# Patient Record
Sex: Male | Born: 1935 | Race: White | Hispanic: No | Marital: Married | Smoking: Never smoker
Health system: Southern US, Community
[De-identification: ages and names within clinical notes are randomized; demographics above are authoritative.]

## PROBLEM LIST (undated history)

## (undated) DIAGNOSIS — E119 Type 2 diabetes mellitus without complications: Secondary | ICD-10-CM

## (undated) DIAGNOSIS — I10 Essential (primary) hypertension: Secondary | ICD-10-CM

## (undated) DIAGNOSIS — F0390 Unspecified dementia without behavioral disturbance: Secondary | ICD-10-CM

## (undated) DIAGNOSIS — I639 Cerebral infarction, unspecified: Secondary | ICD-10-CM

## (undated) HISTORY — PX: OTHER SURGICAL HISTORY: SHX169

---

## 2018-01-14 ENCOUNTER — Encounter: Payer: Self-pay | Admitting: Podiatry

## 2018-01-14 ENCOUNTER — Ambulatory Visit (INDEPENDENT_AMBULATORY_CARE_PROVIDER_SITE_OTHER): Payer: Medicare Other | Admitting: Podiatry

## 2018-01-14 VITALS — BP 155/78 | HR 81

## 2018-01-14 DIAGNOSIS — M79675 Pain in left toe(s): Secondary | ICD-10-CM

## 2018-01-14 DIAGNOSIS — E119 Type 2 diabetes mellitus without complications: Secondary | ICD-10-CM | POA: Diagnosis not present

## 2018-01-14 DIAGNOSIS — M79674 Pain in right toe(s): Secondary | ICD-10-CM | POA: Diagnosis not present

## 2018-01-14 DIAGNOSIS — B351 Tinea unguium: Secondary | ICD-10-CM | POA: Diagnosis not present

## 2018-01-14 NOTE — Progress Notes (Signed)
This patient presents to the office with chief complaint of long thick nails and diabetic feet.  This patient  says there  is  no pain and discomfort in their feet.  This patient says there are long thick painful nails.  These nails are painful walking and wearing shoes.  Patient has no history of infection or drainage from both feet.  Patient is unable to  self treat his own nails . This patient presents  to the office today for treatment of the  long nails and a foot evaluation due to history of  diabetes.  General Appearance  Alert, conversant and in no acute stress.  Vascular  Dorsalis pedis and posterior tibial  pulses are palpable  bilaterally.  Capillary return is within normal limits  bilaterally. Temperature is within normal limits  bilaterally.  Neurologic  Senn-Weinstein monofilament wire test within normal limits  bilaterally. Muscle power within normal limits bilaterally.  Nails Thick disfigured discolored nails with subungual debris  from hallux to fifth toes bilaterally. No evidence of bacterial infection or drainage bilaterally.  Orthopedic  No limitations of motion of motion feet .  No crepitus or effusions noted.  No bony pathology or digital deformities noted.  Skin  normotropic skin with no porokeratosis noted bilaterally.  No signs of infections or ulcers noted.     Onychomycosis  Diabetes with no foot complications  IE  Debride nails x 10.  A diabetic foot exam was performed and there is no evidence of any vascular or neurologic pathology.   RTC 10 weeks   Gardiner Barefoot DPM

## 2018-03-25 ENCOUNTER — Ambulatory Visit: Payer: 59 | Admitting: Podiatry

## 2018-04-10 ENCOUNTER — Observation Stay
Admission: EM | Admit: 2018-04-10 | Discharge: 2018-04-11 | Disposition: A | Discharge status: Home / Self Care | Payer: Medicare Other | Attending: Internal Medicine | Admitting: Internal Medicine

## 2018-04-10 ENCOUNTER — Emergency Department: Payer: Medicare Other

## 2018-04-10 ENCOUNTER — Other Ambulatory Visit: Payer: Self-pay

## 2018-04-10 ENCOUNTER — Observation Stay: Payer: Medicare Other

## 2018-04-10 DIAGNOSIS — E785 Hyperlipidemia, unspecified: Secondary | ICD-10-CM | POA: Insufficient documentation

## 2018-04-10 DIAGNOSIS — G459 Transient cerebral ischemic attack, unspecified: Secondary | ICD-10-CM | POA: Diagnosis not present

## 2018-04-10 DIAGNOSIS — I10 Essential (primary) hypertension: Secondary | ICD-10-CM | POA: Diagnosis not present

## 2018-04-10 DIAGNOSIS — G319 Degenerative disease of nervous system, unspecified: Secondary | ICD-10-CM | POA: Insufficient documentation

## 2018-04-10 DIAGNOSIS — Z66 Do not resuscitate: Secondary | ICD-10-CM | POA: Diagnosis not present

## 2018-04-10 DIAGNOSIS — Z79899 Other long term (current) drug therapy: Secondary | ICD-10-CM | POA: Diagnosis not present

## 2018-04-10 DIAGNOSIS — E119 Type 2 diabetes mellitus without complications: Secondary | ICD-10-CM | POA: Insufficient documentation

## 2018-04-10 DIAGNOSIS — F0390 Unspecified dementia without behavioral disturbance: Secondary | ICD-10-CM | POA: Diagnosis not present

## 2018-04-10 DIAGNOSIS — Z7982 Long term (current) use of aspirin: Secondary | ICD-10-CM | POA: Diagnosis not present

## 2018-04-10 DIAGNOSIS — R262 Difficulty in walking, not elsewhere classified: Secondary | ICD-10-CM | POA: Diagnosis not present

## 2018-04-10 DIAGNOSIS — I350 Nonrheumatic aortic (valve) stenosis: Secondary | ICD-10-CM | POA: Diagnosis not present

## 2018-04-10 DIAGNOSIS — Z794 Long term (current) use of insulin: Secondary | ICD-10-CM | POA: Diagnosis not present

## 2018-04-10 HISTORY — DX: Essential (primary) hypertension: I10

## 2018-04-10 HISTORY — DX: Type 2 diabetes mellitus without complications: E11.9

## 2018-04-10 HISTORY — DX: Unspecified dementia, unspecified severity, without behavioral disturbance, psychotic disturbance, mood disturbance, and anxiety: F03.90

## 2018-04-10 LAB — GLUCOSE, CAPILLARY
GLUCOSE-CAPILLARY: 103 mg/dL — AB (ref 70–99)
GLUCOSE-CAPILLARY: 116 mg/dL — AB (ref 70–99)
GLUCOSE-CAPILLARY: 132 mg/dL — AB (ref 70–99)
Glucose-Capillary: 171 mg/dL — ABNORMAL HIGH (ref 70–99)

## 2018-04-10 LAB — COMPREHENSIVE METABOLIC PANEL
ALT: 15 U/L (ref 0–44)
ANION GAP: 12 (ref 5–15)
AST: 27 U/L (ref 15–41)
Albumin: 4.1 g/dL (ref 3.5–5.0)
Alkaline Phosphatase: 77 U/L (ref 38–126)
BUN: 24 mg/dL — AB (ref 8–23)
CHLORIDE: 101 mmol/L (ref 98–111)
CO2: 26 mmol/L (ref 22–32)
Calcium: 9.1 mg/dL (ref 8.9–10.3)
Creatinine, Ser: 1.72 mg/dL — ABNORMAL HIGH (ref 0.61–1.24)
GFR calc Af Amer: 41 mL/min — ABNORMAL LOW (ref >60)
GFR, EST NON AFRICAN AMERICAN: 35 mL/min — AB (ref >60)
Glucose, Bld: 117 mg/dL — ABNORMAL HIGH (ref 70–99)
POTASSIUM: 4.2 mmol/L (ref 3.5–5.1)
Sodium: 139 mmol/L (ref 135–145)
Total Bilirubin: 0.5 mg/dL (ref 0.3–1.2)
Total Protein: 7.1 g/dL (ref 6.5–8.1)

## 2018-04-10 LAB — PROTIME-INR
INR: 0.91
Prothrombin Time: 12.2 seconds (ref 11.4–15.2)

## 2018-04-10 LAB — DIFFERENTIAL
BASOS ABS: 0.1 10*3/uL (ref 0–0.1)
BASOS PCT: 1 %
EOS ABS: 0.4 10*3/uL (ref 0–0.7)
EOS PCT: 4 %
Lymphocytes Relative: 24 %
Lymphs Abs: 2.2 10*3/uL (ref 1.0–3.6)
MONOS PCT: 8 %
Monocytes Absolute: 0.8 10*3/uL (ref 0.2–1.0)
Neutro Abs: 5.9 10*3/uL (ref 1.4–6.5)
Neutrophils Relative %: 63 %

## 2018-04-10 LAB — CBC
HEMATOCRIT: 44.5 % (ref 40.0–52.0)
Hemoglobin: 15.3 g/dL (ref 13.0–18.0)
MCH: 30.3 pg (ref 26.0–34.0)
MCHC: 34.4 g/dL (ref 32.0–36.0)
MCV: 88.1 fL (ref 80.0–100.0)
Platelets: 278 10*3/uL (ref 150–440)
RBC: 5.05 MIL/uL (ref 4.40–5.90)
RDW: 14.5 % (ref 11.5–14.5)
WBC: 9.3 10*3/uL (ref 3.8–10.6)

## 2018-04-10 LAB — APTT: APTT: 32 s (ref 24–36)

## 2018-04-10 LAB — TROPONIN I

## 2018-04-10 MED ORDER — ASPIRIN 300 MG RE SUPP: 300.0000 mg | Freq: Every day | RECTAL | Status: DC

## 2018-04-10 MED ORDER — ACETAMINOPHEN 160 MG/5ML PO SOLN
650.0000 mg | ORAL | Status: DC | PRN
  Filled 2018-04-10: qty 20.3

## 2018-04-10 MED ORDER — DONEPEZIL HCL 5 MG PO TABS
10.0000 mg | ORAL_TABLET | Freq: Every day | ORAL | Status: DC
  Administered 2018-04-10: 10 mg via ORAL
  Filled 2018-04-10 (×2): qty 2

## 2018-04-10 MED ORDER — INSULIN ASPART 100 UNIT/ML ~~LOC~~ SOLN
0.0000 [IU] | Freq: Three times a day (TID) | SUBCUTANEOUS | Status: DC
  Administered 2018-04-10 – 2018-04-11 (×2): 2 [IU] via SUBCUTANEOUS
  Filled 2018-04-10 (×2): qty 1

## 2018-04-10 MED ORDER — ASPIRIN 325 MG PO TABS
325.0000 mg | ORAL_TABLET | Freq: Every day | ORAL | Status: DC
  Administered 2018-04-11: 08:00:00 325 mg via ORAL
  Filled 2018-04-10: qty 1

## 2018-04-10 MED ORDER — ACETAMINOPHEN 325 MG PO TABS: 650.0000 mg | ORAL_TABLET | ORAL | Status: DC | PRN

## 2018-04-10 MED ORDER — ACETAMINOPHEN 650 MG RE SUPP: 650.0000 mg | RECTAL | Status: DC | PRN

## 2018-04-10 MED ORDER — ASPIRIN 81 MG PO CHEW
324.0000 mg | CHEWABLE_TABLET | Freq: Once | ORAL | Status: AC
  Administered 2018-04-10: 324 mg via ORAL
  Filled 2018-04-10: qty 4

## 2018-04-10 MED ORDER — STROKE: EARLY STAGES OF RECOVERY BOOK
Freq: Once | Status: AC
  Administered 2018-04-10: 19:00:00

## 2018-04-10 MED ORDER — ENOXAPARIN SODIUM 40 MG/0.4ML ~~LOC~~ SOLN
40.0000 mg | SUBCUTANEOUS | Status: DC
  Administered 2018-04-10: 20:00:00 40 mg via SUBCUTANEOUS
  Filled 2018-04-10: qty 0.4

## 2018-04-10 MED ORDER — ESCITALOPRAM OXALATE 10 MG PO TABS
5.0000 mg | ORAL_TABLET | Freq: Every day | ORAL | Status: DC
  Administered 2018-04-10 – 2018-04-11 (×2): 5 mg via ORAL
  Filled 2018-04-10 (×3): qty 0.5

## 2018-04-10 MED ORDER — SIMVASTATIN 10 MG PO TABS
20.0000 mg | ORAL_TABLET | Freq: Every day | ORAL | Status: DC
  Administered 2018-04-10 – 2018-04-11 (×2): 20 mg via ORAL
  Filled 2018-04-10 (×2): qty 2

## 2018-04-10 NOTE — ED Triage Notes (Addendum)
Family member states that when he out of car R sided facial droop,slurred speech, R leg dragging, and unsteady on feet. States called EMS but brought in by family. States symptoms lasted about 8 minutes. No symptoms noted at this time. Speech clear. Pt is HOH. Sometimes takes a few seconds to respond to questions. bilat grip strength equal. No facial droop noted. No arm or leg drift noted.

## 2018-04-10 NOTE — ED Notes (Signed)
Code stroke called at this time by this RN.

## 2018-04-10 NOTE — Consult Note (Signed)
   [facility] Tele[facility]urology Consult Services    Date of Service:   04/10/2018 15:34:34  Impression:      .  RO Acute Ischemic Stroke     .  Clinically TIA but need to r/o stroke  Comments: Etiology of TIA is unclear at this point. Could be either Small vessel or cardioembolic.  Mechanism of Stroke: Possible Thromboembolic Possible Cardioembolic Not Clear  Metrics: Last Known Well: 04/10/2018 13:30:00 [facility] Notification Time: 04/10/2018 15:34:34 Arrival Time: 04/10/2018 15:19:00 Stamp Time: 04/10/2018 15:34:34 Time First Login Attempt: 04/10/2018 15:42:00 Video Start Time: 04/10/2018 15:42:00  Symptoms: right sided weakness NIHSS Start Assessment Time: 04/10/2018 15:42:00 Patient is not a candidate for tPA. Patient was not deemed candidate for tPA thrombolytics because of nihss 0, rapidly improving, resolving. . Video End Time: 04/10/2018 16:00:00  CT head showed no acute hemorrhage or acute core infarct. CT head was reviewed.  Advanced imaging was reviewed.  ER physician notified of the decision on thrombolytics management.  Our recommendations are outlined below.  Recommendations:     .  Antiplatelet Therapy Recommended  Routine Consultation with [facility] for Follow up Care  Sign Out:     .  Discussed with Emergency Department Provider    ------------------------------------------------------------------------------  History of Present Illness:  Patient is a 84 years old Male.  Patient was brought by EMS for symptoms of right sided weakness  84 yo M with h/o HTN HLD DM Dementia p/w transient right sided weakness. Patient was with family when they noted at 1330 symptoms of right facial droop, right arm weakness and dragging right leg. EMS called and symptoms resolved priro to their arrival. Family decided to bring to tED for evaluation. He has some mild slurred speech but son at bedside said this is normal and that he is currently  behaving like normal.   CT head showed no acute hemorrhage or acute core infarct. CT head was reviewed.    Examination:  1A: Level of Consciousness - Alert; keenly responsive + 0 1B: Ask Month and Age - Both Questions Right + 0 1C: Blink Eyes & Squeeze Hands - Performs Both Tasks + 0 2: Test Horizontal Extraocular Movements - Normal + 0 3: Test Visual Fields - No Visual Loss + 0 4: Test Facial Palsy (Use Grimace if Obtunded) - Normal symmetry + 0 5A: Test Left Arm Motor Drift - No Drift for 10 Seconds + 0 5B: Test Right Arm Motor Drift - No Drift for 10 Seconds + 0 6A: Test Left Leg Motor Drift - No Drift for 5 Seconds + 0 6B: Test Right Leg Motor Drift - No Drift for 5 Seconds + 0 7: Test Limb Ataxia (FNF/Heel-Shin) - No Ataxia + 0 8: Test Sensation - Normal; No sensory loss + 0 9: Test Language/Aphasia - Normal; No aphasia + 0 10: Test Dysarthria - Normal + 0 11: Test Extinction/Inattention - No abnormality + 0  NIHSS Score: 0  Patient was informed the Neurology Consult would happen via TeleHealth consult by way of interactive audio and video telecommunications and consented to receiving care in this manner.  Due to the immediate potential for life-threatening deterioration due to underlying acute neurologic illness, I spent 35 minutes providing critical care. This time includes time for face to face visit via telemedicine, review of medical records, imaging studies and discussion of findings with providers, the patient and/or family.   Dr Yetta Barre   [facility] [phone]

## 2018-04-10 NOTE — H&P (Addendum)
[facility] [employer] NAME: Ian Jimenez    MR#:  [id number]  DATE OF BIRTH:  11/22/1933  DATE OF ADMISSION:  04/10/2018  PRIMARY CARE PHYSICIAN: Ian Billet, MD   REQUESTING/REFERRING PHYSICIAN:   CHIEF COMPLAINT:  Slurred speech  HISTORY OF PRESENT ILLNESS: Ian Jimenez  is a 84 y.o. male with a known history of diabetes mellitus type 2, hypertension, dementia presented to the emergency room with numbness in the right side of the face and slurred speech.  Patient went to a restaurant and had cheeseburger for lunch.  After lunch the patient noticed some numbness in the right upper and lower extremity right side of the face and slurred speech.  EMS was called patient was brought to the emergency room.  By the time patient came to the emergency room his symptoms resolved.  Patient was worked up with CT head which showed chronic microvascular ischemia.  No acute abnormality noted on CT head.  Tele neurology consultation was done who recommended stroke work-up and start patient on aspirin.  PAST MEDICAL HISTORY:   Past Medical History:  Diagnosis Date  . Dementia   . Diabetes mellitus without complication ([place])   . Hypertension     PAST SURGICAL HISTORY:  Past Surgical History:  Procedure Laterality Date  . none      SOCIAL HISTORY:  Social History   Tobacco Use  . Smoking status: Never Smoker  . Smokeless tobacco: Never Used  Substance Use Topics  . Alcohol use: Never    Frequency: Never    FAMILY HISTORY:  Family History  Problem Relation Age of Onset  . Heart disease Father     DRUG ALLERGIES: No Known Allergies  REVIEW OF SYSTEMS:   CONSTITUTIONAL: No fever, fatigue or weakness.  EYES: No blurred or double vision.  EARS, NOSE, AND THROAT: No tinnitus or ear pain.  RESPIRATORY: No cough, shortness of breath, wheezing or hemoptysis.  CARDIOVASCULAR: No chest pain, orthopnea, edema.  GASTROINTESTINAL: No nausea,  vomiting, diarrhea or abdominal pain.  GENITOURINARY: No dysuria, hematuria.  ENDOCRINE: No polyuria, nocturia,  HEMATOLOGY: No anemia, easy bruising or bleeding SKIN: No rash or lesion. MUSCULOSKELETAL: No joint pain or arthritis.   NEUROLOGIC: No tingling,has numbness, no weakness.  Slurred speech PSYCHIATRY: No anxiety or depression.   MEDICATIONS AT HOME:  Prior to Admission medications   Medication Sig Start Date End Date Taking? Authorizing Provider  amLODipine (NORVASC) 5 MG tablet Take 5 mg by mouth daily.   Yes [provider]  aspirin EC 81 MG tablet Take 81 mg by mouth daily.   Yes [provider]  donepezil (ARICEPT) 10 MG tablet Take 10 mg by mouth at bedtime.   Yes [provider]  escitalopram (LEXAPRO) 5 MG tablet Take 5 mg by mouth daily. 03/12/18  Yes [provider]  glipiZIDE (GLUCOTROL) 10 MG tablet Take 10 mg by mouth 2 (two) times daily before a meal.   Yes [provider]  insulin glargine (LANTUS) 100 UNIT/ML injection Inject 35 Units into the skin at bedtime.   Yes [provider]  lisinopril (PRINIVIL,ZESTRIL) 20 MG tablet Take 20 mg by mouth daily. 03/30/18  Yes [provider]  metFORMIN (GLUCOPHAGE) 1000 MG tablet Take 1,000 mg by mouth 2 (two) times daily with a meal.   Yes [provider]  simvastatin (ZOCOR) 20 MG tablet Take 20 mg by mouth daily.   Yes [provider]  PHYSICAL EXAMINATION:   VITAL SIGNS: Blood pressure (!) 143/69, pulse 79, temperature 98.8 F (37.1 C), temperature source Oral, resp. rate 16, height 5\' 8"  (1.727 m), weight 79.4 kg, SpO2 94 %.  GENERAL:  84 y.o.-year-old patient lying in the bed with no acute distress.  EYES: Pupils equal, round, reactive to light and accommodation. No scleral icterus. Extraocular muscles intact.  HEENT: Head atraumatic, normocephalic. Oropharynx and nasopharynx clear.  NECK:  Supple, no jugular venous distention. No  thyroid enlargement, no tenderness.  LUNGS: Normal breath sounds bilaterally, no wheezing, rales,rhonchi or crepitation. No use of accessory muscles of respiration.  CARDIOVASCULAR: S1, S2 normal. No murmurs, rubs, or gallops.  ABDOMEN: Soft, nontender, nondistended. Bowel sounds present. No organomegaly or mass.  EXTREMITIES: No pedal edema, cyanosis, or clubbing.  NEUROLOGIC: Cranial nerves II through XII are intact. Muscle strength 5/5 in all extremities. Sensation intact. Gait not checked.  PSYCHIATRIC: The patient is alert and oriented x 3.  SKIN: No obvious rash, lesion, or ulcer.   LABORATORY PANEL:   CBC Recent Labs  Lab 04/10/18 1537  WBC 9.3  HGB 15.3  HCT 44.5  PLT 278  MCV 88.1  MCH 30.3  MCHC 34.4  RDW 14.5  LYMPHSABS 2.2  MONOABS 0.8  EOSABS 0.4  BASOSABS 0.1   ------------------------------------------------------------------------------------------------------------------  Chemistries  Recent Labs  Lab 04/10/18 1537  NA 139  K 4.2  CL 101  CO2 26  GLUCOSE 117*  BUN 24*  CREATININE 1.72*  CALCIUM 9.1  AST 27  ALT 15  ALKPHOS 77  BILITOT 0.5   ------------------------------------------------------------------------------------------------------------------ estimated creatinine clearance is 32 mL/min (A) (by C-G formula based on SCr of 1.72 mg/dL (H)). ------------------------------------------------------------------------------------------------------------------ No results for input(s): TSH, T4TOTAL, T3FREE, THYROIDAB in the last 72 hours.  Invalid input(s): FREET3   Coagulation profile Recent Labs  Lab 04/10/18 1537  INR 0.91   ------------------------------------------------------------------------------------------------------------------- No results for input(s): DDIMER in the last 72 hours. -------------------------------------------------------------------------------------------------------------------  Cardiac Enzymes Recent  Labs  Lab 04/10/18 1537  TROPONINI <0.03   ------------------------------------------------------------------------------------------------------------------ Invalid input(s): POCBNP  ---------------------------------------------------------------------------------------------------------------  Urinalysis No results found for: COLORURINE, APPEARANCEUR, LABSPEC, PHURINE, GLUCOSEU, HGBUR, BILIRUBINUR, KETONESUR, PROTEINUR, UROBILINOGEN, NITRITE, LEUKOCYTESUR   RADIOLOGY: Ct Head Code Stroke Wo Contrast  Result Date: 04/10/2018 CLINICAL DATA:  Code stroke. Acute onset of right-sided facial droop and slurred speech. Right lower extremity weakness. Episode lasted 8 minutes. Patient has returned to normal. EXAM: CT HEAD WITHOUT CONTRAST TECHNIQUE: Contiguous axial images were obtained from the base of the skull through the vertex without intravenous contrast. COMPARISON:  None. FINDINGS: Brain: Atrophy and moderate diffuse white matter hypoattenuation is present bilaterally. Basal ganglia are intact. Insular ribbon is normal. No acute or focal cortical abnormalities are present. There is no significant extra-axial fluid collection. The brainstem and cerebellum are normal. Vascular: Atherosclerotic calcifications present within the cavernous internal carotid arteries bilaterally. There is no hyperdense vessel. Skull: The calvarium is intact. No focal lytic or blastic lesions are present. Sinuses/Orbits: Scattered ethmoid air cells are opacified. The paranasal sinuses and mastoid air cells are otherwise clear. The globes and orbits are within normal limits. ASPECTS [facility] Stroke Program Early CT Score) - Ganglionic level infarction (caudate, lentiform nuclei, internal capsule, insula, M1-M3 cortex): 7/7 - Supraganglionic infarction (M4-M6 cortex): 3/3 Total score (0-10 with 10 being normal): 10/10 IMPRESSION: 1. No acute intracranial abnormality. No acute or focal cortical infarcts. 2. Atrophy with  moderate diffuse white matter disease. This likely reflects the sequela of chronic microvascular ischemia. 3.  ASPECTS is 10/10 These results were called by telephone at the time of interpretation on 04/10/2018 at 3:40 pm to Dr. Lenise Arena , who verbally acknowledged these results. Electronically Signed   By: San Morelle M.D.   On: 04/10/2018 15:42    EKG: Orders placed or performed during the hospital encounter of 04/10/18  . ED EKG  . ED EKG  . EKG 12-Lead  . EKG 12-Lead  . EKG 12-Lead  . EKG 12-Lead  . EKG 12-Lead  . EKG 12-Lead  . EKG 12-Lead  . EKG 12-Lead  . EKG 12-Lead  . EKG 12-Lead    IMPRESSION AND PLAN:  84 year old male patient with history of hypertension, dementia, type 2 diabetes mellitus presented to the emergency room with numbness, slurred speech  -Transient ischemic attack Admit patient to stroke unit observation bed Aspirin 325 mg daily Check MRI and MRA brain Carotid ultrasound and echocardiogram Neurology consultation Speech therapy and Physical therapy consultation  -Dementia Supportive care  -Type 2 diabetes mellitus Sliding scale coverage with insulin once patient passes swallow study Diabetic diet once patient passes swallow study  -DVT prophylaxis subcu Lovenox daily  All the records are reviewed and case discussed with ED provider. Management plans discussed with the patient, family and they are in agreement.  CODE STATUS:DNR    Code Status Orders  (From admission, onward)         Start     Ordered   04/10/18 1621  Full code  Continuous     04/10/18 1620        Code Status History    This patient has a current code status but no historical code status.       TOTAL TIME TAKING CARE OF THIS PATIENT: 53 minutes.    Saundra Shelling M.D on 04/10/2018 at 4:33 PM  Between 7am to 6pm - Pager - [phone]  After 6pm go to [email or web address] - password [email or web address] [facility]  [facility]  Office   [phone]  CC: Primary care physician; Ian Billet, MD

## 2018-04-10 NOTE — Progress Notes (Signed)
CODE STROKE- PHARMACY COMMUNICATION   Time CODE STROKE called/page received: 1529  Time response to CODE STROKE was made (in person or via phone): 1547 on phone; med rec already complete  Time Stroke Kit retrieved from [place] (only if needed):n/a  Name of Provider/Nurse contacted: Belenda Cruise [id number]  Past Medical History:  Diagnosis Date  . Dementia   . Diabetes mellitus without complication ([place])   . Hypertension    Prior to Admission medications   Medication Sig Start Date End Date Taking? Authorizing Provider  amLODipine (NORVASC) 5 MG tablet Take 5 mg by mouth daily.   Yes [provider]  aspirin EC 81 MG tablet Take 81 mg by mouth daily.   Yes [provider]  donepezil (ARICEPT) 10 MG tablet Take 10 mg by mouth at bedtime.   Yes [provider]  escitalopram (LEXAPRO) 5 MG tablet Take 5 mg by mouth daily. 03/12/18  Yes [provider]  glipiZIDE (GLUCOTROL) 10 MG tablet Take 10 mg by mouth 2 (two) times daily before a meal.   Yes [provider]  insulin glargine (LANTUS) 100 UNIT/ML injection Inject 35 Units into the skin at bedtime.   Yes [provider]  lisinopril (PRINIVIL,ZESTRIL) 20 MG tablet Take 20 mg by mouth daily. 03/30/18  Yes [provider]  metFORMIN (GLUCOPHAGE) 1000 MG tablet Take 1,000 mg by mouth 2 (two) times daily with a meal.   Yes [provider]  simvastatin (ZOCOR) 20 MG tablet Take 20 mg by mouth daily.   Yes [provider]  lisinopril-hydrochlorothiazide (PRINZIDE,ZESTORETIC) 20-12.5 MG tablet Take 1 tablet by mouth daily.  04/10/18  [provider]    Rocky Morel ,PharmD Clinical Pharmacist  04/10/2018  3:47 PM

## 2018-04-10 NOTE — Progress Notes (Signed)
Advanced care plan.  Purpose of the Encounter: CODE STATUS  Parties in Attendance: Patient and family  Patient's Decision Capacity: Good  Subjective/Patient's story: Presented to the emergency room for slurred speech and numbness in the face and in the extremities   Objective/Medical story Patient has transient ischemic attack Needs stroke work-up   Goals of care determination:  Advance care directives goals of care discussed with the patient and family Patient and family do not want any cardiac resuscitation, intubation and ventilator if the need arises   CODE STATUS: DNR   Time spent discussing advanced care planning: 16 minutes

## 2018-04-10 NOTE — ED Notes (Signed)
Pt taken to CT at this time.

## 2018-04-10 NOTE — ED Provider Notes (Signed)
[facility] Emergency Department Provider Note   ____________________________________________    I have reviewed the triage vital signs and the nursing notes.   HISTORY  Chief Complaint Weakness    HPI Ian Jimenez is a 84 y.o. male with a history of dementia, diabetes and hypertension who presents today after an episode of weakness.  Apparently the patient got out of the car with family today while running errands and had right-sided facial droop slurred speech with the right leg dragging and quite unsteady on the feet.  Brought in by family, upon arrival to the emergency department symptoms had resolved.  Patient reports he feels well, denies headache.  No trauma or falls.  No history of stroke.  Past Medical History:  Diagnosis Date  . Dementia   . Diabetes mellitus without complication ([place])   . Hypertension     Patient Active Problem List   Diagnosis Date Noted  . TIA (transient ischemic attack) 04/10/2018    No past surgical history on file.  Prior to Admission medications   Medication Sig Start Date End Date Taking? Authorizing Provider  amLODipine (NORVASC) 5 MG tablet Take 5 mg by mouth daily.   Yes [provider]  aspirin EC 81 MG tablet Take 81 mg by mouth daily.   Yes [provider]  donepezil (ARICEPT) 10 MG tablet Take 10 mg by mouth at bedtime.   Yes [provider]  escitalopram (LEXAPRO) 5 MG tablet Take 5 mg by mouth daily. 03/12/18  Yes [provider]  glipiZIDE (GLUCOTROL) 10 MG tablet Take 10 mg by mouth 2 (two) times daily before a meal.   Yes [provider]  insulin glargine (LANTUS) 100 UNIT/ML injection Inject 35 Units into the skin at bedtime.   Yes [provider]  lisinopril (PRINIVIL,ZESTRIL) 20 MG tablet Take 20 mg by mouth daily. 03/30/18  Yes [provider]  metFORMIN (GLUCOPHAGE) 1000 MG tablet Take 1,000 mg by mouth 2 (two) times daily with a meal.    Yes [provider]  simvastatin (ZOCOR) 20 MG tablet Take 20 mg by mouth daily.   Yes [provider]     Allergies Patient has no known allergies.  No family history on file.  Social History Social History   Tobacco Use  . Smoking status: Never Smoker  . Smokeless tobacco: Never Used  Substance Use Topics  . Alcohol use: Never    Frequency: Never  . Drug use: Never    Review of Systems  Constitutional: No dizziness Eyes: No visual changes.  ENT: No sore throat. Cardiovascular: Denies chest pain. Respiratory: Denies shortness of breath. Gastrointestinal: No abdominal pain.  No nausea, no vomiting.   Genitourinary: Negative for dysuria. Musculoskeletal: Negative for back pain. Skin: Negative for rash. Neurological: As above   ____________________________________________   PHYSICAL EXAM:  VITAL SIGNS: ED Triage Vitals  Enc Vitals Group     BP 04/10/18 1520 (!) 137/57     Pulse Rate 04/10/18 1520 95     Resp 04/10/18 1520 16     Temp 04/10/18 1520 98.8 F (37.1 C)     Temp Source 04/10/18 1520 Oral     SpO2 04/10/18 1520 95 %     Weight 04/10/18 1523 79.4 kg (175 lb)     Height 04/10/18 1523 1.727 m (5\' 8" )     Head Circumference --      Peak Flow --      Pain Score 04/10/18  1521 0     Pain Loc --      Pain Edu? --      Excl. in [place]? --     Constitutional: Alert and oriented.  Hard of hearing Eyes: Conjunctivae are normal.  PERRLA Head: Atraumatic.    Neck:  Painless ROM Cardiovascular: Normal rate, regular rhythm. Grossly normal heart sounds.  Good peripheral circulation. Respiratory: Normal respiratory effort.  No retractions.  Gastrointestinal: Soft and nontender. No distention.  Musculoskeletal: No lower extremity tenderness nor edema.  Warm and well perfused, strength appears normal in all extremities Neurologic:  Normal speech and language. No gross focal neurologic deficits are appreciated.  Cranial nerves II through XII are  normal Skin:  Skin is warm, dry and intact. No rash noted. Psychiatric: Mood and affect are normal. Speech and behavior are normal.  ____________________________________________   LABS (all labs ordered are listed, but only abnormal results are displayed)  Labs Reviewed  COMPREHENSIVE METABOLIC PANEL - Abnormal; Notable for the following components:      Result Value   Glucose, Bld 117 (*)    BUN 24 (*)    Creatinine, Ser 1.72 (*)    GFR calc non Af Amer 35 (*)    GFR calc Af Amer 41 (*)    All other components within normal limits  GLUCOSE, CAPILLARY - Abnormal; Notable for the following components:   Glucose-Capillary 103 (*)    All other components within normal limits  GLUCOSE, CAPILLARY - Abnormal; Notable for the following components:   Glucose-Capillary 116 (*)    All other components within normal limits  PROTIME-INR  APTT  CBC  DIFFERENTIAL  TROPONIN I  CBC  CREATININE, SERUM  CBG MONITORING, ED   ____________________________________________  EKG  ED ECG REPORT I, Lavonia Drafts, the attending physician, personally viewed and interpreted this ECG.  Date: 04/10/2018  Rhythm: normal sinus rhythm QRS Axis: normal Intervals: PVCs ST/T Wave abnormalities: None specific changes Narrative Interpretation: no evidence of acute ischemia  ____________________________________________  RADIOLOGY  Ct head, negative for acute abnormalities ____________________________________________   PROCEDURES  Procedure(s) performed: No  Procedures   Critical Care performed: No ____________________________________________   INITIAL IMPRESSION / ASSESSMENT AND PLAN / ED COURSE  Pertinent labs & imaging results that were available during my care of the patient were reviewed by me and considered in my medical decision making (see chart for details).  Patient presents with symptoms of as above, concerning for TIA given resolution of symptoms.  RN called code stroke  while I was in the next room.  Tele-neurologist recommends no TPA given resolution of symptoms, aspirin and admission  Reexamined by me, remains well-appearing with no focal neuro deficits.  Discussed with Dr. Estanislado Pandy of the hospitalist service    ____________________________________________   FINAL CLINICAL IMPRESSION(S) / ED DIAGNOSES  Final diagnoses:  Transient ischemic attack (TIA)        Note:  This document was prepared using Dragon voice recognition software and may include unintentional dictation errors.    Lavonia Drafts, MD 04/10/18 [id number]

## 2018-04-11 ENCOUNTER — Observation Stay (HOSPITAL_BASED_OUTPATIENT_CLINIC_OR_DEPARTMENT_OTHER)
Admit: 2018-04-11 | Discharge: 2018-04-11 | Disposition: A | Discharge status: Home / Self Care | Payer: Medicare Other | Attending: Internal Medicine | Admitting: Internal Medicine

## 2018-04-11 DIAGNOSIS — G459 Transient cerebral ischemic attack, unspecified: Secondary | ICD-10-CM | POA: Diagnosis not present

## 2018-04-11 DIAGNOSIS — I350 Nonrheumatic aortic (valve) stenosis: Secondary | ICD-10-CM | POA: Diagnosis not present

## 2018-04-11 LAB — LIPID PANEL
CHOLESTEROL: 189 mg/dL (ref 0–200)
HDL: 36 mg/dL — ABNORMAL LOW (ref >40)
LDL Cholesterol: 116 mg/dL — ABNORMAL HIGH (ref 0–99)
Total CHOL/HDL Ratio: 5.3 RATIO
Triglycerides: 186 mg/dL — ABNORMAL HIGH (ref <150)
VLDL: 37 mg/dL (ref 0–40)

## 2018-04-11 LAB — GLUCOSE, CAPILLARY: Glucose-Capillary: 130 mg/dL — ABNORMAL HIGH (ref 70–99)

## 2018-04-11 MED ORDER — GLIPIZIDE 5 MG PO TABS: 5.0000 mg | ORAL_TABLET | Freq: Two times a day (BID) | ORAL | 1 refills | Status: DC

## 2018-04-11 MED ORDER — CLOPIDOGREL BISULFATE 75 MG PO TABS: 75.0000 mg | ORAL_TABLET | Freq: Every day | ORAL | 3 refills | Status: AC

## 2018-04-11 NOTE — Discharge Summary (Signed)
[facility] NAME: Bryn Perkin    MR#:  [id number]  DATE OF BIRTH:  [birth year]  DATE OF ADMISSION:  04/10/2018   ADMITTING PHYSICIAN: Saundra Shelling, MD  DATE OF DISCHARGE: 04/11/2018 12:15 PM  PRIMARY CARE PHYSICIAN: Albina Billet, MD   ADMISSION DIAGNOSIS:   TIA (transient ischemic attack) [G45.9] Transient ischemic attack (TIA) [G45.9]  DISCHARGE DIAGNOSIS:   Active Problems:   TIA (transient ischemic attack)   SECONDARY DIAGNOSIS:   Past Medical History:  Diagnosis Date  . Dementia   . Diabetes mellitus without complication ([place])   . Hypertension     HOSPITAL COURSE:   84 year old male with past medical history significant for dementia, diabetes and hypertension presents to hospital secondary to right-sided tingling numbness and slurred speech.  1.  Transient ischemic attack-presented with right-sided paresthesias, resolved completely. -Was on aspirin at home.  Will be discharged on Plavix and statin. -MRI of the brain negative for acute infarcts, has moderate small vessel ischemic disease changes and cerebral atrophy.  Prior cerebellar infarcts.  Possible moderate to severe left M2 division origin stenosis. -Carotid Dopplers with no hemodynamically significant stenosis. -Echo with bubble study done and pending -Physical therapy recommended home health at this time.  2.  Hypertension-continue Norvasc and lisinopril  3.  Diabetes mellitus-on metformin.  Sugars have not been high, discontinue Lantus and decrease the dose of glipizide at home.  4.  Hyperlipidemia-continue statin  5.  Dementia-pleasantly confused.  Continue Aricept at home  Family updated.  DISCHARGE CONDITIONS:   Guarded  CONSULTS OBTAINED:   None  DRUG ALLERGIES:   No Known Allergies DISCHARGE MEDICATIONS:   Allergies as of 04/11/2018   No Known Allergies     Medication List    STOP taking these medications   aspirin EC 81 MG  tablet   LANTUS 100 UNIT/ML injection Generic drug:  insulin glargine     TAKE these medications   amLODipine 5 MG tablet Commonly known as:  NORVASC Take 5 mg by mouth daily.   clopidogrel 75 MG tablet Commonly known as:  PLAVIX Take 1 tablet (75 mg total) by mouth daily.   donepezil 10 MG tablet Commonly known as:  ARICEPT Take 10 mg by mouth at bedtime.   escitalopram 5 MG tablet Commonly known as:  LEXAPRO Take 5 mg by mouth daily.   glipiZIDE 5 MG tablet Commonly known as:  GLUCOTROL Take 1 tablet (5 mg total) by mouth 2 (two) times daily before a meal. What changed:    medication strength  how much to take   lisinopril 20 MG tablet Commonly known as:  PRINIVIL,ZESTRIL Take 20 mg by mouth daily.   metFORMIN 1000 MG tablet Commonly known as:  GLUCOPHAGE Take 1,000 mg by mouth 2 (two) times daily with a meal.   simvastatin 20 MG tablet Commonly known as:  ZOCOR Take 20 mg by mouth daily.        DISCHARGE INSTRUCTIONS:   1.  PCP follow-up in 1 to 2 weeks  DIET:   Cardiac diet  ACTIVITY:   Activity as tolerated  OXYGEN:   Home Oxygen: No.  Oxygen Delivery: room air  DISCHARGE LOCATION:   home -patient is from [facility] facility    If you experience worsening of your admission symptoms, develop shortness of breath, life threatening emergency, suicidal or homicidal thoughts you must seek medical attention immediately by calling 911 or calling your  MD immediately  if symptoms less severe.  You Must read complete instructions/literature along with all the possible adverse reactions/side effects for all the Medicines you take and that have been prescribed to you. Take any new Medicines after you have completely understood and accpet all the possible adverse reactions/side effects.   Please note  You were cared for by a hospitalist during your hospital stay. If you have any questions about your discharge medications  or the care you received while you were in the hospital after you are discharged, you can call the unit and asked to speak with the hospitalist on call if the hospitalist that took care of you is not available. Once you are discharged, your primary care physician will handle any further medical issues. Please note that NO REFILLS for any discharge medications will be authorized once you are discharged, as it is imperative that you return to your primary care physician (or establish a relationship with a primary care physician if you do not have one) for your aftercare needs so that they can reassess your need for medications and monitor your lab values.    On the day of Discharge:  VITAL SIGNS:   Blood pressure (!) 174/78, pulse 73, temperature 97.7 F (36.5 C), temperature source Oral, resp. rate 16, height 5\' 7"  (1.702 m), weight 71.4 kg, SpO2 99 %.  PHYSICAL EXAMINATION:    GENERAL:  84 y.o.-year-old patient lying in the bed with no acute distress.  EYES: Pupils equal, round, reactive to light and accommodation. No scleral icterus. Extraocular muscles intact.  HEENT: Head atraumatic, normocephalic. Oropharynx and nasopharynx clear.  NECK:  Supple, no jugular venous distention. No thyroid enlargement, no tenderness.  LUNGS: Normal breath sounds bilaterally, no wheezing, rales,rhonchi or crepitation. No use of accessory muscles of respiration.  Decreased bibasilar breath sounds CARDIOVASCULAR: S1, S2 normal. No rubs, or gallops.  2/6 systolic murmur is present ABDOMEN: Soft, non-tender, non-distended. Bowel sounds present. No organomegaly or mass.  EXTREMITIES: No pedal edema, cyanosis, or clubbing.  NEUROLOGIC: Cranial nerves II through XII are intact. Muscle strength 5/5 in all extremities. Sensation intact. Gait not checked.  PSYCHIATRIC: The patient is alert and oriented x 2.  SKIN: No obvious rash, lesion, or ulcer.   DATA REVIEW:   CBC Recent Labs  Lab 04/10/18 1537  WBC 9.3    HGB 15.3  HCT 44.5  PLT 278    Chemistries  Recent Labs  Lab 04/10/18 1537  NA 139  K 4.2  CL 101  CO2 26  GLUCOSE 117*  BUN 24*  CREATININE 1.72*  CALCIUM 9.1  AST 27  ALT 15  ALKPHOS 77  BILITOT 0.5     Microbiology Results  No results found for this or any previous visit.  RADIOLOGY:  Mr Brain Wo Contrast  Result Date: 04/10/2018 CLINICAL DATA:  Right upper and lower extremity and facial numbness. Slurred speech. Symptoms have resolved. EXAM: MRI HEAD WITHOUT CONTRAST MRA HEAD WITHOUT CONTRAST TECHNIQUE: Multiplanar, multiecho pulse sequences of the brain and surrounding structures were obtained without intravenous contrast. Angiographic images of the head were obtained using MRA technique without contrast. COMPARISON:  Head CT 04/10/2018 FINDINGS: MRI HEAD FINDINGS Brain: There is no evidence of acute infarct, mass, midline shift, or extra-axial fluid collection. A chronic microhemorrhage is noted in the left cerebellum. A punctate focus of susceptibility in the inferior right thalamus may reflect a chronic microhemorrhage or be vascular. Patchy T2 hyperintensities in the cerebral white matter and pons are  nonspecific but compatible with moderate chronic small vessel ischemic disease. There is mild-to-moderate cerebral atrophy. Small chronic infarcts are noted in the cerebellum. Vascular: Major intracranial vascular flow voids are preserved. Skull and upper cervical spine: No suspicious marrow lesion. Sinuses/Orbits: Unremarkable orbits. Scattered mild paranasal sinus mucosal thickening. Clear mastoid air cells. Other: None. MRA HEAD FINDINGS The visualized distal vertebral arteries are patent to the basilar with the right being dominant. The left vertebral artery is diminutive distal to the PICA origin. Patent SCA origins are present bilaterally. The basilar artery is widely patent. There are large posterior communicating arteries bilaterally with absence of the right P1 segment.  No significant PCA stenosis is evident. The internal carotid arteries are widely patent from skull base to carotid termini with slight ectasia of the left cavernous segment. ACAs and MCAs are patent without evidence of proximal branch occlusion or A1 or M1 stenosis. There is mild artifactual signal loss in the proximal M2 segments bilaterally with a possible moderate to severe stenosis of the left M2 inferior division origin versus artifact. No aneurysm is identified. IMPRESSION: 1. No acute intracranial abnormality. 2. Moderate chronic small vessel ischemic disease and cerebral atrophy. 3. Small chronic cerebellar infarcts. 4. Patent circle of Willis without major branch occlusion or significant large vessel stenosis. Possible moderate to severe left M2 inferior division origin stenosis. Electronically Signed   By: Logan Bores M.D.   On: 04/10/2018 18:39   US Carotid Bilateral (at [facility] Only)  Result Date: 04/10/2018 CLINICAL DATA:  Transient ischemic attack. EXAM: BILATERAL CAROTID DUPLEX ULTRASOUND TECHNIQUE: Pearline Cables scale imaging, color Doppler and duplex ultrasound were performed of bilateral carotid and vertebral arteries in the neck. COMPARISON:  Brain MRI and MRA obtained earlier today. FINDINGS: Criteria: Quantification of carotid stenosis is based on velocity parameters that correlate the residual internal carotid diameter with NASCET-based stenosis levels, using the diameter of the distal internal carotid lumen as the denominator for stenosis measurement. The following velocity measurements were obtained: RIGHT ICA: 72/12 cm/sec CCA: 14/4 cm/sec SYSTOLIC ICA/CCA RATIO:  0.9 ECA: 143 cm/sec LEFT ICA: 102/10 cm/sec CCA: 818/56 cm/sec SYSTOLIC ICA/CCA RATIO:  1.0 ECA: 129 cm/sec RIGHT CAROTID ARTERY: Mild intimal thickening and minimal soft plaque formation in the proximal internal carotid artery, bulb and distal common carotid artery. RIGHT VERTEBRAL ARTERY:  Patent with antegrade flow. LEFT CAROTID  ARTERY: Mild partially calcified plaque formation in the carotid bulb and proximal internal and external carotid arteries. LEFT VERTEBRAL ARTERY:  Patent with antegrade flow. IMPRESSION: Minimal bilateral carotid plaque formation without significant luminal narrowing or flow reduction. Electronically Signed   By: Claudie Revering M.D.   On: 04/10/2018 19:05   Mr Jodene Nam Head/brain DJ Cm  Result Date: 04/10/2018 CLINICAL DATA:  Right upper and lower extremity and facial numbness. Slurred speech. Symptoms have resolved. EXAM: MRI HEAD WITHOUT CONTRAST MRA HEAD WITHOUT CONTRAST TECHNIQUE: Multiplanar, multiecho pulse sequences of the brain and surrounding structures were obtained without intravenous contrast. Angiographic images of the head were obtained using MRA technique without contrast. COMPARISON:  Head CT 04/10/2018 FINDINGS: MRI HEAD FINDINGS Brain: There is no evidence of acute infarct, mass, midline shift, or extra-axial fluid collection. A chronic microhemorrhage is noted in the left cerebellum. A punctate focus of susceptibility in the inferior right thalamus may reflect a chronic microhemorrhage or be vascular. Patchy T2 hyperintensities in the cerebral white matter and pons are nonspecific but compatible with moderate chronic small vessel ischemic disease. There is mild-to-moderate cerebral atrophy. Small  chronic infarcts are noted in the cerebellum. Vascular: Major intracranial vascular flow voids are preserved. Skull and upper cervical spine: No suspicious marrow lesion. Sinuses/Orbits: Unremarkable orbits. Scattered mild paranasal sinus mucosal thickening. Clear mastoid air cells. Other: None. MRA HEAD FINDINGS The visualized distal vertebral arteries are patent to the basilar with the right being dominant. The left vertebral artery is diminutive distal to the PICA origin. Patent SCA origins are present bilaterally. The basilar artery is widely patent. There are large posterior communicating arteries  bilaterally with absence of the right P1 segment. No significant PCA stenosis is evident. The internal carotid arteries are widely patent from skull base to carotid termini with slight ectasia of the left cavernous segment. ACAs and MCAs are patent without evidence of proximal branch occlusion or A1 or M1 stenosis. There is mild artifactual signal loss in the proximal M2 segments bilaterally with a possible moderate to severe stenosis of the left M2 inferior division origin versus artifact. No aneurysm is identified. IMPRESSION: 1. No acute intracranial abnormality. 2. Moderate chronic small vessel ischemic disease and cerebral atrophy. 3. Small chronic cerebellar infarcts. 4. Patent circle of Willis without major branch occlusion or significant large vessel stenosis. Possible moderate to severe left M2 inferior division origin stenosis. Electronically Signed   By: Logan Bores M.D.   On: 04/10/2018 18:39   Ct Head Code Stroke Wo Contrast  Result Date: 04/10/2018 CLINICAL DATA:  Code stroke. Acute onset of right-sided facial droop and slurred speech. Right lower extremity weakness. Episode lasted 8 minutes. Patient has returned to normal. EXAM: CT HEAD WITHOUT CONTRAST TECHNIQUE: Contiguous axial images were obtained from the base of the skull through the vertex without intravenous contrast. COMPARISON:  None. FINDINGS: Brain: Atrophy and moderate diffuse white matter hypoattenuation is present bilaterally. Basal ganglia are intact. Insular ribbon is normal. No acute or focal cortical abnormalities are present. There is no significant extra-axial fluid collection. The brainstem and cerebellum are normal. Vascular: Atherosclerotic calcifications present within the cavernous internal carotid arteries bilaterally. There is no hyperdense vessel. Skull: The calvarium is intact. No focal lytic or blastic lesions are present. Sinuses/Orbits: Scattered ethmoid air cells are opacified. The paranasal sinuses and mastoid  air cells are otherwise clear. The globes and orbits are within normal limits. ASPECTS [facility] Stroke Program Early CT Score) - Ganglionic level infarction (caudate, lentiform nuclei, internal capsule, insula, M1-M3 cortex): 7/7 - Supraganglionic infarction (M4-M6 cortex): 3/3 Total score (0-10 with 10 being normal): 10/10 IMPRESSION: 1. No acute intracranial abnormality. No acute or focal cortical infarcts. 2. Atrophy with moderate diffuse white matter disease. This likely reflects the sequela of chronic microvascular ischemia. 3. ASPECTS is 10/10 These results were called by telephone at the time of interpretation on 04/10/2018 at 3:40 pm to Dr. Lenise Arena , who verbally acknowledged these results. Electronically Signed   By: San Morelle M.D.   On: 04/10/2018 15:42     Management plans discussed with the patient, family and they are in agreement.  CODE STATUS:     Code Status Orders  (From admission, onward)         Start     Ordered   04/10/18 1831  Do not attempt resuscitation (DNR)  Continuous    Question Answer Comment  In the event of cardiac or respiratory ARREST Do not call a "code blue"   In the event of cardiac or respiratory ARREST Do not perform Intubation, CPR, defibrillation or ACLS   In the event of cardiac or  respiratory ARREST Use medication by any route, position, wound care, and other measures to relive pain and suffering. May use oxygen, suction and manual treatment of airway obstruction as needed for comfort.      04/10/18 1830        Code Status History    Date Active Date Inactive Code Status Order ID Comments User Context   04/10/2018 1620 04/10/2018 1830 Full Code [id number]  Saundra Shelling, MD ED    Advance Directive Documentation     Most Recent Value  Type of Advance Directive  Healthcare Power of [facility], Living will  Pre-existing out of facility DNR order (yellow form or pink MOST form)  -  "MOST" Form in Place?  -      TOTAL TIME TAKING  CARE OF THIS PATIENT: 38 minutes.    Gladstone Lighter M.D on 04/11/2018 at 1:34 PM  Between 7am to 6pm - Pager - [phone]  After 6pm go to [email or web address] - Proofreader  [facility]  Office  [phone]  CC: Primary care physician; Albina Billet, MD   Note: This dictation was prepared with Dragon dictation along with smaller phrase technology. Any transcriptional errors that result from this process are unintentional.

## 2018-04-11 NOTE — Progress Notes (Signed)
Pt being discharged home with home health, discharge instructions reviewed with pt and family, states understanding, pt with no complaint at discharge

## 2018-04-11 NOTE — Care Management (Signed)
Patient has orders for discharge. RNCM attempted to see patient but he was confused. Once family arrived patient had already been discharged. RNCM called number that is in the chart for emergency contact and left voicemail to see if family was interested in home health. Per PT note patient is from [facility]. Will set up services if family chooses once they call back. Ines Bloomer RN BSN RNCM [phone]

## 2018-04-11 NOTE — Evaluation (Signed)
Physical Therapy Evaluation Patient Details Name: Ian Jimenez MRN: [id number] DOB: 1934-02-17 Today's Date: 04/11/2018   History of Present Illness  Ian Jimenez is an 84yo male who comes to [facility] on 9/7 after experiencing slurred speech, right facial numbness, RLE/RUE paresthesia. Pt lives at the [facility] [place], [place] facility without AD, and working with PT PTA d/t soem leg weakness. PMH: DM2, dementia, HTN.   Clinical Impression  Pt admitted with above diagnosis. Pt currently with functional limitations due to the deficits listed below (see "PT Problem List"). Upon entry, pt in bed, pt's wife and daughter present. The pt is awake and agreeable to participate. The pt is alert and oriented to self, pleasant, conversational, and following simple commands consistently: the pt is able to best interact when Pryor Curia speaks loudly. Functional mobility assessment demonstrates increased time requirements, low tolerance, and mild episodic gait instability, however these deficits are consistent with the patient's baseline level function. Empirically, the patient demonstrates increased risk of recurrent falls AEB gait speed <0.74ms, forward reach <5", and multiple LOB demonstrated throughout session. Patient is near baseline, all education completed with family regarding appropriateness of RW use at this time, and time is given to address all questions/concerns. No additional skilled PT services needed at this time, PT signing off. PT recommends daily ambulation ad lib or with nursing staff as needed to prevent deconditioning.      Follow Up Recommendations Home health PT;Supervision for mobility/OOB(resume HHPT services as PTA)    Equipment Recommendations  Rolling walker with 5" wheels(Hitherto the patient has AMB without assistive device. )    Recommendations for Other Services       Precautions / Restrictions Precautions Precautions: Fall Restrictions Weight Bearing Restrictions: No       Mobility  Bed Mobility Overal bed mobility: Independent                Transfers Overall transfer level: Modified independent               General transfer comment: mild use of hands on thighs to rise, no LOB at top, velocity is appropriate for age  Ambulation/Gait Ambulation/Gait assistance: Supervision Gait Distance (Feet): 200 Feet Assistive device: None Gait Pattern/deviations: Decreased weight shift to right;Decreased stance time - right Gait velocity: 0.585m  Gait velocity interpretation: <1.8 ft/sec, indicate of risk for recurrent falls General Gait Details: slow gait, suspect some decline in confidence; reports to be at baseline. no frank asymmetry.   Stairs            Wheelchair Mobility    Modified Rankin (Stroke Patients Only)       Balance Overall balance assessment: Modified Independent;Mild deficits observed, not formally tested(One early LOB, self corrected; when first out of bed, and turned into an object he did not see. )                                           Pertinent Vitals/Pain Pain Assessment: No/denies pain    Home Living Family/patient expects to be discharged to:: Assisted living Living Arrangements: Spouse/significant other             Home Equipment: Walker - 2 wheels      Prior Function           Comments: [facility]      Hand  Dominance        Extremity/Trunk Assessment   Upper Extremity Assessment Upper Extremity Assessment: Overall WFL for tasks assessed    Lower Extremity Assessment Lower Extremity Assessment: Overall WFL for tasks assessed    Cervical / Trunk Assessment Cervical / Trunk Assessment: Normal  Communication   Communication: HOH  Cognition Arousal/Alertness: Awake/alert Behavior During Therapy: WFL for tasks assessed/performed Overall Cognitive Status: Within Functional Limits for  tasks assessed(mild/early dementia at baseline)                                        General Comments      Exercises     Assessment/Plan    PT Assessment All further PT needs can be met in the next venue of care;Patent does not need any further PT services  PT Problem List Decreased strength;Decreased balance;Decreased mobility       PT Treatment Interventions      PT Goals (Current goals can be found in the Care Plan section)  Acute Rehab PT Goals PT Goal Formulation: All assessment and education complete, DC therapy    Frequency     Barriers to discharge        Co-evaluation               AM-PAC PT "6 Clicks" Daily Activity  Outcome Measure Difficulty turning over in bed (including adjusting bedclothes, sheets and blankets)?: None Difficulty moving from lying on back to sitting on the side of the bed? : None Difficulty sitting down on and standing up from a chair with arms (e.g., wheelchair, bedside commode, etc,.)?: None Help needed moving to and from a bed to chair (including a wheelchair)?: A Little Help needed walking in hospital room?: A Little Help needed climbing 3-5 steps with a railing? : A Little 6 Click Score: 21    End of Session   Activity Tolerance: Patient tolerated treatment well Patient left: with call bell/phone within reach;with family/visitor present Nurse Communication: Mobility status PT Visit Diagnosis: Difficulty in walking, not elsewhere classified (R26.2);Other symptoms and signs involving the nervous system (R29.898)    Time: 9574-7340 PT Time Calculation (min) (ACUTE ONLY): 14 min   Charges:   PT Evaluation $PT Eval Moderate Complexity: 1 Mod         12:28 PM, 04/11/18 Etta Grandchild, PT, DPT Physical Therapist - [facility]  [phone] ([place])     Buccola,Allan C 04/11/2018, 12:21 PM

## 2018-04-14 ENCOUNTER — Encounter: Payer: Self-pay | Admitting: Podiatry

## 2018-04-14 ENCOUNTER — Ambulatory Visit (INDEPENDENT_AMBULATORY_CARE_PROVIDER_SITE_OTHER): Payer: Medicare Other | Admitting: Podiatry

## 2018-04-14 DIAGNOSIS — E119 Type 2 diabetes mellitus without complications: Secondary | ICD-10-CM

## 2018-04-14 NOTE — Progress Notes (Signed)
Patient was not seen today. He has dementia and refused treatment. Patient's daughter was with him today and said she would reschedule his appt to another time.

## 2018-04-29 ENCOUNTER — Emergency Department: Payer: Medicare Other

## 2018-04-29 ENCOUNTER — Emergency Department
Admission: EM | Admit: 2018-04-29 | Discharge: 2018-04-30 | Disposition: A | Discharge status: Home / Self Care | Payer: Medicare Other | Source: Home / Self Care | Attending: Emergency Medicine | Admitting: Emergency Medicine

## 2018-04-29 ENCOUNTER — Encounter: Payer: Self-pay | Admitting: *Deleted

## 2018-04-29 DIAGNOSIS — R479 Unspecified speech disturbances: Secondary | ICD-10-CM | POA: Insufficient documentation

## 2018-04-29 DIAGNOSIS — E119 Type 2 diabetes mellitus without complications: Secondary | ICD-10-CM | POA: Insufficient documentation

## 2018-04-29 DIAGNOSIS — F0390 Unspecified dementia without behavioral disturbance: Secondary | ICD-10-CM

## 2018-04-29 DIAGNOSIS — Z7984 Long term (current) use of oral hypoglycemic drugs: Secondary | ICD-10-CM | POA: Insufficient documentation

## 2018-04-29 DIAGNOSIS — I10 Essential (primary) hypertension: Secondary | ICD-10-CM

## 2018-04-29 DIAGNOSIS — Z79899 Other long term (current) drug therapy: Secondary | ICD-10-CM

## 2018-04-29 DIAGNOSIS — I160 Hypertensive urgency: Secondary | ICD-10-CM | POA: Diagnosis not present

## 2018-04-29 DIAGNOSIS — R4182 Altered mental status, unspecified: Secondary | ICD-10-CM | POA: Diagnosis not present

## 2018-04-29 LAB — COMPREHENSIVE METABOLIC PANEL
ALT: 15 U/L (ref 0–44)
ANION GAP: 11 (ref 5–15)
AST: 28 U/L (ref 15–41)
Albumin: 3.9 g/dL (ref 3.5–5.0)
Alkaline Phosphatase: 68 U/L (ref 38–126)
BILIRUBIN TOTAL: 0.7 mg/dL (ref 0.3–1.2)
BUN: 17 mg/dL (ref 8–23)
CO2: 27 mmol/L (ref 22–32)
CREATININE: 1.4 mg/dL — AB (ref 0.61–1.24)
Calcium: 9.1 mg/dL (ref 8.9–10.3)
Chloride: 102 mmol/L (ref 98–111)
GFR calc non Af Amer: 45 mL/min — ABNORMAL LOW (ref >60)
GFR, EST AFRICAN AMERICAN: 52 mL/min — AB (ref >60)
Glucose, Bld: 148 mg/dL — ABNORMAL HIGH (ref 70–99)
Potassium: 4.2 mmol/L (ref 3.5–5.1)
Sodium: 140 mmol/L (ref 135–145)
TOTAL PROTEIN: 7 g/dL (ref 6.5–8.1)

## 2018-04-29 LAB — CBC
HCT: 44.6 % (ref 40.0–52.0)
HEMOGLOBIN: 15.6 g/dL (ref 13.0–18.0)
MCH: 31 pg (ref 26.0–34.0)
MCHC: 34.9 g/dL (ref 32.0–36.0)
MCV: 88.7 fL (ref 80.0–100.0)
PLATELETS: 303 10*3/uL (ref 150–440)
RBC: 5.02 MIL/uL (ref 4.40–5.90)
RDW: 14.3 % (ref 11.5–14.5)
WBC: 9 10*3/uL (ref 3.8–10.6)

## 2018-04-29 LAB — URINALYSIS, COMPLETE (UACMP) WITH MICROSCOPIC
BACTERIA UA: NONE SEEN
Bilirubin Urine: NEGATIVE
Glucose, UA: 50 mg/dL — AB
Hgb urine dipstick: NEGATIVE
Ketones, ur: NEGATIVE mg/dL
LEUKOCYTES UA: NEGATIVE
Nitrite: NEGATIVE
PROTEIN: 100 mg/dL — AB
SPECIFIC GRAVITY, URINE: 1.01 (ref 1.005–1.030)
pH: 7 (ref 5.0–8.0)

## 2018-04-29 LAB — TROPONIN I

## 2018-04-29 LAB — LIPASE, BLOOD: LIPASE: 69 U/L — AB (ref 11–51)

## 2018-04-29 MED ORDER — SODIUM CHLORIDE 0.9 % IV BOLUS
500.0000 mL | Freq: Once | INTRAVENOUS | Status: AC
  Administered 2018-04-29: 500 mL via INTRAVENOUS

## 2018-04-29 NOTE — ED Provider Notes (Signed)
[facility] Emergency Department Provider Note ____________________________________________   First MD Initiated Contact with Patient 04/29/18 2103     (approximate)  I have reviewed the triage vital signs and the nursing notes.   HISTORY  Chief Complaint Emesis  Level 5 caveat: History of present illness limited due to dementia  HPI Ian Jimenez is a 84 y.o. male with PMH as noted below who presents with apparent altered mental status and slurred speech or difficulty speaking around 4:45 PM today.  It has now somewhat resolved.  He then began having vomiting upon ED arrival.  Per the family, the patient has had some urinary frequency and malodorous urine.  He is otherwise been in his usual state of health.  The patient had a TIA earlier this month with right-sided facial droop and slurred speech.  The symptoms resolved and his work-up was negative.    Past Medical History:  Diagnosis Date  . Dementia   . Diabetes mellitus without complication ([place])   . Hypertension     Patient Active Problem List   Diagnosis Date Noted  . TIA (transient ischemic attack) 04/10/2018    Past Surgical History:  Procedure Laterality Date  . none      Prior to Admission medications   Medication Sig Start Date End Date Taking? Authorizing Provider  amLODipine (NORVASC) 5 MG tablet Take 5 mg by mouth daily.    [provider]  clopidogrel (PLAVIX) 75 MG tablet Take 1 tablet (75 mg total) by mouth daily. 04/11/18 08/09/18  Gladstone Lighter, MD  donepezil (ARICEPT) 10 MG tablet Take 10 mg by mouth at bedtime.    [provider]  escitalopram (LEXAPRO) 5 MG tablet Take 5 mg by mouth daily. 03/12/18   [provider]  glipiZIDE (GLUCOTROL) 5 MG tablet Take 1 tablet (5 mg total) by mouth 2 (two) times daily before a meal. 04/11/18   Gladstone Lighter, MD  lisinopril (PRINIVIL,ZESTRIL) 20 MG tablet Take 20 mg by mouth daily. 03/30/18   [provider]  metFORMIN (GLUCOPHAGE) 1000 MG tablet Take 1,000 mg by mouth 2 (two) times daily with a meal.    [provider]  simvastatin (ZOCOR) 20 MG tablet Take 20 mg by mouth daily.    [provider]    Allergies Patient has no known allergies.  Family History  Problem Relation Age of Onset  . Heart disease Father     Social History Social History   Tobacco Use  . Smoking status: Never Smoker  . Smokeless tobacco: Never Used  Substance Use Topics  . Alcohol use: Never    Frequency: Never  . Drug use: Never    Review of Systems Level 5 caveat: Unable to obtain review of systems due to dementia    ____________________________________________   PHYSICAL EXAM:  VITAL SIGNS: ED Triage Vitals  Enc Vitals Group     BP 04/29/18 2111 (!) 181/110     Pulse Rate 04/29/18 2111 90     Resp 04/29/18 2111 16     Temp 04/29/18 2111 97.6 F (36.4 C)     Temp Source 04/29/18 2111 Oral     SpO2 04/29/18 2111 94 %     Weight 04/29/18 2114 157 lb (71.2 kg)     Height 04/29/18 2114 5\' 7"  (1.702 m)     Head Circumference --      Peak Flow --      Pain Score 04/29/18 2114 0  Pain Loc --      Pain Edu? --      Excl. in [place]? --     Constitutional: Alert, mildly confused.  Following commands appropriately.  At baseline mental status per family.  Comfortable appearing. Eyes: Conjunctivae are normal.  EOMI.  PERRLA.  No neglect. Head: Atraumatic. Nose: No congestion/rhinnorhea. Mouth/Throat: Mucous membranes are slightly dry.   Neck: Normal range of motion.  Cardiovascular: Normal rate, regular rhythm. Grossly normal heart sounds.  Good peripheral circulation. Respiratory: Normal respiratory effort.  No retractions. Lungs CTAB. Gastrointestinal: Soft and nontender. No distention.  Genitourinary: No flank tenderness. Musculoskeletal: Extremities warm and well perfused.  Neurologic: Clear speech (although family states that is slightly dysarthric compared to  baseline).  Motor intact in all extremities.  No drift.  No ataxia.  No facial droop. Skin:  Skin is warm and dry. No rash noted. Psychiatric: Calm and cooperative.  ____________________________________________   LABS (all labs ordered are listed, but only abnormal results are displayed)  Labs Reviewed  COMPREHENSIVE METABOLIC PANEL - Abnormal; Notable for the following components:      Result Value   Glucose, Bld 148 (*)    Creatinine, Ser 1.40 (*)    GFR calc non Af Amer 45 (*)    GFR calc Af Amer 52 (*)    All other components within normal limits  LIPASE, BLOOD - Abnormal; Notable for the following components:   Lipase 69 (*)    All other components within normal limits  URINALYSIS, COMPLETE (UACMP) WITH MICROSCOPIC - Abnormal; Notable for the following components:   Color, Urine STRAW (*)    APPearance CLEAR (*)    Glucose, UA 50 (*)    Protein, ur 100 (*)    All other components within normal limits  CBC  TROPONIN I  CBG MONITORING, ED   ____________________________________________  EKG  ED ECG REPORT I, Arta Silence, the attending physician, personally viewed and interpreted this ECG.  Date: 04/29/2018 EKG Time: 2105 Rate: 100 Rhythm: normal sinus rhythm QRS Axis: normal Intervals: Prolonged QT ST/T Wave abnormalities: Nonspecific repolarization abnormality laterally Narrative Interpretation: no evidence of acute ischemia; no significant change when compared to EKG of 04/10/2018  ____________________________________________  RADIOLOGY  CT head: No ICH or acute stroke CXR: No focal infiltrate  ____________________________________________   PROCEDURES  Procedure(s) performed: No  Procedures  Critical Care performed: No ____________________________________________   INITIAL IMPRESSION / ASSESSMENT AND PLAN / ED COURSE  Pertinent labs & imaging results that were available during my care of the patient were reviewed by me and considered in my  medical decision making (see chart for details).  84 year old male with PMH as noted above presents with possible altered mental status and speech disturbance which appear to have mostly resolved.  The symptoms started several hours prior to his ED arrival and waxed and waned somewhat per family members.  He also had some vomiting on ED arrival which is resolved.  I reviewed the past medical records in Epic; the patient was most recently admitted earlier this month and discharged on 04/11/2018 after a TIA with negative imaging.  At that time he had unilateral weakness and facial droop.  On exam, the patient is alert and comfortable appearing.  He is at his baseline mental status per family.  His vital signs are normal except for hypertension.  He is following commands appropriately.  Neuro exam is nonfocal, although the family states his speech, which sounds clear to me, is still not quite  at his baseline.  Differential includes recurrent TIA versus possible global cause of weakness/AMS including dehydration, other metabolic etiology, or infection.  We will obtain lab work-up, CT head, chest x-ray and UA, and reassess.  ----------------------------------------- 11:58 PM on 04/29/2018 -----------------------------------------  The patient's lab work-up has been unremarkable.  CT head is negative.  Chest x-ray shows no focal infiltrate.  His speech continues to be clear, and his neuro exam otherwise continues to be normal.  He is able to walk with his baseline shuffling gait.  At this time I do not suspect TIA or other acute neurologic cause.  Presentation is more consistent with global cause such as possible near syncope.  I counseled the family members on the results of the work-up.  Given the lack of focal neurologic symptoms and the normal neuro exam in the ED, at this time, there is no specific indication for admission or further work-up or observation especially since the patient just had a TIA  work-up earlier this month.  He is stable for discharge at this time.  I discussed the options for disposition with the family, and they feel comfortable taking him home.  Return precautions given, and they expressed understanding. ____________________________________________   FINAL CLINICAL IMPRESSION(S) / ED DIAGNOSES  Final diagnoses:  Speech disturbance, unspecified type      NEW MEDICATIONS STARTED DURING THIS VISIT:  New Prescriptions   No medications on file     Note:  This document was prepared using Dragon voice recognition software and may include unintentional dictation errors.    Arta Silence, MD 04/30/18 0000

## 2018-04-29 NOTE — ED Notes (Signed)
Helped pt ambulate in the room after getting him dressed.  Family at the bedside states that his "shuffling gate" is his norm and he always walks that way

## 2018-04-29 NOTE — Discharge Instructions (Addendum)
Follow-up with the primary care doctor within the next 1 to 2 weeks.  Return to the ER for new, worsening, persistent speech change or inability to speak, weakness, facial droop, change in mental status, confusion, or any other new or worsening symptoms that concern you.

## 2018-04-29 NOTE — ED Triage Notes (Signed)
Pt lives at [place] with wife.  Wife called EMS because pt was "not acting right". On EMS arrival pt was alert and oriented and speech was WNL per ems.  On arrival to ED pt is coughing and has multiple episodes of vomiting (emesis appears like undigested food.  Pt has a depend on that is saturated with urine (very concentrated smell) and falling apart.  Pt cleaned up (some dried stool also cleaned up) and placed on condom cath to collect urine and placed pt in new depend.  Pt is able to answer when asked his name.  Pt is not able to give me the year. No facial droop or focal weakness noted

## 2018-04-30 ENCOUNTER — Inpatient Hospital Stay
Admission: EM | Admit: 2018-04-30 | Discharge: 2018-05-03 | DRG: 305 | Disposition: A | Discharge status: Skilled Nursing Facility | Payer: Medicare Other | Attending: Family Medicine | Admitting: Family Medicine

## 2018-04-30 ENCOUNTER — Other Ambulatory Visit: Payer: Self-pay

## 2018-04-30 ENCOUNTER — Emergency Department: Payer: Medicare Other

## 2018-04-30 DIAGNOSIS — E875 Hyperkalemia: Secondary | ICD-10-CM | POA: Diagnosis present

## 2018-04-30 DIAGNOSIS — W1830XA Fall on same level, unspecified, initial encounter: Secondary | ICD-10-CM | POA: Diagnosis present

## 2018-04-30 DIAGNOSIS — Y92009 Unspecified place in unspecified non-institutional (private) residence as the place of occurrence of the external cause: Secondary | ICD-10-CM | POA: Diagnosis not present

## 2018-04-30 DIAGNOSIS — Z8249 Family history of ischemic heart disease and other diseases of the circulatory system: Secondary | ICD-10-CM

## 2018-04-30 DIAGNOSIS — Z7902 Long term (current) use of antithrombotics/antiplatelets: Secondary | ICD-10-CM

## 2018-04-30 DIAGNOSIS — F0390 Unspecified dementia without behavioral disturbance: Secondary | ICD-10-CM | POA: Diagnosis present

## 2018-04-30 DIAGNOSIS — Z7984 Long term (current) use of oral hypoglycemic drugs: Secondary | ICD-10-CM | POA: Diagnosis not present

## 2018-04-30 DIAGNOSIS — N183 Chronic kidney disease, stage 3 (moderate): Secondary | ICD-10-CM | POA: Diagnosis present

## 2018-04-30 DIAGNOSIS — Z66 Do not resuscitate: Secondary | ICD-10-CM | POA: Diagnosis present

## 2018-04-30 DIAGNOSIS — I129 Hypertensive chronic kidney disease with stage 1 through stage 4 chronic kidney disease, or unspecified chronic kidney disease: Secondary | ICD-10-CM | POA: Diagnosis present

## 2018-04-30 DIAGNOSIS — Z79899 Other long term (current) drug therapy: Secondary | ICD-10-CM

## 2018-04-30 DIAGNOSIS — E1122 Type 2 diabetes mellitus with diabetic chronic kidney disease: Secondary | ICD-10-CM | POA: Diagnosis present

## 2018-04-30 DIAGNOSIS — I160 Hypertensive urgency: Secondary | ICD-10-CM | POA: Diagnosis present

## 2018-04-30 DIAGNOSIS — R4182 Altered mental status, unspecified: Secondary | ICD-10-CM | POA: Diagnosis present

## 2018-04-30 DIAGNOSIS — Z8673 Personal history of transient ischemic attack (TIA), and cerebral infarction without residual deficits: Secondary | ICD-10-CM

## 2018-04-30 LAB — CBC WITH DIFFERENTIAL/PLATELET
Basophils Absolute: 0.1 10*3/uL (ref 0–0.1)
Basophils Relative: 1 %
EOS ABS: 0.2 10*3/uL (ref 0–0.7)
EOS PCT: 3 %
HCT: 41.3 % (ref 40.0–52.0)
HEMOGLOBIN: 14.4 g/dL (ref 13.0–18.0)
LYMPHS ABS: 2.3 10*3/uL (ref 1.0–3.6)
Lymphocytes Relative: 33 %
MCH: 30.9 pg (ref 26.0–34.0)
MCHC: 34.7 g/dL (ref 32.0–36.0)
MCV: 88.8 fL (ref 80.0–100.0)
MONO ABS: 0.8 10*3/uL (ref 0.2–1.0)
MONOS PCT: 12 %
NEUTROS PCT: 51 %
Neutro Abs: 3.6 10*3/uL (ref 1.4–6.5)
Platelets: 285 10*3/uL (ref 150–440)
RBC: 4.65 MIL/uL (ref 4.40–5.90)
RDW: 14.3 % (ref 11.5–14.5)
WBC: 7.1 10*3/uL (ref 3.8–10.6)

## 2018-04-30 LAB — COMPREHENSIVE METABOLIC PANEL
ALK PHOS: 68 U/L (ref 38–126)
ALT: 12 U/L (ref 0–44)
ANION GAP: 9 (ref 5–15)
AST: 22 U/L (ref 15–41)
Albumin: 3.6 g/dL (ref 3.5–5.0)
BUN: 17 mg/dL (ref 8–23)
CALCIUM: 8.8 mg/dL — AB (ref 8.9–10.3)
CO2: 28 mmol/L (ref 22–32)
Chloride: 103 mmol/L (ref 98–111)
Creatinine, Ser: 1.63 mg/dL — ABNORMAL HIGH (ref 0.61–1.24)
GFR calc non Af Amer: 38 mL/min — ABNORMAL LOW (ref >60)
GFR, EST AFRICAN AMERICAN: 44 mL/min — AB (ref >60)
Glucose, Bld: 95 mg/dL (ref 70–99)
Potassium: 4.3 mmol/L (ref 3.5–5.1)
SODIUM: 140 mmol/L (ref 135–145)
TOTAL PROTEIN: 6.5 g/dL (ref 6.5–8.1)
Total Bilirubin: 0.4 mg/dL (ref 0.3–1.2)

## 2018-04-30 LAB — TROPONIN I: Troponin I: <0.03 ng/mL (ref <0.03)

## 2018-04-30 LAB — GLUCOSE, CAPILLARY: Glucose-Capillary: 152 mg/dL — ABNORMAL HIGH (ref 70–99)

## 2018-04-30 MED ORDER — ACETAMINOPHEN 325 MG PO TABS: 650.0000 mg | ORAL_TABLET | Freq: Four times a day (QID) | ORAL | Status: DC | PRN

## 2018-04-30 MED ORDER — BISACODYL 5 MG PO TBEC: 5.0000 mg | DELAYED_RELEASE_TABLET | Freq: Every day | ORAL | Status: DC | PRN

## 2018-04-30 MED ORDER — AMLODIPINE BESYLATE 5 MG PO TABS
10.0000 mg | ORAL_TABLET | Freq: Once | ORAL | Status: AC
  Administered 2018-04-30: 10 mg via ORAL
  Filled 2018-04-30: qty 2

## 2018-04-30 MED ORDER — SODIUM CHLORIDE 0.9 % IV SOLN
Freq: Once | INTRAVENOUS | Status: AC
  Administered 2018-05-01: 01:00:00 via INTRAVENOUS

## 2018-04-30 MED ORDER — HYDROCODONE-ACETAMINOPHEN 5-325 MG PO TABS
1.0000 | ORAL_TABLET | ORAL | Status: DC | PRN
  Administered 2018-05-02: 1 via ORAL
  Filled 2018-04-30: qty 1

## 2018-04-30 MED ORDER — DOCUSATE SODIUM 100 MG PO CAPS
100.0000 mg | ORAL_CAPSULE | Freq: Two times a day (BID) | ORAL | Status: DC
  Administered 2018-05-01 – 2018-05-03 (×6): 100 mg via ORAL
  Filled 2018-04-30 (×6): qty 1

## 2018-04-30 MED ORDER — INSULIN ASPART 100 UNIT/ML ~~LOC~~ SOLN: 0.0000 [IU] | Freq: Every day | SUBCUTANEOUS | Status: DC

## 2018-04-30 MED ORDER — AMLODIPINE BESYLATE 5 MG PO TABS
5.0000 mg | ORAL_TABLET | Freq: Every day | ORAL | Status: DC
  Administered 2018-05-01: 5 mg via ORAL
  Filled 2018-04-30: qty 1

## 2018-04-30 MED ORDER — ONDANSETRON HCL 4 MG PO TABS: 4.0000 mg | ORAL_TABLET | Freq: Four times a day (QID) | ORAL | Status: DC | PRN

## 2018-04-30 MED ORDER — ACETAMINOPHEN 650 MG RE SUPP: 650.0000 mg | Freq: Four times a day (QID) | RECTAL | Status: DC | PRN

## 2018-04-30 MED ORDER — INSULIN ASPART 100 UNIT/ML ~~LOC~~ SOLN
0.0000 [IU] | Freq: Three times a day (TID) | SUBCUTANEOUS | Status: DC
  Administered 2018-05-01 – 2018-05-02 (×3): 3 [IU] via SUBCUTANEOUS
  Administered 2018-05-02: 8 [IU] via SUBCUTANEOUS
  Administered 2018-05-02 – 2018-05-03 (×2): 3 [IU] via SUBCUTANEOUS
  Administered 2018-05-03: 5 [IU] via SUBCUTANEOUS
  Filled 2018-04-30 (×7): qty 1

## 2018-04-30 MED ORDER — LABETALOL HCL 5 MG/ML IV SOLN
5.0000 mg | Freq: Once | INTRAVENOUS | Status: AC
  Administered 2018-04-30: 5 mg via INTRAVENOUS
  Filled 2018-04-30: qty 4

## 2018-04-30 MED ORDER — ONDANSETRON HCL 4 MG/2ML IJ SOLN: 4.0000 mg | Freq: Four times a day (QID) | INTRAMUSCULAR | Status: DC | PRN

## 2018-04-30 MED ORDER — CLOPIDOGREL BISULFATE 75 MG PO TABS
75.0000 mg | ORAL_TABLET | Freq: Every day | ORAL | Status: DC
  Administered 2018-05-01 – 2018-05-03 (×3): 75 mg via ORAL
  Filled 2018-04-30 (×3): qty 1

## 2018-04-30 MED ORDER — HEPARIN SODIUM (PORCINE) 5000 UNIT/ML IJ SOLN
5000.0000 [IU] | Freq: Three times a day (TID) | INTRAMUSCULAR | Status: DC
  Administered 2018-05-01 – 2018-05-03 (×7): 5000 [IU] via SUBCUTANEOUS
  Filled 2018-04-30 (×8): qty 1

## 2018-04-30 MED ORDER — ACETAMINOPHEN 500 MG PO TABS
1000.0000 mg | ORAL_TABLET | Freq: Once | ORAL | Status: AC
  Administered 2018-04-30: 1000 mg via ORAL
  Filled 2018-04-30: qty 2

## 2018-04-30 MED ORDER — TRAZODONE HCL 50 MG PO TABS
25.0000 mg | ORAL_TABLET | Freq: Every evening | ORAL | Status: DC | PRN
  Administered 2018-05-02: 25 mg via ORAL
  Filled 2018-04-30: qty 1

## 2018-04-30 NOTE — ED Triage Notes (Addendum)
Pt comes via [facility] from [facility] facility with possible c/o of TIA per wife. EMS reports pt recently started Plavix and had fall this am with no LOC. Pt has hx of dementia. Pt is alert. EMS reports BP-179/91, BS-81.

## 2018-04-30 NOTE — ED Notes (Signed)
Family inquiring about bed assignment for patient. Family updated on still waiting for bed to be assigned.

## 2018-04-30 NOTE — ED Notes (Signed)
Pt's family informs me that pt is incontinent and they think he peed in his underpants. Pt checked and is wet. Pt cleaned up and dry and new sheets applied. Pt resting comfortably now

## 2018-04-30 NOTE — ED Notes (Signed)
Patient transported to CT 

## 2018-04-30 NOTE — ED Notes (Signed)
Attempted to call report, per RN Jonelle Sidle pt hasn't even been assigned to a nurse and the room number is going to change. RN April to call back and give report at a later time. Charge RN informed.

## 2018-04-30 NOTE — ED Notes (Signed)
Pt given meal tray and drink at this time

## 2018-04-30 NOTE — ED Notes (Signed)
Family asking when patient is going to get a bed assignment. Secretary Vaughan Basta informed family that patient has another patient ahead of them for bed assignment.

## 2018-04-30 NOTE — ED Provider Notes (Signed)
[facility] Emergency Department Provider Note    First MD Initiated Contact with Patient 04/30/18 1705     (approximate)  I have reviewed the triage vital signs and the nursing notes.   HISTORY  Chief Complaint Cerebrovascular Accident and Fall  Level V Caveat:  dementia  HPI Ian Jimenez is a 84 y.o. male the below listed past medical history seen last night for near syncopal episode presents to the ER after fall this morning.  Was unwitnessed.  Reportedly did hit his head and has been complaining of headache.  He is on Plavix.  There is initially no report of loss of consciousness with the patient does have dementia and is actually unable to provide any additional details to the event.  Denies any chest pain, nausea vomiting, abdominal pain, numbness or tingling, photophobia, fevers.   Wife states that the reason he fell was that he might of been feeling weak because he had not eaten anything because he got discharged back to home late last night.   Past Medical History:  Diagnosis Date  . Dementia   . Diabetes mellitus without complication ([place])   . Hypertension    Family History  Problem Relation Age of Onset  . Heart disease Father    Past Surgical History:  Procedure Laterality Date  . none     Patient Active Problem List   Diagnosis Date Noted  . TIA (transient ischemic attack) 04/10/2018      Prior to Admission medications   Medication Sig Start Date End Date Taking? Authorizing Provider  amLODipine (NORVASC) 5 MG tablet Take 5 mg by mouth daily.    [provider]  clopidogrel (PLAVIX) 75 MG tablet Take 1 tablet (75 mg total) by mouth daily. 04/11/18 08/09/18  Gladstone Lighter, MD  donepezil (ARICEPT) 10 MG tablet Take 10 mg by mouth at bedtime.    [provider]  escitalopram (LEXAPRO) 5 MG tablet Take 5 mg by mouth daily. 03/12/18   [provider]  glipiZIDE (GLUCOTROL) 5 MG tablet Take 1 tablet (5 mg total)  by mouth 2 (two) times daily before a meal. 04/11/18   Gladstone Lighter, MD  lisinopril (PRINIVIL,ZESTRIL) 20 MG tablet Take 20 mg by mouth daily. 03/30/18   [provider]  metFORMIN (GLUCOPHAGE) 1000 MG tablet Take 1,000 mg by mouth 2 (two) times daily with a meal.    [provider]  simvastatin (ZOCOR) 20 MG tablet Take 20 mg by mouth daily.    [provider]    Allergies Patient has no known allergies.    Social History Social History   Tobacco Use  . Smoking status: Never Smoker  . Smokeless tobacco: Never Used  Substance Use Topics  . Alcohol use: Never    Frequency: Never  . Drug use: Never    Review of Systems Patient denies headaches, rhinorrhea, blurry vision, numbness, shortness of breath, chest pain, edema, cough, abdominal pain, nausea, vomiting, diarrhea, dysuria, fevers, rashes or hallucinations unless otherwise stated above in HPI. ____________________________________________   PHYSICAL EXAM:  VITAL SIGNS: Vitals:   04/30/18 1930 04/30/18 2000  BP: (!) 201/88 (!) 162/93  Pulse: 84 77  Resp: 18 14  Temp:    SpO2: 97% 96%    Constitutional: Alert , resting comfortably in bed Eyes: Conjunctivae are normal.  Head: Atraumatic. Nose: No congestion/rhinnorhea. Mouth/Throat: Mucous membranes are moist.   Neck: No stridor. Painless ROM.  Cardiovascular: Normal rate, regular rhythm. Grossly normal heart sounds.  Good peripheral circulation. Respiratory: Normal respiratory effort.  No retractions. Lungs CTAB. Gastrointestinal: Soft and nontender. No distention. No abdominal bruits. No CVA tenderness. Genitourinary:  Musculoskeletal: No lower extremity tenderness nor edema.  No joint effusions. Neurologic:  Normal speech and language. No gross focal neurologic deficits are appreciated. No facial droop Skin:  Skin is warm, dry and intact. No rash noted. Psychiatric: Mood and affect are normal. Speech and behavior are  normal.  ____________________________________________   LABS (all labs ordered are listed, but only abnormal results are displayed)  Results for orders placed or performed during the hospital encounter of 04/30/18 (from the past 24 hour(s))  CBC with Differential/Platelet     Status: None   Collection Time: 04/30/18  5:30 PM  Result Value Ref Range   WBC 7.1 3.8 - 10.6 K/uL   RBC 4.65 4.40 - 5.90 MIL/uL   Hemoglobin 14.4 13.0 - 18.0 g/dL   HCT 41.3 40.0 - 52.0 %   MCV 88.8 80.0 - 100.0 fL   MCH 30.9 26.0 - 34.0 pg   MCHC 34.7 32.0 - 36.0 g/dL   RDW 14.3 11.5 - 14.5 %   Platelets 285 150 - 440 K/uL   Neutrophils Relative % 51 %   Neutro Abs 3.6 1.4 - 6.5 K/uL   Lymphocytes Relative 33 %   Lymphs Abs 2.3 1.0 - 3.6 K/uL   Monocytes Relative 12 %   Monocytes Absolute 0.8 0.2 - 1.0 K/uL   Eosinophils Relative 3 %   Eosinophils Absolute 0.2 0 - 0.7 K/uL   Basophils Relative 1 %   Basophils Absolute 0.1 0 - 0.1 K/uL  Comprehensive metabolic panel     Status: Abnormal   Collection Time: 04/30/18  5:30 PM  Result Value Ref Range   Sodium 140 135 - 145 mmol/L   Potassium 4.3 3.5 - 5.1 mmol/L   Chloride 103 98 - 111 mmol/L   CO2 28 22 - 32 mmol/L   Glucose, Bld 95 70 - 99 mg/dL   BUN 17 8 - 23 mg/dL   Creatinine, Ser 1.63 (H) 0.61 - 1.24 mg/dL   Calcium 8.8 (L) 8.9 - 10.3 mg/dL   Total Protein 6.5 6.5 - 8.1 g/dL   Albumin 3.6 3.5 - 5.0 g/dL   AST 22 15 - 41 U/L   ALT 12 0 - 44 U/L   Alkaline Phosphatase 68 38 - 126 U/L   Total Bilirubin 0.4 0.3 - 1.2 mg/dL   GFR calc non Af Amer 38 (L) >60 mL/min   GFR calc Af Amer 44 (L) >60 mL/min   Anion gap 9 5 - 15  Troponin I     Status: None   Collection Time: 04/30/18  5:30 PM  Result Value Ref Range   Troponin I <0.03 <0.03 ng/mL   ____________________________________________  EKG My review and personal interpretation at Time: 17:02   Indication: fall  Rate: 80  Rhythm: sinus Axis: normal Other: normal intervals, no stemi,  abnormal EKG ____________________________________________  RADIOLOGY  I personally reviewed all radiographic images ordered to evaluate for the above acute complaints and reviewed radiology reports and findings.  These findings were personally discussed with the patient.  Please see medical record for radiology report.  ____________________________________________   PROCEDURES  Procedure(s) performed:  Procedures    Critical Care performed: no ____________________________________________   INITIAL IMPRESSION / ASSESSMENT AND PLAN / ED COURSE  Pertinent labs & imaging results that were available during my care of the patient were  reviewed by me and considered in my medical decision making (see chart for details).   DDX:sdh, sah, iph, Dehydration, sepsis, pna, uti, hypoglycemia, cva, drug effect, withdrawal, encephalitis  Ian Jimenez is a 84 y.o. who presents to the ED with symptoms as described above.  Patient does have underlying dementia.  Does arrive afebrile but is significantly hypertensive.  Did have report of fall.  This does not seem clinically consistent with stroke.  CT imaging will be emergently obtained to evaluate for traumatic injury.  The patient will be placed on continuous pulse oximetry and telemetry for monitoring.  Laboratory evaluation will be sent to evaluate for the above complaints.     Clinical Course as of May 01 2027  Fri Apr 30, 2018  1955 Patient reassessed.  Still significantly hypertensive.  This is despite receiving his home antihypertensive medications.  Will re-dose with IV labetalol and reassess.  Patient may be having episodes of hypertensive urgency.   [PR]    Clinical Course User Index [PR] Merlyn Lot, MD     As part of my medical decision making, I reviewed the following data within the [place] notes reviewed and incorporated, Labs reviewed, notes from prior ED visits and [place] Controlled Substance  Database   ____________________________________________   FINAL CLINICAL IMPRESSION(S) / ED DIAGNOSES  Final diagnoses:  Hypertensive urgency      NEW MEDICATIONS STARTED DURING THIS VISIT:  New Prescriptions   No medications on file     Note:  This document was prepared using Dragon voice recognition software and may include unintentional dictation errors.    Merlyn Lot, MD 04/30/18 2028

## 2018-04-30 NOTE — H&P (Signed)
[facility] [place] NAME: Ian Jimenez    MR#:  [id number]  DATE OF BIRTH:  November 16, 1933  DATE OF ADMISSION:  04/30/2018  PRIMARY CARE PHYSICIAN: Albina Billet, MD   REQUESTING/REFERRING PHYSICIAN:   CHIEF COMPLAINT:   Chief Complaint  Patient presents with  . Cerebrovascular Accident  . Fall    HISTORY OF PRESENT ILLNESS: Ian Jimenez  is a 84 y.o. male with a known history of dementia, diabetes type 2 and hypertension. Patient was brought to emergency room for evaluation status post mechanical fall at home.  Per patient's wife, he did hit his head when falling.  No reports of loss of consciousness, no fever, no chills, no chest pain, no vomiting, no diarrhea, no bleeding. Patient is unable to provide history due to his dementia.  Most of the information was taken from reviewing the medical chart and from discussion with emergency room physician. Blood test done emergency room, including CBC and CMP are grossly unremarkable, except for creatinine level of 1.63, which seems to be the baseline for this patient. At the arrival to emergency room patient was noted with elevated blood pressure at 201/88. No acute intracranial pathology per brain CT. Chest x-rays negative for acute cardiopulmonary process. Patient is admitted for further evaluation and treatment.   PAST MEDICAL HISTORY:   Past Medical History:  Diagnosis Date  . Dementia   . Diabetes mellitus without complication ([place])   . Hypertension     PAST SURGICAL HISTORY:  Past Surgical History:  Procedure Laterality Date  . none      SOCIAL HISTORY:  Social History   Tobacco Use  . Smoking status: Never Smoker  . Smokeless tobacco: Never Used  Substance Use Topics  . Alcohol use: Never    Frequency: Never    FAMILY HISTORY:  Family History  Problem Relation Age of Onset  . Heart disease Father     DRUG ALLERGIES: No Known Allergies  REVIEW OF SYSTEMS:    Unable to obtain, due to patient's dementia.  MEDICATIONS AT HOME:  Prior to Admission medications   Medication Sig Start Date End Date Taking? Authorizing Provider  amLODipine (NORVASC) 5 MG tablet Take 5 mg by mouth daily.   Yes [provider]  clopidogrel (PLAVIX) 75 MG tablet Take 1 tablet (75 mg total) by mouth daily. 04/11/18 08/09/18 Yes Gladstone Lighter, MD  donepezil (ARICEPT) 10 MG tablet Take 10 mg by mouth at bedtime.   Yes [provider]  escitalopram (LEXAPRO) 5 MG tablet Take 5 mg by mouth daily. 03/12/18  Yes [provider]  glipiZIDE (GLUCOTROL) 5 MG tablet Take 1 tablet (5 mg total) by mouth 2 (two) times daily before a meal. 04/11/18  Yes Gladstone Lighter, MD  lisinopril-hydrochlorothiazide (PRINZIDE,ZESTORETIC) 20-12.5 MG tablet Take 1 tablet by mouth daily.   Yes [provider]  metFORMIN (GLUCOPHAGE) 1000 MG tablet Take 1,000 mg by mouth 2 (two) times daily with a meal.   Yes [provider]  simvastatin (ZOCOR) 20 MG tablet Take 20 mg by mouth daily.   Yes [provider]      PHYSICAL EXAMINATION:   VITAL SIGNS: Blood pressure 140/72, pulse 73, temperature 98.5 F (36.9 C), temperature source Oral, resp. rate 18, height 5\' 7"  (1.702 m), weight 71.2 kg, SpO2 95 %.  GENERAL:  84 y.o.-year-old patient lying in the bed with no acute distress.  EYES: Pupils equal, round, reactive to light and  accommodation. No scleral icterus. Extraocular muscles intact.  HEENT: Head atraumatic, normocephalic. Oropharynx and nasopharynx clear.  NECK:  Supple, no jugular venous distention. No thyroid enlargement, no tenderness.  LUNGS: Reduced breath sounds bilaterally, no wheezing, rales,rhonchi or crepitation. No use of accessory muscles of respiration.  CARDIOVASCULAR: S1, S2 normal. No S3/S4.  ABDOMEN: Soft, nontender, nondistended. Bowel sounds present. No organomegaly or mass.  EXTREMITIES: No pedal edema, cyanosis, or  clubbing.  NEUROLOGIC: No focal weakness. PSYCHIATRIC: The patient is alert and oriented x 3.  SKIN: No obvious rash, lesion, or ulcer.   LABORATORY PANEL:   CBC Recent Labs  Lab 04/29/18 2113 04/30/18 1730  WBC 9.0 7.1  HGB 15.6 14.4  HCT 44.6 41.3  PLT 303 285  MCV 88.7 88.8  MCH 31.0 30.9  MCHC 34.9 34.7  RDW 14.3 14.3  LYMPHSABS  --  2.3  MONOABS  --  0.8  EOSABS  --  0.2  BASOSABS  --  0.1   ------------------------------------------------------------------------------------------------------------------  Chemistries  Recent Labs  Lab 04/29/18 2113 04/30/18 1730  NA 140 140  K 4.2 4.3  CL 102 103  CO2 27 28  GLUCOSE 148* 95  BUN 17 17  CREATININE 1.40* 1.63*  CALCIUM 9.1 8.8*  AST 28 22  ALT 15 12  ALKPHOS 68 68  BILITOT 0.7 0.4   ------------------------------------------------------------------------------------------------------------------ estimated creatinine clearance is 32.7 mL/min (A) (by C-G formula based on SCr of 1.63 mg/dL (H)). ------------------------------------------------------------------------------------------------------------------ No results for input(s): TSH, T4TOTAL, T3FREE, THYROIDAB in the last 72 hours.  Invalid input(s): FREET3   Coagulation profile No results for input(s): INR, PROTIME in the last 168 hours. ------------------------------------------------------------------------------------------------------------------- No results for input(s): DDIMER in the last 72 hours. -------------------------------------------------------------------------------------------------------------------  Cardiac Enzymes Recent Labs  Lab 04/29/18 2113 04/30/18 1730  TROPONINI <0.03 <0.03   ------------------------------------------------------------------------------------------------------------------ Invalid input(s):  POCBNP  ---------------------------------------------------------------------------------------------------------------  Urinalysis    Component Value Date/Time   COLORURINE STRAW (A) 04/29/2018 2129   APPEARANCEUR CLEAR (A) 04/29/2018 2129   LABSPEC 1.010 04/29/2018 2129   PHURINE 7.0 04/29/2018 2129   GLUCOSEU 50 (A) 04/29/2018 2129   HGBUR NEGATIVE 04/29/2018 2129   [place] NEGATIVE 04/29/2018 2129   [place] NEGATIVE 04/29/2018 2129   PROTEINUR 100 (A) 04/29/2018 2129   NITRITE NEGATIVE 04/29/2018 2129   LEUKOCYTESUR NEGATIVE 04/29/2018 2129     RADIOLOGY: Ct Head Wo Contrast  Result Date: 04/30/2018 CLINICAL DATA:  Possible TIA. Recently started Plavix, fell earlier today. Dementia. EXAM: CT HEAD WITHOUT CONTRAST TECHNIQUE: Contiguous axial images were obtained from the base of the skull through the vertex without intravenous contrast. COMPARISON:  04/29/2018. FINDINGS: Brain: No evidence for acute infarction, hemorrhage, mass lesion, hydrocephalus, or extra-axial fluid. Generalized atrophy. Hypoattenuation of white matter, likely small vessel disease. Vascular: Calcification of the cavernous internal carotid arteries consistent with cerebrovascular atherosclerotic disease. No signs of intracranial large vessel occlusion. Skull: Calvarium intact. Sinuses/Orbits: Chronic sinus disease.  Negative orbits. Other: BILATERAL TMJ arthritis. No mastoid fluid. No significant scalp hematoma. IMPRESSION: Atrophy and small vessel disease similar to priors. No acute intracranial findings. No change from yesterday's exam. Calcific cerebrovascular atherosclerosis. Electronically Signed   By: Staci Righter M.D.   On: 04/30/2018 18:09   Ct Head Wo Contrast  Result Date: 04/29/2018 CLINICAL DATA:  84 year old male with dementia. EXAM: CT HEAD WITHOUT CONTRAST TECHNIQUE: Contiguous axial images were obtained from the base of the skull through the vertex without intravenous contrast. COMPARISON:   Brain MRI dated 04/10/2018 FINDINGS: Brain: Mild age-related atrophy  and moderate chronic microvascular ischemic changes. There is no acute intracranial hemorrhage. No mass effect or midline shift. No extra-axial fluid collection. Vascular: No hyperdense vessel or unexpected calcification. Skull: Normal. Negative for fracture or focal lesion. Sinuses/Orbits: Diffuse mucoperiosteal thickening of paranasal sinuses. The mastoid air cells are clear. Other: None IMPRESSION: 1. No acute intracranial pathology. 2. Age-related atrophy and chronic microvascular ischemic changes. Electronically Signed   By: Anner Crete M.D.   On: 04/29/2018 22:17   Dg Chest Portable 1 View  Result Date: 04/29/2018 CLINICAL DATA:  84 year old male with history of diabetes and hypertension presenting with cough and vomiting. EXAM: PORTABLE CHEST 1 VIEW COMPARISON:  None. FINDINGS: No focal consolidation, pleural effusion, or pneumothorax. Mild cardiomegaly. No acute osseous pathology. IMPRESSION: No acute cardiopulmonary process. Cardiomegaly. Electronically Signed   By: Anner Crete M.D.   On: 04/29/2018 22:11    EKG: Orders placed or performed during the hospital encounter of 04/30/18  . EKG 12-Lead  . EKG 12-Lead  . ED EKG  . ED EKG    IMPRESSION AND PLAN:   1.  Hypertensive urgency.  Initial blood pressure was 201/88, improved now with hydralazine IV.  Will restart home BP medications.  2.  CKD 3, stable creatinine is baseline 1.6.  We will continue to monitor kidney function closely and avoid nephrotoxic medications.   3.  Diabetes type 2, stable.  Will monitor blood sugars before meals and at bedtime and use insulin treatment during the hospital stay.  4.  Dementia, continue Aricept.  All the records are reviewed and case discussed with ED provider. Management plans discussed with the patient, who is in agreement.  CODE STATUS: DNR Code Status History    Date Active Date Inactive Code Status Order ID  Comments User Context   04/10/2018 1830 04/11/2018 1600 DNR [id number]  Saundra Shelling, MD Inpatient   04/10/2018 1620 04/10/2018 1830 Full Code [id number]  Saundra Shelling, MD ED    Questions for Most Recent Historical Code Status (Order [id number])    Question Answer Comment   In the event of cardiac or respiratory ARREST Do not call a "code blue"    In the event of cardiac or respiratory ARREST Do not perform Intubation, CPR, defibrillation or ACLS    In the event of cardiac or respiratory ARREST Use medication by any route, position, wound care, and other measures to relive pain and suffering. May use oxygen, suction and manual treatment of airway obstruction as needed for comfort.         Advance Directive Documentation     Most Recent Value  Type of Advance Directive  Healthcare Power of Attorney, Living will  Pre-existing out of facility DNR order (yellow form or pink MOST form)  -  "MOST" Form in Place?  -       TOTAL TIME TAKING CARE OF THIS PATIENT: 50 minutes.    Amelia Jo M.D on 04/30/2018 at 10:39 PM  Between 7am to 6pm - Pager - [phone]  After 6pm go to [email or web address] - password [email or web address] [facility] [place] [facility]  [phone]  CC: Primary care physician; Albina Billet, MD

## 2018-04-30 NOTE — ED Notes (Signed)
Report given to April

## 2018-04-30 NOTE — ED Notes (Signed)
Pt given urinal.

## 2018-04-30 NOTE — ED Notes (Signed)
Called lab and spoke with [facility] to verify that they were running specimens since they were sent down before orders were put in by MD. Meredith Mody states it is running

## 2018-04-30 NOTE — Progress Notes (Signed)
Patient is resident of [facility] as per [place], will clarify with EDRNs   he is a new patient [place] LCSW

## 2018-05-01 ENCOUNTER — Encounter: Payer: Self-pay | Admitting: *Deleted

## 2018-05-01 LAB — BASIC METABOLIC PANEL
ANION GAP: 10 (ref 5–15)
BUN: 15 mg/dL (ref 8–23)
CALCIUM: 8.4 mg/dL — AB (ref 8.9–10.3)
CO2: 28 mmol/L (ref 22–32)
Chloride: 103 mmol/L (ref 98–111)
Creatinine, Ser: 1.45 mg/dL — ABNORMAL HIGH (ref 0.61–1.24)
GFR calc Af Amer: 50 mL/min — ABNORMAL LOW (ref >60)
GFR calc non Af Amer: 43 mL/min — ABNORMAL LOW (ref >60)
GLUCOSE: 131 mg/dL — AB (ref 70–99)
POTASSIUM: 3.6 mmol/L (ref 3.5–5.1)
SODIUM: 141 mmol/L (ref 135–145)

## 2018-05-01 LAB — GLUCOSE, CAPILLARY
GLUCOSE-CAPILLARY: 107 mg/dL — AB (ref 70–99)
GLUCOSE-CAPILLARY: 186 mg/dL — AB (ref 70–99)
Glucose-Capillary: 173 mg/dL — ABNORMAL HIGH (ref 70–99)

## 2018-05-01 LAB — CBC
HEMATOCRIT: 38.5 % — AB (ref 40.0–52.0)
HEMOGLOBIN: 13.7 g/dL (ref 13.0–18.0)
MCH: 31.5 pg (ref 26.0–34.0)
MCHC: 35.5 g/dL (ref 32.0–36.0)
MCV: 88.9 fL (ref 80.0–100.0)
Platelets: 243 10*3/uL (ref 150–440)
RBC: 4.33 MIL/uL — ABNORMAL LOW (ref 4.40–5.90)
RDW: 14.2 % (ref 11.5–14.5)
WBC: 5.3 10*3/uL (ref 3.8–10.6)

## 2018-05-01 MED ORDER — HYDRALAZINE HCL 20 MG/ML IJ SOLN
10.0000 mg | INTRAMUSCULAR | Status: DC | PRN
  Administered 2018-05-02: 10 mg via INTRAVENOUS
  Filled 2018-05-01: qty 1

## 2018-05-01 MED ORDER — METOPROLOL TARTRATE 25 MG PO TABS
12.5000 mg | ORAL_TABLET | Freq: Two times a day (BID) | ORAL | Status: DC
  Administered 2018-05-01 (×2): 12.5 mg via ORAL
  Filled 2018-05-01 (×3): qty 1

## 2018-05-01 MED ORDER — AMLODIPINE BESYLATE 10 MG PO TABS
10.0000 mg | ORAL_TABLET | Freq: Every day | ORAL | Status: DC
  Administered 2018-05-02 – 2018-05-03 (×2): 10 mg via ORAL
  Filled 2018-05-01 (×2): qty 1

## 2018-05-01 NOTE — Clinical Social Work Note (Signed)
Clinical Social Work Assessment  Patient Details  Name: Ian Jimenez MRN: [id number] Date of Birth: [birth year]  Date of referral:  05/01/18               Reason for consult:  Facility Placement                Permission sought to share information with:  Chartered certified accountant granted to share information::  Yes, Verbal Permission Granted  Name::        Agency::  [facility]  Relationship::     Contact Information:     Housing/Transportation Living arrangements for the past 2 months:  [place] of Information:  Patient, Medical Team, Facility Patient Interpreter Needed:  None Criminal Activity/Legal Involvement Pertinent to Current Situation/Hospitalization:  No - Comment as needed Significant Relationships:  Spouse, Adult Children Lives with:  Spouse Do you feel safe going back to the place where you live?  Yes Need for family participation in patient care:  No (Coment)  Care giving concerns:  Patient admitted from the [facility]   Social Worker assessment / plan:  The CSW received confirmation from the patient's medical team that the patient and his family would like for him to discharge to SNF once stable. The CSW contacted Lovena Le at [facility] who has begun admission process for discharge on Monday. The CSW will continue to follow for discharge facilitation.  Employment status:  Retired Forensic scientist:  [employer] PT Recommendations:  [place] / Referral to community resources:  [place]  Patient/Family's Response to care:  The patient has dementia and his primary caregiver is his wife who is blind. The family is in agreement with SNF.  Patient/Family's Understanding of and Emotional Response to Diagnosis, Current Treatment, and Prognosis:  The family understands the discharge plan and are in agreement.  Emotional Assessment Appearance:  Appears stated  age Attitude/Demeanor/Rapport:  Gracious Affect (typically observed):  Pleasant Orientation:  Oriented to Self, Oriented to Place, Oriented to  Time, Oriented to Situation Alcohol / Substance use:  Never Used Psych involvement (Current and /or in the community):  No (Comment)  Discharge Needs  Concerns to be addressed:  Care Coordination, Discharge Planning Concerns Readmission within the last 30 days:  Yes Current discharge risk:  None Barriers to Discharge:  Continued Medical Work up   [employer], LCSW 05/01/2018, 2:12 PM

## 2018-05-01 NOTE — Progress Notes (Signed)
Received return call from Dr. Duane Boston regarding refusal of CBG.No new orders received.Call ended

## 2018-05-01 NOTE — Evaluation (Signed)
Physical Therapy Evaluation Patient Details Name: Ian Jimenez MRN: [id number] DOB: 03/18/1934 Today's Date: 05/01/2018   History of Present Illness   84 yo male who was admitted for suspected TIA after he fell at home, striking of head per wife.  Had very elevated BP in ED, but per pt having dementia could not tell much history to MD.  Imaging cleared him for CVA.  Pt is normally I for gait with no AD, living with wife in ALF.  PMHx:  DM, HTN, dementia, R side weakness and numbness recently  Clinical Impression  Pt was seen for evaluation of mobility and noted his unsteadiness and difficulty to power up.  Pt is home with his wife who is visually impaired and will not be able to assist him this much.  Will work toward a discharge home but expect him to need assistance from a rehab stay to increase control of balance and gait.  Family wishes to take him directly home but may not realize how debilitated he is.  Nursing may assist him with gait to increase his strength as able.    Follow Up Recommendations SNF    Equipment Recommendations  Rolling walker with 5" wheels(if he does not have one at home)    Recommendations for Other Services       Precautions / Restrictions Precautions Precautions: Fall Restrictions Weight Bearing Restrictions: No      Mobility  Bed Mobility Overal bed mobility: Modified Independent(PT monitored for Lines with IV)                Transfers Overall transfer level: Needs assistance Equipment used: 1 person hand held assist Transfers: Sit to/from Stand;Stand Pivot Transfers Sit to Stand: Min assist;Mod assist Stand pivot transfers: Min assist       General transfer comment: mod assist for initial stand and then min to assist pivot steps to chair  Ambulation/Gait             General Gait Details: unable to take a longer walk  Stairs            Wheelchair Mobility    Modified Rankin (Stroke Patients Only)       Balance  Overall balance assessment: Needs assistance Sitting-balance support: Feet supported;Bilateral upper extremity supported Sitting balance-Leahy Scale: Poor     Standing balance support: Single extremity supported;During functional activity Standing balance-Leahy Scale: Poor                               Pertinent Vitals/Pain Pain Assessment: No/denies pain    Home Living Family/patient expects to be discharged to:: Assisted living Living Arrangements: Spouse/significant other             Home Equipment: Walker - 2 wheels Additional Comments: did not use walking device prior to this admission but was in PT for strengthening    Prior Function Level of Independence: Independent;Needs assistance   Gait / Transfers Assistance Needed: I for gait with no AD at home  ADL's / Homemaking Assistance Needed: lives with wife but ALF assists meals and housekeeping  Comments: Has RW but has not needed it     Hand Dominance   Dominant Hand: Right    Extremity/Trunk Assessment   Upper Extremity Assessment Upper Extremity Assessment: Overall WFL for tasks assessed    Lower Extremity Assessment Lower Extremity Assessment: Overall WFL for tasks assessed(pt sits side of bed and leans back gradually)  Cervical / Trunk Assessment Cervical / Trunk Assessment: Other exceptions(trunk mm may be weak in abdominal area)  Communication   Communication: HOH  Cognition Arousal/Alertness: Awake/alert Behavior During Therapy: WFL for tasks assessed/performed Overall Cognitive Status: History of cognitive impairments - at baseline                                 General Comments: pt is following instructions from PT      General Comments General comments (skin integrity, edema, etc.): pt is able to follow instructions from PT but is not doing motor planning well currently to transfer alone    Exercises     Assessment/Plan    PT Assessment Patient needs  continued PT services  PT Problem List Decreased strength;Decreased range of motion;Decreased activity tolerance;Decreased balance;Decreased mobility;Decreased coordination;Decreased cognition;Decreased safety awareness;Obesity       PT Treatment Interventions DME instruction;Gait training;Functional mobility training;Therapeutic activities;Therapeutic exercise;Balance training;Neuromuscular re-education;Patient/family education    PT Goals (Current goals can be found in the Care Plan section)  Acute Rehab PT Goals Patient Stated Goal: none stated but agreed to do therapy today PT Goal Formulation: With family Time For Goal Achievement: 05/15/18 Potential to Achieve Goals: Good    Frequency Min 2X/week   Barriers to discharge Decreased caregiver support wife is home but is visually impaired and cannot care for him    Co-evaluation               AM-PAC PT "6 Clicks" Daily Activity  Outcome Measure Difficulty turning over in bed (including adjusting bedclothes, sheets and blankets)?: A Little Difficulty moving from lying on back to sitting on the side of the bed? : Unable Difficulty sitting down on and standing up from a chair with arms (e.g., wheelchair, bedside commode, etc,.)?: Unable Help needed moving to and from a bed to chair (including a wheelchair)?: A Lot Help needed walking in hospital room?: A Lot Help needed climbing 3-5 steps with a railing? : Total 6 Click Score: 10    End of Session Equipment Utilized During Treatment: Gait belt Activity Tolerance: Patient limited by fatigue Patient left: in chair;with call bell/phone within reach;with chair alarm set;with family/visitor present Nurse Communication: Mobility status PT Visit Diagnosis: Unsteadiness on feet (R26.81);Muscle weakness (generalized) (M62.81);Difficulty in walking, not elsewhere classified (R26.2)    Time: 0240-9735 PT Time Calculation (min) (ACUTE ONLY): 34 min   Charges:   PT Evaluation $PT  Eval Moderate Complexity: 1 Mod PT Treatments $Therapeutic Activity: 8-22 mins       Ramond Dial 05/01/2018, 12:53 PM   Mee Hives, PT MS Acute Rehab Dept. Number: [place]

## 2018-05-01 NOTE — Progress Notes (Signed)
[place] [facility] [place] NAME: Ian Jimenez    MR#:  [id number]  DATE OF BIRTH:  [birth year]  SUBJECTIVE:  CHIEF COMPLAINT:   Chief Complaint  Patient presents with  . Cerebrovascular Accident  . Fall  Patient without complaint, wife at the bedside, family friend present, for skilled nursing facility placement on Monday  REVIEW OF SYSTEMS:  CONSTITUTIONAL: No fever, fatigue or weakness.  EYES: No blurred or double vision.  EARS, NOSE, AND THROAT: No tinnitus or ear pain.  RESPIRATORY: No cough, shortness of breath, wheezing or hemoptysis.  CARDIOVASCULAR: No chest pain, orthopnea, edema.  GASTROINTESTINAL: No nausea, vomiting, diarrhea or abdominal pain.  GENITOURINARY: No dysuria, hematuria.  ENDOCRINE: No polyuria, nocturia,  HEMATOLOGY: No anemia, easy bruising or bleeding SKIN: No rash or lesion. MUSCULOSKELETAL: No joint pain or arthritis.   NEUROLOGIC: No tingling, numbness, weakness.  PSYCHIATRY: No anxiety or depression.   ROS  DRUG ALLERGIES:  No Known Allergies  VITALS:  Blood pressure (!) 146/70, pulse 73, temperature 97.7 F (36.5 C), temperature source Oral, resp. rate 14, height 5\' 8"  (1.727 m), weight 67.2 kg, SpO2 97 %.  PHYSICAL EXAMINATION:  GENERAL:  84 y.o.-year-old patient lying in the bed with no acute distress.  EYES: Pupils equal, round, reactive to light and accommodation. No scleral icterus. Extraocular muscles intact.  HEENT: Head atraumatic, normocephalic. Oropharynx and nasopharynx clear.  NECK:  Supple, no jugular venous distention. No thyroid enlargement, no tenderness.  LUNGS: Normal breath sounds bilaterally, no wheezing, rales,rhonchi or crepitation. No use of accessory muscles of respiration.  CARDIOVASCULAR: S1, S2 normal. No murmurs, rubs, or gallops.  ABDOMEN: Soft, nontender, nondistended. Bowel sounds present. No organomegaly or mass.  EXTREMITIES: No pedal edema, cyanosis, or clubbing.   NEUROLOGIC: Cranial nerves II through XII are intact. Muscle strength 5/5 in all extremities. Sensation intact. Gait not checked.  PSYCHIATRIC: The patient is alert and oriented x 3.  SKIN: No obvious rash, lesion, or ulcer.   Physical Exam LABORATORY PANEL:   CBC Recent Labs  Lab 05/01/18 0905  WBC 5.3  HGB 13.7  HCT 38.5*  PLT 243   ------------------------------------------------------------------------------------------------------------------  Chemistries  Recent Labs  Lab 04/30/18 1730 05/01/18 0905  NA 140 141  K 4.3 3.6  CL 103 103  CO2 28 28  GLUCOSE 95 131*  BUN 17 15  CREATININE 1.63* 1.45*  CALCIUM 8.8* 8.4*  AST 22  --   ALT 12  --   ALKPHOS 68  --   BILITOT 0.4  --    ------------------------------------------------------------------------------------------------------------------  Cardiac Enzymes Recent Labs  Lab 04/29/18 2113 04/30/18 1730  TROPONINI <0.03 <0.03   ------------------------------------------------------------------------------------------------------------------  RADIOLOGY:  Ct Head Wo Contrast  Result Date: 04/30/2018 CLINICAL DATA:  Possible TIA. Recently started Plavix, fell earlier today. Dementia. EXAM: CT HEAD WITHOUT CONTRAST TECHNIQUE: Contiguous axial images were obtained from the base of the skull through the vertex without intravenous contrast. COMPARISON:  04/29/2018. FINDINGS: Brain: No evidence for acute infarction, hemorrhage, mass lesion, hydrocephalus, or extra-axial fluid. Generalized atrophy. Hypoattenuation of white matter, likely small vessel disease. Vascular: Calcification of the cavernous internal carotid arteries consistent with cerebrovascular atherosclerotic disease. No signs of intracranial large vessel occlusion. Skull: Calvarium intact. Sinuses/Orbits: Chronic sinus disease.  Negative orbits. Other: BILATERAL TMJ arthritis. No mastoid fluid. No significant scalp hematoma. IMPRESSION: Atrophy and small  vessel disease similar to priors. No acute intracranial findings. No change from yesterday's exam. Calcific cerebrovascular atherosclerosis. Electronically Signed  By: Staci Righter M.D.   On: 04/30/2018 18:09   Ct Head Wo Contrast  Result Date: 04/29/2018 CLINICAL DATA:  84 year old male with dementia. EXAM: CT HEAD WITHOUT CONTRAST TECHNIQUE: Contiguous axial images were obtained from the base of the skull through the vertex without intravenous contrast. COMPARISON:  Brain MRI dated 04/10/2018 FINDINGS: Brain: Mild age-related atrophy and moderate chronic microvascular ischemic changes. There is no acute intracranial hemorrhage. No mass effect or midline shift. No extra-axial fluid collection. Vascular: No hyperdense vessel or unexpected calcification. Skull: Normal. Negative for fracture or focal lesion. Sinuses/Orbits: Diffuse mucoperiosteal thickening of paranasal sinuses. The mastoid air cells are clear. Other: None IMPRESSION: 1. No acute intracranial pathology. 2. Age-related atrophy and chronic microvascular ischemic changes. Electronically Signed   By: Anner Crete M.D.   On: 04/29/2018 22:17   Dg Chest Portable 1 View  Result Date: 04/29/2018 CLINICAL DATA:  84 year old male with history of diabetes and hypertension presenting with cough and vomiting. EXAM: PORTABLE CHEST 1 VIEW COMPARISON:  None. FINDINGS: No focal consolidation, pleural effusion, or pneumothorax. Mild cardiomegaly. No acute osseous pathology. IMPRESSION: No acute cardiopulmonary process. Cardiomegaly. Electronically Signed   By: Anner Crete M.D.   On: 04/29/2018 22:11    ASSESSMENT AND PLAN:  *Acute hypertensive urgency  Improving  Increase Norvasc, start beta-blocker therapy, IV hydralazine as needed, vitals per routine, make changes as per necessary   *Acute hyperkalemia  Kayexalate p.o. x1, Veltassa daily, BMP in the morning  *CKD 3 Near baseline- baseline 1.6 Avoid nephrotoxic agents  *Chronic  diabetes mellitus type 2 Sliding scale insulin with Accu-Cheks per routine  *Chronic dementia Stable Continue Aricept, increase nursing care PRN, aspiration/fall/skin care precautions while in house Physical therapy recommending skilled nursing facility   Disposition to skilled nursing facility on Monday barring any complications  All the records are reviewed and case discussed with Care Management/Social Workerr. Management plans discussed with the patient, family and they are in agreement.  CODE STATUS: dnr  TOTAL TIME TAKING CARE OF THIS PATIENT: 40 minutes.     POSSIBLE D/C IN 2 DAYS, DEPENDING ON CLINICAL CONDITION.   Avel Peace Salary M.D on 05/01/2018   Between 7am to 6pm - Pager - [phone]  After 6pm go to [email or web address] - password [email or web address] [facility]  Office  [phone]  CC: Primary care physician; Albina Billet, MD  Note: This dictation was prepared with Dragon dictation along with smaller phrase technology. Any transcriptional errors that result from this process are unintentional.

## 2018-05-01 NOTE — NC FL2 (Signed)
[place] LEVEL OF CARE SCREENING TOOL     IDENTIFICATION  Patient Name: Ian Jimenez Birthdate: 1934-06-11 Sex: male Admission Date (Current Location): 04/30/2018  [place] and [place] Number:  Engineering geologist and Address:  [facility], [address], [place], [place] [address]      Provider Number: [id number]  Attending Physician Name and Address:  Gorden Harms, MD  Relative Name and Phone Number:  Lemoine Goyne: Spouse [phone])    Current Level of Care: Hospital Recommended Level of Care: [place] Prior Approval Number:    Date Approved/Denied:   PASRR Number: [id number]  Discharge Plan: SNF    Current Diagnoses: Patient Active Problem List   Diagnosis Date Noted  . Hypertensive urgency 04/30/2018  . TIA (transient ischemic attack) 04/10/2018    Orientation RESPIRATION BLADDER Height & Weight     Self, Situation, Place  Normal Incontinent Weight: 148 lb 2.4 oz (67.2 kg) Height:  5\' 8"  (172.7 cm)  BEHAVIORAL SYMPTOMS/MOOD NEUROLOGICAL BOWEL NUTRITION STATUS      Continent Diet(heart healthy/carb modified)  AMBULATORY STATUS COMMUNICATION OF NEEDS Skin   Extensive Assist Verbally Normal                       Personal Care Assistance Level of Assistance  Bathing, Feeding, Dressing Bathing Assistance: Limited assistance Feeding assistance: Independent Dressing Assistance: Limited assistance     Functional Limitations Info  Sight, Hearing, Speech Sight Info: Adequate Hearing Info: Adequate Speech Info: Adequate    SPECIAL CARE FACTORS FREQUENCY  PT (By licensed PT)     PT Frequency: Up to 5X per week              Contractures Contractures Info: Not present    Additional Factors Info  Code Status, Allergies, Psychotropic, Insulin Sliding Scale Code Status Info: DNR Allergies Info: No known allergies Psychotropic Info: Trazadone Insulin Sliding Scale  Info: Novolog: 1-15 units TID with meals       Current Medications (05/01/2018):  This is the current hospital active medication list Current Facility-Administered Medications  Medication Dose Route Frequency Provider Last Rate Last Dose  . acetaminophen (TYLENOL) tablet 650 mg  650 mg Oral Q6H PRN Amelia Jo, MD       Or  . acetaminophen (TYLENOL) suppository 650 mg  650 mg Rectal Q6H PRN Amelia Jo, MD      . Derrill Memo ON 05/02/2018] amLODipine (NORVASC) tablet 10 mg  10 mg Oral Daily Salary, Montell D, MD      . bisacodyl (DULCOLAX) EC tablet 5 mg  5 mg Oral Daily PRN Amelia Jo, MD      . clopidogrel (PLAVIX) tablet 75 mg  75 mg Oral Daily Amelia Jo, MD   75 mg at 05/01/18 1012  . docusate sodium (COLACE) capsule 100 mg  100 mg Oral BID Amelia Jo, MD   100 mg at 05/01/18 1012  . heparin injection 5,000 Units  5,000 Units Subcutaneous Q8H Amelia Jo, MD   5,000 Units at 05/01/18 1412  . hydrALAZINE (APRESOLINE) injection 10 mg  10 mg Intravenous Q4H PRN Salary, Montell D, MD      . HYDROcodone-acetaminophen (NORCO/VICODIN) 5-325 MG per tablet 1-2 tablet  1-2 tablet Oral Q4H PRN Amelia Jo, MD      . insulin aspart (novoLOG) injection 0-15 Units  0-15 Units Subcutaneous TID WC Amelia Jo, MD   3 Units at 05/01/18 1222  . insulin aspart (  novoLOG) injection 0-5 Units  0-5 Units Subcutaneous QHS Amelia Jo, MD      . metoprolol tartrate (LOPRESSOR) tablet 12.5 mg  12.5 mg Oral BID Salary, Montell D, MD   12.5 mg at 05/01/18 1412  . ondansetron (ZOFRAN) tablet 4 mg  4 mg Oral Q6H PRN Amelia Jo, MD       Or  . ondansetron [facility]) injection 4 mg  4 mg Intravenous Q6H PRN Amelia Jo, MD      . traZODone (DESYREL) tablet 25 mg  25 mg Oral QHS PRN Amelia Jo, MD         Discharge Medications: Please see discharge summary for a list of discharge medications.  Relevant Imaging Results:  Relevant Lab Results:   Additional  Information SS#[id number]  Zettie Pho, LCSW

## 2018-05-02 LAB — GLUCOSE, CAPILLARY
GLUCOSE-CAPILLARY: 172 mg/dL — AB (ref 70–99)
GLUCOSE-CAPILLARY: 182 mg/dL — AB (ref 70–99)
Glucose-Capillary: 251 mg/dL — ABNORMAL HIGH (ref 70–99)

## 2018-05-02 MED ORDER — HYDRALAZINE HCL 25 MG PO TABS
25.0000 mg | ORAL_TABLET | Freq: Three times a day (TID) | ORAL | Status: DC
  Administered 2018-05-02 – 2018-05-03 (×4): 25 mg via ORAL
  Filled 2018-05-02 (×4): qty 1

## 2018-05-02 MED ORDER — METOPROLOL TARTRATE 25 MG PO TABS
25.0000 mg | ORAL_TABLET | Freq: Two times a day (BID) | ORAL | Status: DC
  Administered 2018-05-02 – 2018-05-03 (×3): 25 mg via ORAL
  Filled 2018-05-02 (×3): qty 1

## 2018-05-02 NOTE — Progress Notes (Signed)
[place] at [place] NAME: Ian Jimenez    MR#:  [id number]  DATE OF BIRTH:  Nov 18, 1933  SUBJECTIVE:  CHIEF COMPLAINT:   Chief Complaint  Patient presents with  . Cerebrovascular Accident  . Fall  Patient without complaint, wife at the bedside-all questions answered REVIEW OF SYSTEMS:  CONSTITUTIONAL: No fever, fatigue or weakness.  EYES: No blurred or double vision.  EARS, NOSE, AND THROAT: No tinnitus or ear pain.  RESPIRATORY: No cough, shortness of breath, wheezing or hemoptysis.  CARDIOVASCULAR: No chest pain, orthopnea, edema.  GASTROINTESTINAL: No nausea, vomiting, diarrhea or abdominal pain.  GENITOURINARY: No dysuria, hematuria.  ENDOCRINE: No polyuria, nocturia,  HEMATOLOGY: No anemia, easy bruising or bleeding SKIN: No rash or lesion. MUSCULOSKELETAL: No joint pain or arthritis.   NEUROLOGIC: No tingling, numbness, weakness.  PSYCHIATRY: No anxiety or depression.   ROS  DRUG ALLERGIES:  No Known Allergies  VITALS:  Blood pressure (!) 134/55, pulse 63, temperature 98.4 F (36.9 C), temperature source Oral, resp. rate 12, height 5\' 8"  (1.727 m), weight 69 kg, SpO2 97 %.  PHYSICAL EXAMINATION:  GENERAL:  84 y.o.-year-old patient lying in the bed with no acute distress.  EYES: Pupils equal, round, reactive to light and accommodation. No scleral icterus. Extraocular muscles intact.  HEENT: Head atraumatic, normocephalic. Oropharynx and nasopharynx clear.  NECK:  Supple, no jugular venous distention. No thyroid enlargement, no tenderness.  LUNGS: Normal breath sounds bilaterally, no wheezing, rales,rhonchi or crepitation. No use of accessory muscles of respiration.  CARDIOVASCULAR: S1, S2 normal. No murmurs, rubs, or gallops.  ABDOMEN: Soft, nontender, nondistended. Bowel sounds present. No organomegaly or mass.  EXTREMITIES: No pedal edema, cyanosis, or clubbing.  NEUROLOGIC: Cranial nerves II through XII are intact. Muscle  strength 5/5 in all extremities. Sensation intact. Gait not checked.  PSYCHIATRIC: The patient is alert and oriented x 3.  SKIN: No obvious rash, lesion, or ulcer.   Physical Exam LABORATORY PANEL:   CBC Recent Labs  Lab 05/01/18 0905  WBC 5.3  HGB 13.7  HCT 38.5*  PLT 243   ------------------------------------------------------------------------------------------------------------------  Chemistries  Recent Labs  Lab 04/30/18 1730 05/01/18 0905  NA 140 141  K 4.3 3.6  CL 103 103  CO2 28 28  GLUCOSE 95 131*  BUN 17 15  CREATININE 1.63* 1.45*  CALCIUM 8.8* 8.4*  AST 22  --   ALT 12  --   ALKPHOS 68  --   BILITOT 0.4  --    ------------------------------------------------------------------------------------------------------------------  Cardiac Enzymes Recent Labs  Lab 04/29/18 2113 04/30/18 1730  TROPONINI <0.03 <0.03   ------------------------------------------------------------------------------------------------------------------  RADIOLOGY:  Ct Head Wo Contrast  Result Date: 04/30/2018 CLINICAL DATA:  Possible TIA. Recently started Plavix, fell earlier today. Dementia. EXAM: CT HEAD WITHOUT CONTRAST TECHNIQUE: Contiguous axial images were obtained from the base of the skull through the vertex without intravenous contrast. COMPARISON:  04/29/2018. FINDINGS: Brain: No evidence for acute infarction, hemorrhage, mass lesion, hydrocephalus, or extra-axial fluid. Generalized atrophy. Hypoattenuation of white matter, likely small vessel disease. Vascular: Calcification of the cavernous internal carotid arteries consistent with cerebrovascular atherosclerotic disease. No signs of intracranial large vessel occlusion. Skull: Calvarium intact. Sinuses/Orbits: Chronic sinus disease.  Negative orbits. Other: BILATERAL TMJ arthritis. No mastoid fluid. No significant scalp hematoma. IMPRESSION: Atrophy and small vessel disease similar to priors. No acute intracranial findings.  No change from yesterday's exam. Calcific cerebrovascular atherosclerosis. Electronically Signed   By: Staci Righter M.D.   On:  04/30/2018 18:09    ASSESSMENT AND PLAN:  *Acute hypertensive urgency  Solved Continue Norvasc, increase Lopressor, p.o. hydralazine, continue IV hydralazine as needed, vitals per routine, make changes as per necessary   *Acute hyperkalemia  Resolved with Kayexalate Continue Veltassa for 1-2 more days   *CKD 3 At baseline, currently 1.4- baseline 1.6 Avoid nephrotoxic agents  *Chronic diabetes mellitus type 2 Stable Sliding scale insulin with Accu-Cheks per routine  *Chronic dementia Stable Continue Aricept, nursing care PRN, aspiration/fall/skin care precautions while in house Physical therapy recommending skilled nursing facility   Disposition to skilled nursing facility on Monday barring any complications  All the records are reviewed and case discussed with Care Management/Social Workerr. Management plans discussed with the patient, family and they are in agreement.  CODE STATUS: dnr  TOTAL TIME TAKING CARE OF THIS PATIENT: 40 minutes.     POSSIBLE D/C IN 2 DAYS, DEPENDING ON CLINICAL CONDITION.   Ian Jimenez M.D on 05/02/2018   Between 7am to 6pm - Pager - [phone]  After 6pm go to [email or web address] - password [email or web address] [place] [facility]  Office  [phone]  CC: Primary care physician; Ian Billet, MD  Note: This dictation was prepared with Dragon dictation along with smaller phrase technology. Any transcriptional errors that result from this process are unintentional.

## 2018-05-02 NOTE — Plan of Care (Signed)
Still refusing finger sticks intermittently to check BS MD made aware.Will continue to monitor per protocol.

## 2018-05-02 NOTE — Clinical Social Work Note (Signed)
CSW met with the family at bedside to discuss discharge planning (SNF vs HH). The CSW answered all questions and explained the process of discharge to SNF as well as the reasoning for the recommendation. The patient, his wife, his daughter, family friends, his son-in-law, and his granddaughter all agree with discharge to SNF tomorrow via family transport. The CSW advised the patient's daughter of the procedures for discharge and provided the contact information for the unit CSW who will be following the patient tomorrow. The CSW will continue to follow for discharge tomorrow to [facility].  Santiago Bumpers, MSW, Latanya Presser [phone]

## 2018-05-03 ENCOUNTER — Encounter
Admission: RE | Admit: 2018-05-03 | Discharge: 2018-05-03 | Disposition: A | Discharge status: Home / Self Care | Payer: Medicare Other | Source: Ambulatory Visit | Attending: Internal Medicine | Admitting: Internal Medicine

## 2018-05-03 LAB — GLUCOSE, CAPILLARY
Glucose-Capillary: 170 mg/dL — ABNORMAL HIGH (ref 70–99)
Glucose-Capillary: 212 mg/dL — ABNORMAL HIGH (ref 70–99)

## 2018-05-03 MED ORDER — ACETAMINOPHEN 325 MG PO TABS: 650.0000 mg | ORAL_TABLET | Freq: Four times a day (QID) | ORAL | 0 refills | Status: DC | PRN

## 2018-05-03 MED ORDER — METOPROLOL TARTRATE 25 MG PO TABS: 25.0000 mg | ORAL_TABLET | Freq: Two times a day (BID) | ORAL | 0 refills | Status: DC

## 2018-05-03 MED ORDER — HYDRALAZINE HCL 25 MG PO TABS: 25.0000 mg | ORAL_TABLET | Freq: Three times a day (TID) | ORAL | 0 refills | Status: DC

## 2018-05-03 MED ORDER — AMLODIPINE BESYLATE 10 MG PO TABS: 10.0000 mg | ORAL_TABLET | Freq: Every day | ORAL | 0 refills | Status: DC

## 2018-05-03 NOTE — Care Management Important Message (Signed)
Copy of signed IM left with patient in room.  

## 2018-05-03 NOTE — Progress Notes (Signed)
Report called to [place] LPN at [facility]. Questions asked and answered. Pt's family to transport pt via personal vehicle. Provided discharge packet to family. Educated that staff at facility stated to go to the entrance that the pt's wife uses and call and staff will meet them.

## 2018-05-03 NOTE — Progress Notes (Signed)
Nsg Discharge Note  Admit Date:  04/30/2018 Discharge date: 05/03/2018   Ian Jimenez to be D/C'd Skilled nursing facility per MD order.  AVS completed.  Copy for chart, and copy for patient signed, and dated. Patient/caregiver able to verbalize understanding.  Discharge Medication: Allergies as of 05/03/2018   No Known Allergies     Medication List    STOP taking these medications   lisinopril-hydrochlorothiazide 20-12.5 MG tablet Commonly known as:  PRINZIDE,ZESTORETIC     TAKE these medications   acetaminophen 325 MG tablet Commonly known as:  TYLENOL Take 2 tablets (650 mg total) by mouth every 6 (six) hours as needed for mild pain (or Fever >/= 101).   amLODipine 10 MG tablet Commonly known as:  NORVASC Take 1 tablet (10 mg total) by mouth daily. Start taking on:  05/04/2018 What changed:    medication strength  how much to take   clopidogrel 75 MG tablet Commonly known as:  PLAVIX Take 1 tablet (75 mg total) by mouth daily.   donepezil 10 MG tablet Commonly known as:  ARICEPT Take 10 mg by mouth at bedtime.   escitalopram 5 MG tablet Commonly known as:  LEXAPRO Take 5 mg by mouth daily.   glipiZIDE 5 MG tablet Commonly known as:  GLUCOTROL Take 1 tablet (5 mg total) by mouth 2 (two) times daily before a meal.   hydrALAZINE 25 MG tablet Commonly known as:  APRESOLINE Take 1 tablet (25 mg total) by mouth every 8 (eight) hours.   metFORMIN 1000 MG tablet Commonly known as:  GLUCOPHAGE Take 1,000 mg by mouth 2 (two) times daily with a meal.   metoprolol tartrate 25 MG tablet Commonly known as:  LOPRESSOR Take 1 tablet (25 mg total) by mouth 2 (two) times daily.   simvastatin 20 MG tablet Commonly known as:  ZOCOR Take 20 mg by mouth daily.       Discharge Assessment: Vitals:   05/02/18 1943 05/03/18 0446  BP: (!) 139/50 (!) 142/57  Pulse: 75 69  Resp: 20 16  Temp: 98.3 F (36.8 C) 98.4 F (36.9 C)  SpO2: 95% 95%   Skin clean, dry and intact  without evidence of skin break down, no evidence of skin tears noted. IV catheter discontinued intact. Site without signs and symptoms of complications - no redness or edema noted at insertion site, patient denies c/o pain - only slight tenderness at site.  Dressing with slight pressure applied.  D/c Instructions-Education: Discharge instructions given to patient/family with verbalized understanding.Faxed D/c instructions to SNF. Patient/family instructed to return to ED, call 911, or call MD for any changes in condition.  Patient escorted via [place], and D/C to SNF via private auto.  Eda Keys, RN 05/03/2018 1:52 PM

## 2018-05-03 NOTE — Discharge Summary (Signed)
[facility] [place] NAME: Ian Jimenez    MR#:  [id number]  DATE OF BIRTH:  02-07-1934  DATE OF ADMISSION:  04/30/2018 ADMITTING PHYSICIAN: Amelia Jo, MD  DATE OF DISCHARGE: No discharge date for patient encounter.  PRIMARY CARE PHYSICIAN: Albina Billet, MD    ADMISSION DIAGNOSIS:  Hypertensive urgency [I16.0]  DISCHARGE DIAGNOSIS:  Active Problems:   Hypertensive urgency   SECONDARY DIAGNOSIS:   Past Medical History:  Diagnosis Date  . Dementia   . Diabetes mellitus without complication ([place])   . Hypertension     HOSPITAL COURSE:  *Acute hypertensive urgency  Resolved Continue Norvasc, Lopressor, hydralazine, and follow-up with primary care provider in 3 to 5 days for reevaluation   *Acute hyperkalemia  Resolved with Kayexalate and Veltassa  *CKD 3 At baseline, currently 1.4- baseline 1.6 Avoid nephrotoxic agents  *Chronic diabetes mellitus type 2 Stable Treated with sliding scale insulin with Accu-Cheks per routine  *Chronic dementia Stable Continued Aricept, nursing care PRN, aspiration/fall/skin care precautions while in house Physical therapy recommended skilled nursing facility   DISCHARGE CONDITIONS:   stable  CONSULTS OBTAINED:    DRUG ALLERGIES:  No Known Allergies  DISCHARGE MEDICATIONS:   Allergies as of 05/03/2018   No Known Allergies     Medication List    STOP taking these medications   lisinopril-hydrochlorothiazide 20-12.5 MG tablet Commonly known as:  PRINZIDE,ZESTORETIC     TAKE these medications   acetaminophen 325 MG tablet Commonly known as:  TYLENOL Take 2 tablets (650 mg total) by mouth every 6 (six) hours as needed for mild pain (or Fever >/= 101).   amLODipine 10 MG tablet Commonly known as:  NORVASC Take 1 tablet (10 mg total) by mouth daily. Start taking on:  05/04/2018 What changed:    medication strength  how much to take   clopidogrel 75 MG  tablet Commonly known as:  PLAVIX Take 1 tablet (75 mg total) by mouth daily.   donepezil 10 MG tablet Commonly known as:  ARICEPT Take 10 mg by mouth at bedtime.   escitalopram 5 MG tablet Commonly known as:  LEXAPRO Take 5 mg by mouth daily.   glipiZIDE 5 MG tablet Commonly known as:  GLUCOTROL Take 1 tablet (5 mg total) by mouth 2 (two) times daily before a meal.   hydrALAZINE 25 MG tablet Commonly known as:  APRESOLINE Take 1 tablet (25 mg total) by mouth every 8 (eight) hours.   metFORMIN 1000 MG tablet Commonly known as:  GLUCOPHAGE Take 1,000 mg by mouth 2 (two) times daily with a meal.   metoprolol tartrate 25 MG tablet Commonly known as:  LOPRESSOR Take 1 tablet (25 mg total) by mouth 2 (two) times daily.   simvastatin 20 MG tablet Commonly known as:  ZOCOR Take 20 mg by mouth daily.        DISCHARGE INSTRUCTIONS:    If you experience worsening of your admission symptoms, develop shortness of breath, life threatening emergency, suicidal or homicidal thoughts you must seek medical attention immediately by calling 911 or calling your MD immediately  if symptoms less severe.  You Must read complete instructions/literature along with all the possible adverse reactions/side effects for all the Medicines you take and that have been prescribed to you. Take any new Medicines after you have completely understood and accept all the possible adverse reactions/side effects.   Please note  You were cared for by a hospitalist during your  hospital stay. If you have any questions about your discharge medications or the care you received while you were in the hospital after you are discharged, you can call the unit and asked to speak with the hospitalist on call if the hospitalist that took care of you is not available. Once you are discharged, your primary care physician will handle any further medical issues. Please note that NO REFILLS for any discharge medications will be  authorized once you are discharged, as it is imperative that you return to your primary care physician (or establish a relationship with a primary care physician if you do not have one) for your aftercare needs so that they can reassess your need for medications and monitor your lab values.    Today   CHIEF COMPLAINT:   Chief Complaint  Patient presents with  . Cerebrovascular Accident  . Fall    HISTORY OF PRESENT ILLNESS:   84 y.o. male with a known history of dementia, diabetes type 2 and hypertension. Patient was brought to emergency room for evaluation status post mechanical fall at home.  Per patient's wife, he did hit his head when falling.  No reports of loss of consciousness, no fever, no chills, no chest pain, no vomiting, no diarrhea, no bleeding. Patient is unable to provide history due to his dementia.  Most of the information was taken from reviewing the medical chart and from discussion with emergency room physician. Blood test done emergency room, including CBC and CMP are grossly unremarkable, except for creatinine level of 1.63, which seems to be the baseline for this patient. At the arrival to emergency room patient was noted with elevated blood pressure at 201/88. No acute intracranial pathology per brain CT. Chest x-rays negative for acute cardiopulmonary process. Patient is admitted for further evaluation and treatment.  VITAL SIGNS:  Blood pressure (!) 142/57, pulse 69, temperature 98.4 F (36.9 C), temperature source Oral, resp. rate 16, height 5\' 8"  (1.727 m), weight 69.4 kg, SpO2 95 %.  I/O:    Intake/Output Summary (Last 24 hours) at 05/03/2018 1057 Last data filed at 05/03/2018 0900 Gross per 24 hour  Intake 600 ml  Output 600 ml  Net 0 ml    PHYSICAL EXAMINATION:  GENERAL:  84 y.o.-year-old patient lying in the bed with no acute distress.  EYES: Pupils equal, round, reactive to light and accommodation. No scleral icterus. Extraocular muscles intact.   HEENT: Head atraumatic, normocephalic. Oropharynx and nasopharynx clear.  NECK:  Supple, no jugular venous distention. No thyroid enlargement, no tenderness.  LUNGS: Normal breath sounds bilaterally, no wheezing, rales,rhonchi or crepitation. No use of accessory muscles of respiration.  CARDIOVASCULAR: S1, S2 normal. No murmurs, rubs, or gallops.  ABDOMEN: Soft, non-tender, non-distended. Bowel sounds present. No organomegaly or mass.  EXTREMITIES: No pedal edema, cyanosis, or clubbing.  NEUROLOGIC: Cranial nerves II through XII are intact. Muscle strength 5/5 in all extremities. Sensation intact. Gait not checked.  PSYCHIATRIC: The patient is alert and oriented x 3.  SKIN: No obvious rash, lesion, or ulcer.   DATA REVIEW:   CBC Recent Labs  Lab 05/01/18 0905  WBC 5.3  HGB 13.7  HCT 38.5*  PLT 243    Chemistries  Recent Labs  Lab 04/30/18 1730 05/01/18 0905  NA 140 141  K 4.3 3.6  CL 103 103  CO2 28 28  GLUCOSE 95 131*  BUN 17 15  CREATININE 1.63* 1.45*  CALCIUM 8.8* 8.4*  AST 22  --   ALT  12  --   ALKPHOS 68  --   BILITOT 0.4  --     Cardiac Enzymes Recent Labs  Lab 04/30/18 1730  TROPONINI <0.03    Microbiology Results  No results found for this or any previous visit.  RADIOLOGY:  No results found.  EKG:   Orders placed or performed during the hospital encounter of 04/30/18  . EKG 12-Lead  . EKG 12-Lead  . ED EKG  . ED EKG      Management plans discussed with the patient, family and they are in agreement.  CODE STATUS:     Code Status Orders  (From admission, onward)         Start     Ordered   04/30/18 2315  Do not attempt resuscitation (DNR)  Continuous    Question Answer Comment  In the event of cardiac or respiratory ARREST Do not call a "code blue"   In the event of cardiac or respiratory ARREST Do not perform Intubation, CPR, defibrillation or ACLS   In the event of cardiac or respiratory ARREST Use medication by any route,  position, wound care, and other measures to relive pain and suffering. May use oxygen, suction and manual treatment of airway obstruction as needed for comfort.      04/30/18 2314        Code Status History    Date Active Date Inactive Code Status Order ID Comments User Context   04/10/2018 1830 04/11/2018 1600 DNR [id number]  Saundra Shelling, MD Inpatient   04/10/2018 1620 04/10/2018 1830 Full Code [id number]  Saundra Shelling, MD ED    Advance Directive Documentation     Most Recent Value  Type of Advance Directive  Healthcare Power of [place], Living will  Pre-existing out of facility DNR order (yellow form or pink MOST form)  -  "MOST" Form in Place?  -      TOTAL TIME TAKING CARE OF THIS PATIENT: 35 minutes.    Avel Peace Salary M.D on 05/03/2018 at 10:57 AM  Between 7am to 6pm - Pager - [phone]  After 6pm go to [email or web address] - password [email or web address]  Office  [phone]  CC: Primary care physician; Albina Billet, MD   Note: This dictation was prepared with Dragon dictation along with smaller phrase technology. Any transcriptional errors that result from this process are unintentional.

## 2018-05-06 ENCOUNTER — Encounter: Payer: Self-pay | Admitting: Adult Health

## 2018-05-06 ENCOUNTER — Non-Acute Institutional Stay (SKILLED_NURSING_FACILITY): Payer: Medicare Other | Admitting: Adult Health

## 2018-05-06 DIAGNOSIS — G459 Transient cerebral ischemic attack, unspecified: Secondary | ICD-10-CM | POA: Diagnosis not present

## 2018-05-06 DIAGNOSIS — E1159 Type 2 diabetes mellitus with other circulatory complications: Secondary | ICD-10-CM | POA: Insufficient documentation

## 2018-05-06 DIAGNOSIS — F329 Major depressive disorder, single episode, unspecified: Secondary | ICD-10-CM

## 2018-05-06 DIAGNOSIS — E1169 Type 2 diabetes mellitus with other specified complication: Secondary | ICD-10-CM | POA: Insufficient documentation

## 2018-05-06 DIAGNOSIS — E1122 Type 2 diabetes mellitus with diabetic chronic kidney disease: Secondary | ICD-10-CM

## 2018-05-06 DIAGNOSIS — F0393 Unspecified dementia, unspecified severity, with mood disturbance: Secondary | ICD-10-CM | POA: Insufficient documentation

## 2018-05-06 DIAGNOSIS — K219 Gastro-esophageal reflux disease without esophagitis: Secondary | ICD-10-CM | POA: Insufficient documentation

## 2018-05-06 DIAGNOSIS — I10 Essential (primary) hypertension: Secondary | ICD-10-CM

## 2018-05-06 DIAGNOSIS — F0150 Vascular dementia without behavioral disturbance: Secondary | ICD-10-CM | POA: Insufficient documentation

## 2018-05-06 DIAGNOSIS — N183 Chronic kidney disease, stage 3 unspecified: Secondary | ICD-10-CM | POA: Insufficient documentation

## 2018-05-06 DIAGNOSIS — F0280 Dementia in other diseases classified elsewhere without behavioral disturbance: Secondary | ICD-10-CM

## 2018-05-06 DIAGNOSIS — E785 Hyperlipidemia, unspecified: Secondary | ICD-10-CM

## 2018-05-06 NOTE — Progress Notes (Addendum)
Location:   [facility] Room Number: [place] of Service:  SNF (31)   CODE STATUS: DNR  No Known Allergies  Chief Complaint  Patient presents with  . Hospitalization Follow-up    Hospital Follow up    HPI:  He is a 84 year old man who in early Sept 2019 was diagnosed with TIA. He suffered a mechanical at home on 04-30-18. He was taken to the ED and found to have a severely high blood pressure of 102/88. His ct scans did not demonstrate any acute processes. He is here for short term rehab with his goal to return back home. He denies any chest pain; no vertigo; no headaches. His daughter states that the zocor causes lower extremity weakness. He will continue to be followed for his chronic illnesses including: hypertension; diabetes; and dementia.   Past Medical History:  Diagnosis Date  . Dementia ([place])   . Diabetes mellitus without complication ([place])   . Hypertension     Past Surgical History:  Procedure Laterality Date  . none      Social History   Socioeconomic History  . Marital status: Married    Spouse name: Not on file  . Number of children: Not on file  . Years of education: Not on file  . Highest education level: Not on file  Occupational History  . Occupation: retired  Scientific laboratory technician  . Financial resource strain: Not on file  . Food insecurity:    Worry: Not on file    Inability: Not on file  . Transportation needs:    Medical: Not on file    Non-medical: Not on file  Tobacco Use  . Smoking status: Never Smoker  . Smokeless tobacco: Never Used  Substance and Sexual Activity  . Alcohol use: Never    Frequency: Never  . Drug use: Never  . Sexual activity: Not on file  Lifestyle  . Physical activity:    Days per week: Not on file    Minutes per session: Not on file  . Stress: Not on file  Relationships  . Social connections:    Talks on phone: Not on file    Gets together: Not on file    Attends religious service: Not  on file    Active member of club or organization: Not on file    Attends meetings of clubs or organizations: Not on file    Relationship status: Not on file  . Intimate partner violence:    Fear of current or ex partner: Not on file    Emotionally abused: Not on file    Physically abused: Not on file    Forced sexual activity: Not on file  Other Topics Concern  . Not on file  Social History Narrative  . Not on file   Family History  Problem Relation Age of Onset  . Heart disease Father       VITAL SIGNS BP 104/72   Pulse 95   Temp 97.8 F (36.6 C)   Resp 20   Ht 5\' 8"  (1.727 m)   Wt 153 lb (69.4 kg)   SpO2 95%   BMI 23.26 kg/m   Outpatient Encounter Medications as of 05/06/2018  Medication Sig  . acetaminophen (TYLENOL) 325 MG tablet Take 2 tablets (650 mg total) by mouth every 6 (six) hours as needed for mild pain (or Fever >/= 101).  Marland Kitchen amLODipine (NORVASC) 10 MG tablet Take 1 tablet (10 mg total)  by mouth daily.  . clopidogrel (PLAVIX) 75 MG tablet Take 1 tablet (75 mg total) by mouth daily.  Marland Kitchen donepezil (ARICEPT) 10 MG tablet Take 10 mg by mouth at bedtime.  Marland Kitchen escitalopram (LEXAPRO) 5 MG tablet Take 5 mg by mouth daily.  Marland Kitchen glipiZIDE (GLUCOTROL) 5 MG tablet Take 1 tablet (5 mg total) by mouth 2 (two) times daily before a meal.  . hydrALAZINE (APRESOLINE) 25 MG tablet Take 1 tablet (25 mg total) by mouth every 8 (eight) hours.  . metFORMIN (GLUCOPHAGE) 1000 MG tablet Take 1,000 mg by mouth 2 (two) times daily with a meal.  . metoprolol tartrate (LOPRESSOR) 25 MG tablet Take 1 tablet (25 mg total) by mouth 2 (two) times daily.  . NON FORMULARY Diet type: Carb Diet  . simvastatin (ZOCOR) 20 MG tablet Take 20 mg by mouth daily.   No facility-administered encounter medications on file as of 05/06/2018.      SIGNIFICANT DIAGNOSTIC EXAMS  TODAY:   04-29-18: chest x-ray: No acute cardiopulmonary process. Cardiomegaly.  04-29-18: ct of head: 1. No acute intracranial  pathology. 2. Age-related atrophy and chronic microvascular ischemic changes.  04-30-18: ct of head: Atrophy and small vessel disease similar to priors. No acuteintracranial findings. No change from yesterday's exam. Calcific cerebrovascular atherosclerosis.  LABS REVIEWED TODAY:   04-11-18: chol 189; ldl 116; trig 186; hdl 36 04-29-18: wbc 9.0; hgb 15.6; hct 44.6; mcv 88.7; plt 303; glucose 148; bun 17; creat 1.40; k+ 4.2; na++ 140; ca 9.1; liver normal albumin 3.9 05-01-18: wbc 5.3; hgb 13.7; hct 38.5; mcv 88.9; plt 243; glucose 131; bun 15; creat 1.45; k+ 3.6; na++ 141; ca 8.4    Review of Systems  Constitutional: Negative for malaise/fatigue.  Respiratory: Negative for cough and shortness of breath.   Cardiovascular: Negative for chest pain and leg swelling.  Gastrointestinal: Negative for abdominal pain, constipation and heartburn.  Musculoskeletal: Negative for back pain, joint pain and myalgias.  Skin: Negative.   Neurological: Negative for dizziness.  Psychiatric/Behavioral: The patient is not nervous/anxious.     Physical Exam  Constitutional: He appears well-developed and well-nourished. No distress.  Neck: No thyromegaly present.  Cardiovascular: Normal rate, regular rhythm and intact distal pulses.  Murmur heard. 1/6  Pulmonary/Chest: Effort normal and breath sounds normal. No respiratory distress.  Abdominal: Soft. Bowel sounds are normal. He exhibits no distension. There is no tenderness.  Musculoskeletal: He exhibits no edema.  Is able to move all extremities   Lymphadenopathy:    He has no cervical adenopathy.  Neurological: He is alert.  Skin: Skin is warm and dry. He is not diaphoretic.  Psychiatric: He has a normal mood and affect.    ASSESSMENT/ PLAN:  TODAY:   1.  TIA: is neurologically stable: will continue plavix 75 mg daily   2. Hypertension associated with diabetes: is stable b/p 104/72: will continue norvasc 10 mg daily lopressor 25 mg twice daily  and apresoline 25 mg three times daily   3. Stage 3 chronic renal impairment associated with type 2 diabetes mellitus: is stable bun 15; creat 1.45  4. Dyslipidemia associated with type 2 diabetes mellitus: is stable ldl 116; will stop zocor due to this medication causing lower extremity weakness per daughter.   5. Controlled type 2 diabetes mellitus with stage 3 chronic kidney disease without long term current use of insulin: is stable will continue metformin 1 gm twice daily and glipizide 5 mg twice daily   6. Vascular dementia without behavioral  disturbance: is without change weight is 153 pounds; will continue aricept 10 mg daily   7. Depression due to dementia: is emotionally stable; will continue lexapro 5 mg daily   8. GERD without esophagitis: is stable will continue prilosec 40 mg daily     MD is aware of resident's narcotic use and is in agreement with current plan of care. We will attempt to wean resident as apropriate   Ok Edwards NP [facility]  Contact [phone] Monday through Friday 8am- 5pm  After hours call [phone]

## 2018-05-12 ENCOUNTER — Non-Acute Institutional Stay (SKILLED_NURSING_FACILITY): Payer: Medicare Other | Admitting: Adult Health

## 2018-05-12 DIAGNOSIS — E0822 Diabetes mellitus due to underlying condition with diabetic chronic kidney disease: Secondary | ICD-10-CM | POA: Diagnosis not present

## 2018-05-12 DIAGNOSIS — N183 Chronic kidney disease, stage 3 (moderate): Secondary | ICD-10-CM | POA: Diagnosis not present

## 2018-05-12 DIAGNOSIS — I160 Hypertensive urgency: Secondary | ICD-10-CM | POA: Diagnosis not present

## 2018-05-12 NOTE — Progress Notes (Signed)
Location:  [facility] Number: [id number] Place of Service:  SNF (31) Provider:  Durenda Age, NP  Patient Care Team: Albina Billet, MD as PCP - General (Internal Medicine)  Extended Emergency Contact Information Primary Emergency Contact: Belinda, [place] Mobile Phone: [phone] Relation: Spouse Secondary Emergency Contact: Marilynne Halsted Mobile Phone: [phone] Relation: Daughter  Code Status:  DNR  Goals of care: Advanced Directive information Advanced Directives 05/13/2018  Does Patient Have a Medical Advance Directive? Yes  Type of Advance Directive Out of facility DNR (pink MOST or yellow form)  Does patient want to make changes to medical advance directive? No - Patient declined  Copy of [place] in Chart? -  Would patient like information on creating a medical advance directive? No - Patient declined  Pre-existing out of facility DNR order (yellow form or pink MOST form) Yellow form placed in chart (order not valid for inpatient use)     Chief Complaint  Patient presents with  . Acute Visit    Patient is seen for blood pressure management.      HPI:  Pt is a 84 y.o. male seen today for elevated BPs -135/75, 175/75, 176/68, 154/74. No complaints of headache. He denies any pain.  He has been admitted to [facility] on 04/06/18 from a recent hospitalization for hypertensive urgency. He is currently having a short-term rehabilitation.   Past Medical History:  Diagnosis Date  . Dementia ([place])   . Diabetes mellitus without complication ([place])   . Hypertension    Past Surgical History:  Procedure Laterality Date  . none      No Known Allergies  Outpatient Encounter Medications as of 05/12/2018  Medication Sig  . acetaminophen (TYLENOL) 325 MG tablet Take 2 tablets (650 mg total) by mouth every 6 (six) hours as needed for mild pain (or Fever >/= 101).  Marland Kitchen amLODipine (NORVASC) 10 MG tablet  Take 1 tablet (10 mg total) by mouth daily.  . clopidogrel (PLAVIX) 75 MG tablet Take 1 tablet (75 mg total) by mouth daily.  Marland Kitchen donepezil (ARICEPT) 10 MG tablet Take 10 mg by mouth at bedtime.  Marland Kitchen escitalopram (LEXAPRO) 5 MG tablet Take 5 mg by mouth daily.  Marland Kitchen glipiZIDE (GLUCOTROL) 5 MG tablet Take 1 tablet (5 mg total) by mouth 2 (two) times daily before a meal.  . metFORMIN (GLUCOPHAGE) 1000 MG tablet Take 1,000 mg by mouth 2 (two) times daily with a meal.  . metoprolol tartrate (LOPRESSOR) 25 MG tablet Take 1 tablet (25 mg total) by mouth 2 (two) times daily.  . NON FORMULARY Diet type: Carb Diet  . [DISCONTINUED] hydrALAZINE (APRESOLINE) 25 MG tablet Take 1 tablet (25 mg total) by mouth every 8 (eight) hours. (Patient not taking: Reported on 05/13/2018)  . [DISCONTINUED] simvastatin (ZOCOR) 20 MG tablet Take 20 mg by mouth daily.   No facility-administered encounter medications on file as of 05/12/2018.     Review of Systems  GENERAL: No change in appetite, no fatigue, no weight changes, no fever, chills or weakness MOUTH and THROAT: Denies oral discomfort, gingival pain or bleeding RESPIRATORY: no cough, SOB, DOE, wheezing, hemoptysis CARDIAC: No chest pain, edema or palpitations GI: No abdominal pain, diarrhea, constipation, heart burn, nausea or vomiting GU: Denies dysuria, frequency, hematuria, incontinence, or discharge PSYCHIATRIC: Denies feelings of depression or anxiety. No report of hallucinations, insomnia, paranoia, or agitation   Pertinent  Health Maintenance Due  Topic Date Due  .  INFLUENZA VACCINE  06/07/2018 (Originally 03/04/2018)  . FOOT EXAM  05/09/2019 (Originally 01/19/1944)  . HEMOGLOBIN A1C  05/09/2019 (Originally October 02, 1933)  . OPHTHALMOLOGY EXAM  05/09/2019 (Originally 01/19/1944)  . PNA vac Low Risk Adult (1 of 2 - PCV13) 05/09/2019 (Originally 01/19/2001)      Physical Exam  GENERAL APPEARANCE: Well nourished. In no acute distress.  SKIN:  Skin is warm and  dry.  MOUTH and THROAT: Lips are without lesions. Oral mucosa is moist and without lesions.  RESPIRATORY: Breathing is even & unlabored, BS CTAB CARDIAC: RRR, no murmur,no extra heart sounds, no edema GI: Abdomen soft, normal BS, no masses, no tenderness EXTREMITIES:  Able to move X 4 extremities NEUROLOGICAL: There is no tremor. Speech is clear. Alert and oriented X 3.  PSYCHIATRIC: Affect and behavior are appropriate  Labs reviewed: Recent Labs    04/29/18 2113 04/30/18 1730 05/01/18 0905  NA 140 140 141  K 4.2 4.3 3.6  CL 102 103 103  CO2 27 28 28   GLUCOSE 148* 95 131*  BUN 17 17 15   CREATININE 1.40* 1.63* 1.45*  CALCIUM 9.1 8.8* 8.4*   Recent Labs    04/10/18 1537 04/29/18 2113 04/30/18 1730  AST 27 28 22   ALT 15 15 12   ALKPHOS 77 68 68  BILITOT 0.5 0.7 0.4  PROT 7.1 7.0 6.5  ALBUMIN 4.1 3.9 3.6   Recent Labs    04/10/18 1537 04/29/18 2113 04/30/18 1730 05/01/18 0905  WBC 9.3 9.0 7.1 5.3  NEUTROABS 5.9  --  3.6  --   HGB 15.3 15.6 14.4 13.7  HCT 44.5 44.6 41.3 38.5*  MCV 88.1 88.7 88.8 88.9  PLT 278 303 285 243    Lab Results  Component Value Date   CHOL 189 04/11/2018   HDL 36 (L) 04/11/2018   LDLCALC 116 (H) 04/11/2018   TRIG 186 (H) 04/11/2018   CHOLHDL 5.3 04/11/2018    Significant Diagnostic Results in last 30 days:  No results found.  Assessment/Plan  1. Hypertensive urgency - BPs are elevated, will increase hydralazine from 25 mg every 8 hours to 25 mg 1 tab every 6 hours, BP/HR every 6 hours hold for systolic BP less than 413 and diastolic BP less than 60, continue amlodipine 10 mg 1 tab daily and metoprolol 25 mg 1 tab twice a day   2. Diabetes mellitus due to underlying condition with stage 3 chronic kidney disease, without long-term current use of insulin (HCC) -continue metformin 1000 mg 1 tab twice a day and CBG twice daily    Family/ staff Communication: Discussed plan of care with resident.  Labs/tests ordered: None  Goals  of care:   Short-term rehabilitation.   Durenda Age, NP [facility] [phone] (Monday-Friday 8:00 a.m. - 5:00 p.m.) [phone] (after hours)

## 2018-05-13 ENCOUNTER — Encounter: Payer: Self-pay | Admitting: Adult Health

## 2018-05-13 ENCOUNTER — Non-Acute Institutional Stay (SKILLED_NURSING_FACILITY): Payer: Medicare Other | Admitting: Adult Health

## 2018-05-13 DIAGNOSIS — I10 Essential (primary) hypertension: Secondary | ICD-10-CM

## 2018-05-13 DIAGNOSIS — I160 Hypertensive urgency: Secondary | ICD-10-CM | POA: Diagnosis not present

## 2018-05-13 DIAGNOSIS — E1159 Type 2 diabetes mellitus with other circulatory complications: Secondary | ICD-10-CM | POA: Diagnosis not present

## 2018-05-13 DIAGNOSIS — G459 Transient cerebral ischemic attack, unspecified: Secondary | ICD-10-CM | POA: Diagnosis not present

## 2018-05-13 DIAGNOSIS — I152 Hypertension secondary to endocrine disorders: Secondary | ICD-10-CM

## 2018-05-13 NOTE — Progress Notes (Signed)
[facility]   [facility] Room Number: [place] of Service:  SNF (31)    CODE STATUS: DNR  No Known Allergies  Chief Complaint  Patient presents with  . Discharge Note    Discharging to home    HPI:  He is being discharged to home with home health for pt/ot. He will need a BSC. He will need his prescriptions written and will need to follow up with his medical provider. He had been hospitalized for uncontrolled hypertension. He has participated in therapy and is now ready for discharge to home.    Past Medical History:  Diagnosis Date  . Dementia ([place])   . Diabetes mellitus without complication ([place])   . Hypertension     Past Surgical History:  Procedure Laterality Date  . none      Social History   Socioeconomic History  . Marital status: Married    Spouse name: Not on file  . Number of children: Not on file  . Years of education: Not on file  . Highest education level: Not on file  Occupational History  . Occupation: retired  Scientific laboratory technician  . Financial resource strain: Not on file  . Food insecurity:    Worry: Not on file    Inability: Not on file  . Transportation needs:    Medical: Not on file    Non-medical: Not on file  Tobacco Use  . Smoking status: Never Smoker  . Smokeless tobacco: Never Used  Substance and Sexual Activity  . Alcohol use: Never    Frequency: Never  . Drug use: Never  . Sexual activity: Not on file  Lifestyle  . Physical activity:    Days per week: Not on file    Minutes per session: Not on file  . Stress: Not on file  Relationships  . Social connections:    Talks on phone: Not on file    Gets together: Not on file    Attends religious service: Not on file    Active member of club or organization: Not on file    Attends meetings of clubs or organizations: Not on file    Relationship status: Not on file  . Intimate partner violence:    Fear of current or ex partner: Not on file    Emotionally  abused: Not on file    Physically abused: Not on file    Forced sexual activity: Not on file  Other Topics Concern  . Not on file  Social History Narrative  . Not on file   Family History  Problem Relation Age of Onset  . Heart disease Father     VITAL SIGNS BP (!) 170/74   Pulse 87   Temp (!) 97.5 F (36.4 C)   Resp (!) 22   Ht 5\' 8"  (1.727 m)   Wt 153 lb (69.4 kg)   SpO2 98%   BMI 23.26 kg/m   Patient's Medications  New Prescriptions   No medications on file  Previous Medications   ACETAMINOPHEN (TYLENOL) 325 MG TABLET    Take 2 tablets (650 mg total) by mouth every 6 (six) hours as needed for mild pain (or Fever >/= 101).   AMLODIPINE (NORVASC) 10 MG TABLET    Take 1 tablet (10 mg total) by mouth daily.   CLOPIDOGREL (PLAVIX) 75 MG TABLET    Take 1 tablet (75 mg total) by mouth daily.   DONEPEZIL (ARICEPT) 10 MG TABLET  Take 10 mg by mouth at bedtime.   ESCITALOPRAM (LEXAPRO) 5 MG TABLET    Take 5 mg by mouth daily.   GLIPIZIDE (GLUCOTROL) 5 MG TABLET    Take 1 tablet (5 mg total) by mouth 2 (two) times daily before a meal.   HYDRALAZINE (APRESOLINE) 25 MG TABLET    Take 25 mg by mouth 2 (two) times daily.   METFORMIN (GLUCOPHAGE) 1000 MG TABLET    Take 1,000 mg by mouth 2 (two) times daily with a meal.   METOPROLOL TARTRATE (LOPRESSOR) 25 MG TABLET    Take 1 tablet (25 mg total) by mouth 2 (two) times daily.   NON FORMULARY    Diet type: Carb Diet   OMEPRAZOLE (PRILOSEC) 40 MG CAPSULE    Take 40 mg by mouth daily.  Modified Medications   No medications on file  Discontinued Medications   HYDRALAZINE (APRESOLINE) 25 MG TABLET    Take 1 tablet (25 mg total) by mouth every 8 (eight) hours.   SIMVASTATIN (ZOCOR) 20 MG TABLET    Take 20 mg by mouth daily.     SIGNIFICANT DIAGNOSTIC EXAMS  PREVIOUS:   04-29-18: chest x-ray: No acute cardiopulmonary process. Cardiomegaly.  04-29-18: ct of head: 1. No acute intracranial pathology. 2. Age-related atrophy and chronic  microvascular ischemic changes.  04-30-18: ct of head: Atrophy and small vessel disease similar to priors. No acuteintracranial findings. No change from yesterday's exam. Calcific cerebrovascular atherosclerosis.  NO NEW EXAMS.   LABS REVIEWED PREVIOUS:   04-11-18: chol 189; ldl 116; trig 186; hdl 36 04-29-18: wbc 9.0; hgb 15.6; hct 44.6; mcv 88.7; plt 303; glucose 148; bun 17; creat 1.40; k+ 4.2; na++ 140; ca 9.1; liver normal albumin 3.9 05-01-18: wbc 5.3; hgb 13.7; hct 38.5; mcv 88.9; plt 243; glucose 131; bun 15; creat 1.45; k+ 3.6; na++ 141; ca 8.4   NO NEW LABS.    Review of Systems  Constitutional: Negative for malaise/fatigue.  Respiratory: Negative for cough and shortness of breath.   Cardiovascular: Negative for chest pain, palpitations and leg swelling.  Gastrointestinal: Negative for abdominal pain, constipation and heartburn.  Musculoskeletal: Negative for back pain, joint pain and myalgias.  Skin: Negative.   Neurological: Negative for dizziness.  Psychiatric/Behavioral: The patient is not nervous/anxious.    Physical Exam  Constitutional: He is oriented to person, place, and time. He appears well-developed and well-nourished. No distress.  Neck: No thyromegaly present.  Cardiovascular: Normal rate, regular rhythm and intact distal pulses.  Murmur heard. 1/6  Pulmonary/Chest: Effort normal and breath sounds normal. No respiratory distress.  Abdominal: Soft. Bowel sounds are normal. He exhibits no distension. There is no tenderness.  Musculoskeletal: He exhibits no edema.  Is able to move all extremities   Lymphadenopathy:    He has no cervical adenopathy.  Neurological: He is alert and oriented to person, place, and time.  Skin: Skin is warm and dry. He is not diaphoretic.  Psychiatric: He has a normal mood and affect.    ASSESSMENT/ PLAN:  Patient is being discharged with the following home health services:  Pt/ot to evaluate and treat as indicated for gait  balance strength adl training.   Patient is being discharged with the following durable medical equipment:  [facility]  Patient has been advised to f/u with their PCP in 1-2 weeks to bring them up to date on their rehab stay.  Social services at facility was responsible for arranging this appointment.  Pt was provided with a  30 day supply of prescriptions for medications and refills must be obtained from their PCP.  For controlled substances, a more limited supply may be provided adequate until PCP appointment only.  A 30 day supply of his prescription medication have been written as listed above.   Time spent with patient 35 minutes; discussed medications; home health needs and expectations. Verbalized understanding.   Ok Edwards NP [facility]  Contact [phone] Monday through Friday 8am- 5pm  After hours call [phone]

## 2018-06-23 ENCOUNTER — Ambulatory Visit: Payer: 59 | Admitting: Podiatry

## 2018-07-08 ENCOUNTER — Other Ambulatory Visit: Payer: Self-pay | Admitting: Internal Medicine

## 2018-07-08 DIAGNOSIS — R109 Unspecified abdominal pain: Secondary | ICD-10-CM

## 2018-07-08 DIAGNOSIS — R112 Nausea with vomiting, unspecified: Secondary | ICD-10-CM

## 2018-07-12 ENCOUNTER — Ambulatory Visit: Payer: Medicare Other

## 2018-07-12 ENCOUNTER — Ambulatory Visit
Admission: RE | Admit: 2018-07-12 | Discharge: 2018-07-12 | Disposition: A | Discharge status: Home / Self Care | Payer: Medicare Other | Source: Ambulatory Visit | Attending: Internal Medicine | Admitting: Internal Medicine

## 2018-07-12 DIAGNOSIS — R109 Unspecified abdominal pain: Secondary | ICD-10-CM | POA: Insufficient documentation

## 2018-07-12 DIAGNOSIS — R112 Nausea with vomiting, unspecified: Secondary | ICD-10-CM | POA: Diagnosis not present

## 2018-07-12 DIAGNOSIS — K862 Cyst of pancreas: Secondary | ICD-10-CM | POA: Insufficient documentation

## 2018-07-12 DIAGNOSIS — N281 Cyst of kidney, acquired: Secondary | ICD-10-CM | POA: Insufficient documentation

## 2018-11-25 ENCOUNTER — Encounter: Payer: Self-pay | Admitting: Emergency Medicine

## 2018-11-25 ENCOUNTER — Other Ambulatory Visit: Payer: Self-pay

## 2018-11-25 ENCOUNTER — Emergency Department
Admission: EM | Admit: 2018-11-25 | Discharge: 2018-11-25 | Disposition: A | Discharge status: Home / Self Care | Payer: Medicare Other | Attending: Student in an Organized Health Care Education/Training Program | Admitting: Student in an Organized Health Care Education/Training Program

## 2018-11-25 ENCOUNTER — Emergency Department: Payer: Medicare Other

## 2018-11-25 DIAGNOSIS — E119 Type 2 diabetes mellitus without complications: Secondary | ICD-10-CM | POA: Diagnosis not present

## 2018-11-25 DIAGNOSIS — Z79899 Other long term (current) drug therapy: Secondary | ICD-10-CM | POA: Diagnosis not present

## 2018-11-25 DIAGNOSIS — I10 Essential (primary) hypertension: Secondary | ICD-10-CM | POA: Diagnosis not present

## 2018-11-25 DIAGNOSIS — R0602 Shortness of breath: Secondary | ICD-10-CM | POA: Insufficient documentation

## 2018-11-25 LAB — CBC WITH DIFFERENTIAL/PLATELET
Abs Immature Granulocytes: 0.07 10*3/uL (ref 0.00–0.07)
Basophils Absolute: 0 10*3/uL (ref 0.0–0.1)
Basophils Relative: 1 %
Eosinophils Absolute: 0.3 10*3/uL (ref 0.0–0.5)
Eosinophils Relative: 4 %
HCT: 40.6 % (ref 39.0–52.0)
Hemoglobin: 13.9 g/dL (ref 13.0–17.0)
Immature Granulocytes: 1 %
Lymphocytes Relative: 25 %
Lymphs Abs: 1.7 10*3/uL (ref 0.7–4.0)
MCH: 30.9 pg (ref 26.0–34.0)
MCHC: 34.2 g/dL (ref 30.0–36.0)
MCV: 90.2 fL (ref 80.0–100.0)
Monocytes Absolute: 0.7 10*3/uL (ref 0.1–1.0)
Monocytes Relative: 10 %
Neutro Abs: 4.2 10*3/uL (ref 1.7–7.7)
Neutrophils Relative %: 59 %
Platelets: 301 10*3/uL (ref 150–400)
RBC: 4.5 MIL/uL (ref 4.22–5.81)
RDW: 12.3 % (ref 11.5–15.5)
WBC: 7 10*3/uL (ref 4.0–10.5)
nRBC: 0 % (ref 0.0–0.2)

## 2018-11-25 LAB — COMPREHENSIVE METABOLIC PANEL
ALT: 12 U/L (ref 0–44)
AST: 14 U/L — ABNORMAL LOW (ref 15–41)
Albumin: 3.7 g/dL (ref 3.5–5.0)
Alkaline Phosphatase: 81 U/L (ref 38–126)
Anion gap: 7 (ref 5–15)
BUN: 22 mg/dL (ref 8–23)
CO2: 29 mmol/L (ref 22–32)
Calcium: 8.9 mg/dL (ref 8.9–10.3)
Chloride: 105 mmol/L (ref 98–111)
Creatinine, Ser: 1.41 mg/dL — ABNORMAL HIGH (ref 0.61–1.24)
GFR calc Af Amer: 53 mL/min — ABNORMAL LOW (ref >60)
GFR calc non Af Amer: 46 mL/min — ABNORMAL LOW (ref >60)
Glucose, Bld: 144 mg/dL — ABNORMAL HIGH (ref 70–99)
Potassium: 3.7 mmol/L (ref 3.5–5.1)
Sodium: 141 mmol/L (ref 135–145)
Total Bilirubin: 0.7 mg/dL (ref 0.3–1.2)
Total Protein: 7.2 g/dL (ref 6.5–8.1)

## 2018-11-25 LAB — TROPONIN I: Troponin I: <0.03 ng/mL (ref <0.03)

## 2018-11-25 NOTE — ED Notes (Signed)
As per faughter patient at [facility] clinic sent to ed for further eval.

## 2018-11-25 NOTE — ED Provider Notes (Signed)
[facility] Emergency Department Provider Note    First MD Initiated Contact with Patient 11/25/18 1607     (approximate)  I have reviewed the triage vital signs and the nursing notes.   HISTORY  Chief Complaint EKG Changes   HPI Ian Jimenez is a 85 y.o. male with the below listed past medical history presents the ER from [place] clinic for evaluation of abnormal EKG.  Patient initially presented to [facility] clinic from [facility] after having elevated blood pressure and some shortness of breath that occurred late this morning.  Patient reportedly slept and did not take his morning antihypertensive medications.  Felt some generalized weakness and shakiness for a few moments with some shortness of breath.  He was given his antihypertensive medications with improvement.  He was directed to the [place] clinic for evaluation.  He is currently asymptomatic and is accompanied by his daughter.  He denies any symptoms.  No discomfort.  No recent fevers.   Past Medical History:  Diagnosis Date  . Dementia ([place])   . Diabetes mellitus without complication ([place])   . Hypertension    Family History  Problem Relation Age of Onset  . Heart disease Father    Past Surgical History:  Procedure Laterality Date  . none     Patient Active Problem List   Diagnosis Date Noted  . Hypertension associated with diabetes ([employer]) 05/06/2018  . Stage 3 chronic renal impairment associated with type 2 diabetes mellitus ([place]) 05/06/2018  . Dyslipidemia associated with type 2 diabetes mellitus ([place]) 05/06/2018  . Controlled type 2 diabetes mellitus with stage 3 chronic kidney disease ([place]) 05/06/2018  . Vascular dementia without behavioral disturbance ([place]) 05/06/2018  . Depression due to dementia ([place]) 05/06/2018  . GERD without esophagitis 05/06/2018  . Hypertensive urgency 04/30/2018  . TIA (transient ischemic attack) 04/10/2018      Prior to Admission medications    Medication Sig Start Date End Date Taking? Authorizing Provider  acetaminophen (TYLENOL) 325 MG tablet Take 2 tablets (650 mg total) by mouth every 6 (six) hours as needed for mild pain (or Fever >/= 101). 05/03/18   Salary, Avel Peace, MD  amLODipine (NORVASC) 10 MG tablet Take 1 tablet (10 mg total) by mouth daily. 05/04/18   Salary, Avel Peace, MD  donepezil (ARICEPT) 10 MG tablet Take 10 mg by mouth at bedtime.    [provider]  escitalopram (LEXAPRO) 5 MG tablet Take 5 mg by mouth daily. 03/12/18   [provider]  glipiZIDE (GLUCOTROL) 5 MG tablet Take 1 tablet (5 mg total) by mouth 2 (two) times daily before a meal. 04/11/18   Gladstone Lighter, MD  hydrALAZINE (APRESOLINE) 25 MG tablet Take 25 mg by mouth 2 (two) times daily. 05/13/18   [provider]  metFORMIN (GLUCOPHAGE) 1000 MG tablet Take 1,000 mg by mouth 2 (two) times daily with a meal.    [provider]  metoprolol tartrate (LOPRESSOR) 25 MG tablet Take 1 tablet (25 mg total) by mouth 2 (two) times daily. 05/03/18   Salary, Avel Peace, MD  NON FORMULARY Diet type: Carb Diet    [provider]  omeprazole (PRILOSEC) 40 MG capsule Take 40 mg by mouth daily. 05/07/18   [provider]    Allergies Patient has no known allergies.    Social History Social History   Tobacco Use  . Smoking status: Never Smoker  . Smokeless tobacco: Never Used  Substance Use Topics  .  Alcohol use: Never    Frequency: Never  . Drug use: Never    Review of Systems Patient denies headaches, rhinorrhea, blurry vision, numbness, shortness of breath, chest pain, edema, cough, abdominal pain, nausea, vomiting, diarrhea, dysuria, fevers, rashes or hallucinations unless otherwise stated above in HPI. ____________________________________________   PHYSICAL EXAM:  VITAL SIGNS: Vitals:   11/25/18 1602  BP: (!) 155/58  Pulse: (!) 58  Resp: 16  Temp: 98.1 F (36.7 C)  SpO2: 97%     Constitutional: Alert, pleasant and cooperative.  Eyes: Conjunctivae are normal.  Head: Atraumatic. Nose: No congestion/rhinnorhea. Mouth/Throat: Mucous membranes are moist.   Neck: No stridor. Painless ROM.  Cardiovascular: Normal rate, regular rhythm. Grossly normal heart sounds.  Good peripheral circulation. Respiratory: Normal respiratory effort.  No retractions. Lungs CTAB. Gastrointestinal: Soft and nontender. No distention. No abdominal bruits. No CVA tenderness. Genitourinary:  Musculoskeletal: No lower extremity tenderness nor edema.  No joint effusions. Neurologic:  Normal speech and language. No gross focal neurologic deficits are appreciated. No facial droop Skin:  Skin is warm, dry and intact. No rash noted. Psychiatric: Mood and affect are normal. Speech and behavior are normal.  ____________________________________________   LABS (all labs ordered are listed, but only abnormal results are displayed)  Results for orders placed or performed during the hospital encounter of 11/25/18 (from the past 24 hour(s))  CBC with Differential/Platelet     Status: None   Collection Time: 11/25/18  4:44 PM  Result Value Ref Range   WBC 7.0 4.0 - 10.5 K/uL   RBC 4.50 4.22 - 5.81 MIL/uL   Hemoglobin 13.9 13.0 - 17.0 g/dL   HCT 40.6 39.0 - 52.0 %   MCV 90.2 80.0 - 100.0 fL   MCH 30.9 26.0 - 34.0 pg   MCHC 34.2 30.0 - 36.0 g/dL   RDW 12.3 11.5 - 15.5 %   Platelets 301 150 - 400 K/uL   nRBC 0.0 0.0 - 0.2 %   Neutrophils Relative % 59 %   Neutro Abs 4.2 1.7 - 7.7 K/uL   Lymphocytes Relative 25 %   Lymphs Abs 1.7 0.7 - 4.0 K/uL   Monocytes Relative 10 %   Monocytes Absolute 0.7 0.1 - 1.0 K/uL   Eosinophils Relative 4 %   Eosinophils Absolute 0.3 0.0 - 0.5 K/uL   Basophils Relative 1 %   Basophils Absolute 0.0 0.0 - 0.1 K/uL   Immature Granulocytes 1 %   Abs Immature Granulocytes 0.07 0.00 - 0.07 K/uL  Comprehensive metabolic panel     Status: Abnormal   Collection Time:  11/25/18  4:44 PM  Result Value Ref Range   Sodium 141 135 - 145 mmol/L   Potassium 3.7 3.5 - 5.1 mmol/L   Chloride 105 98 - 111 mmol/L   CO2 29 22 - 32 mmol/L   Glucose, Bld 144 (H) 70 - 99 mg/dL   BUN 22 8 - 23 mg/dL   Creatinine, Ser 1.41 (H) 0.61 - 1.24 mg/dL   Calcium 8.9 8.9 - 10.3 mg/dL   Total Protein 7.2 6.5 - 8.1 g/dL   Albumin 3.7 3.5 - 5.0 g/dL   AST 14 (L) 15 - 41 U/L   ALT 12 0 - 44 U/L   Alkaline Phosphatase 81 38 - 126 U/L   Total Bilirubin 0.7 0.3 - 1.2 mg/dL   GFR calc non Af Amer 46 (L) >60 mL/min   GFR calc Af Amer 53 (L) >60 mL/min   Anion gap 7 5 -  15  Troponin I - ONCE - STAT     Status: None   Collection Time: 11/25/18  4:44 PM  Result Value Ref Range   Troponin I <0.03 <0.03 ng/mL   ____________________________________________  EKG My review and personal interpretation at Time: 16:01    Indication: weakness  Rate: 60  Rhythm: sinus Axis: normal Other: inferolateral t wave inversion slightly more prominent than previous ekg in 2019. No st changes ____________________________________________  RADIOLOGY  I personally reviewed all radiographic images ordered to evaluate for the above acute complaints and reviewed radiology reports and findings.  These findings were personally discussed with the patient.  Please see medical record for radiology report.  ____________________________________________   PROCEDURES  Procedure(s) performed:  Procedures    Critical Care performed: no ____________________________________________   INITIAL IMPRESSION / ASSESSMENT AND PLAN / ED COURSE  Pertinent labs & imaging results that were available during my care of the patient were reviewed by me and considered in my medical decision making (see chart for details).   DDX: Hypertensive urgency, CHF, ACS, dysrhythmia, electrolyte abnormality  Ian Jimenez is a 85 y.o. who presents to the ED with symptoms as described above.  He is afebrile mildly hypertensive but  in no respiratory distress.  Lung sounds are clear.  On my review the EKG does not show any significant changes as compared to our previous dated 2019.  I have a lower suspicion for myocardial ischemia particular given absence of symptoms however given the presentation with significant hypertension this morning shortness of breath and discomfort will order troponin to evaluate for any evidence of myocardial ischemia.  Clinical Course as of Nov 25 1722  Thu Nov 25, 2018  1723 Patient remains hemodynamically stable.  Remains asymptomatic.  Troponin negative.  Discussed option for further observation in the ER or hospital for serial enzymes the patient is anxious to leave the department and given absence of symptoms with stable appearing EKG and normal enzymes I think that is reasonable.  Might of had some form of hypertensive urgency earlier prior to her taking his home medications but I see no indication for further treatment acutely in the ER.  I think he stable and appropriate for outpatient follow-up.   [PR]    Clinical Course User Index [PR] Merlyn Lot, MD    The patient was evaluated in Emergency Department today for the symptoms described in the history of present illness. He/she was evaluated in the context of the global COVID-19 pandemic, which necessitated consideration that the patient might be at risk for infection with the SARS-CoV-2 virus that causes COVID-19. Institutional protocols and algorithms that pertain to the evaluation of patients at risk for COVID-19 are in a state of rapid change based on information released by regulatory bodies including the CDC and federal and state organizations. These policies and algorithms were followed during the patient's care in the ED.  As part of my medical decision making, I reviewed the following data within the [place] notes reviewed and incorporated, Labs reviewed, notes from prior ED visits and [place] Controlled  Substance Database   ____________________________________________   FINAL CLINICAL IMPRESSION(S) / ED DIAGNOSES  Final diagnoses:  Hypertension, unspecified type      NEW MEDICATIONS STARTED DURING THIS VISIT:  New Prescriptions   No medications on file     Note:  This document was prepared using Dragon voice recognition software and may include unintentional dictation errors.    Merlyn Lot, MD 11/25/18 1725

## 2018-11-25 NOTE — ED Notes (Signed)
MD at bedside with pts daughter to obtain pts hx. Pt in NAD at this time.

## 2018-11-25 NOTE — Discharge Instructions (Addendum)
Please follow-up with PCP as well as cardiology which you have received a referral from here.  Return for any additional questions or concerns.

## 2018-11-25 NOTE — ED Triage Notes (Signed)
Pt in via [place] with daugther.  Pt is from [place], dementia at baseline.  Pt sent over from [facility] due to EKG changes.  Vitals WDL upon arrival.  NAD noted at this time.

## 2018-11-25 NOTE — ED Notes (Signed)
Patient evaluated and discharged to home

## 2019-05-31 IMAGING — CT CT HEAD W/O CM
4 series · 16 of 47 positions shown, 18 images · non-contrast
Comparison: Brain MRI dated 04/10/2018

CLINICAL DATA: 84-year-old male with dementia.

EXAM:
CT HEAD WITHOUT CONTRAST
TECHNIQUE: Contiguous axial images were obtained from the base of the skull
through the vertex without intravenous contrast.

[Series 2: head wo · axial · 0.40mm/px · z∈[-76,+34]mm · 7 of 30 slices shown, 9 images]
[im 4/30  brain]
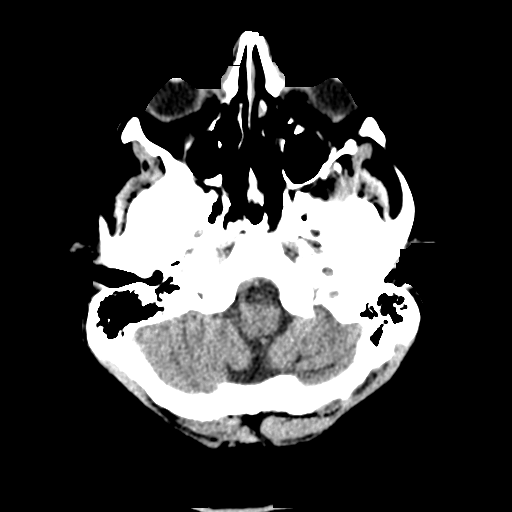
[im 4/30  bone]
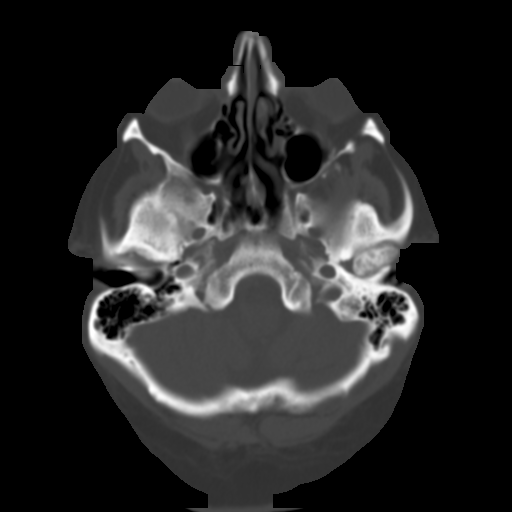
[im 8/30  brain]
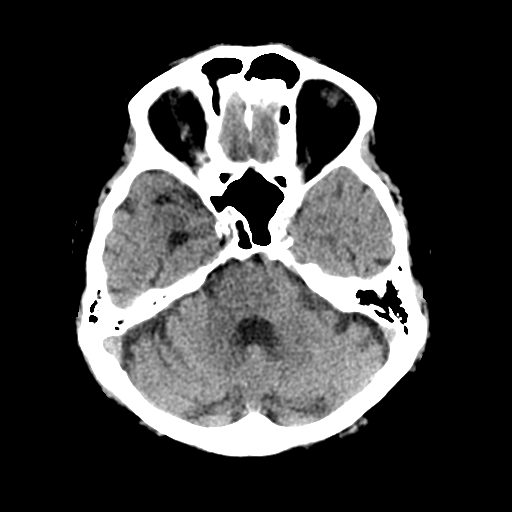
[im 11/30  brain]
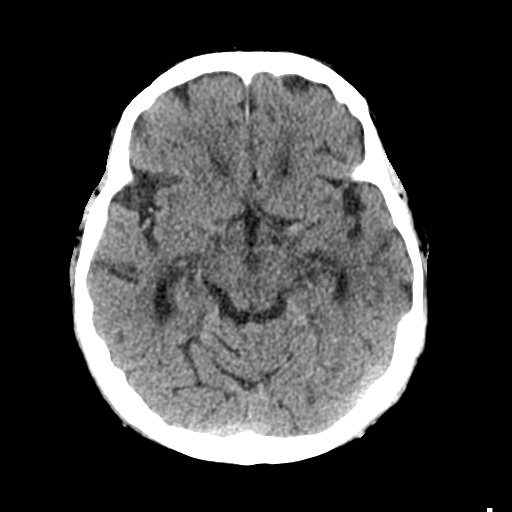
[im 15/30  brain]
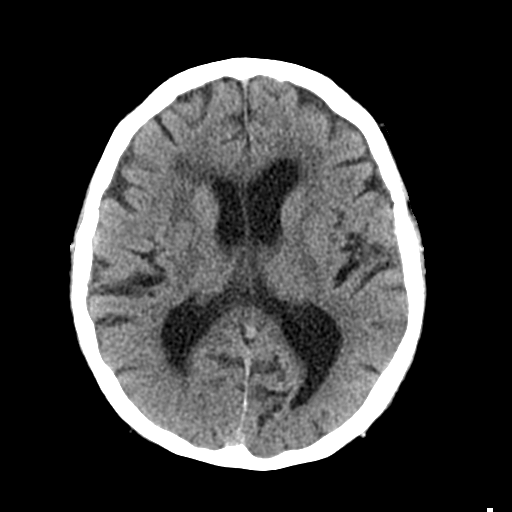
[im 19/30  brain]
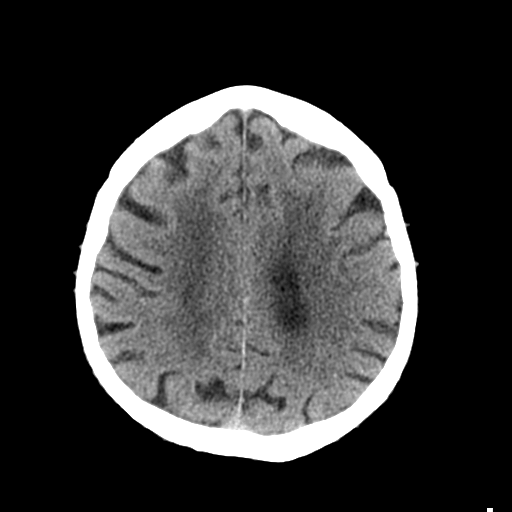
[im 19/30  bone]
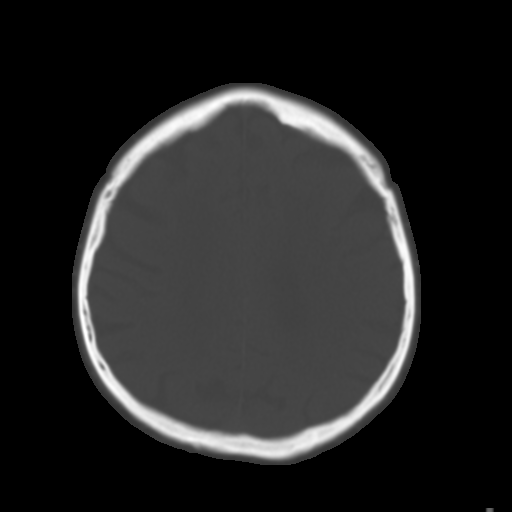
[im 22/30  brain]
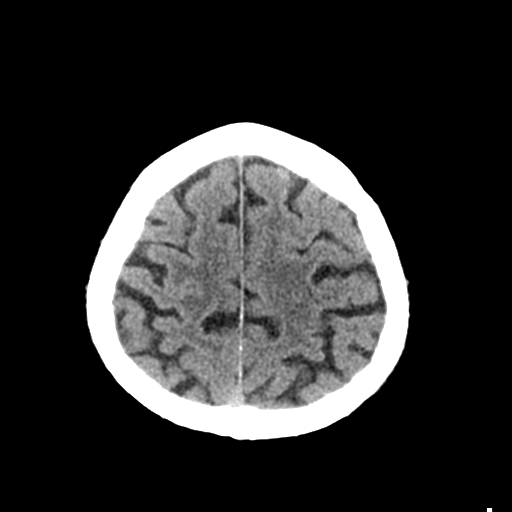
[im 26/30  brain]
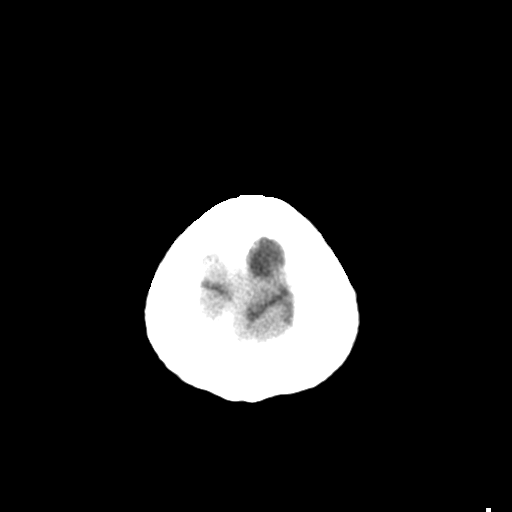

[Series 3: head bone · axial · 0.40mm/px · z∈[-77,-49]mm · 3 of 74 slices shown]
[im 8/74  bone]
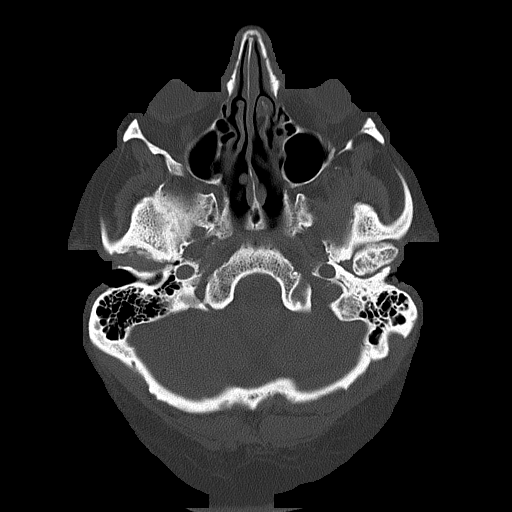
[im 15/74  bone]
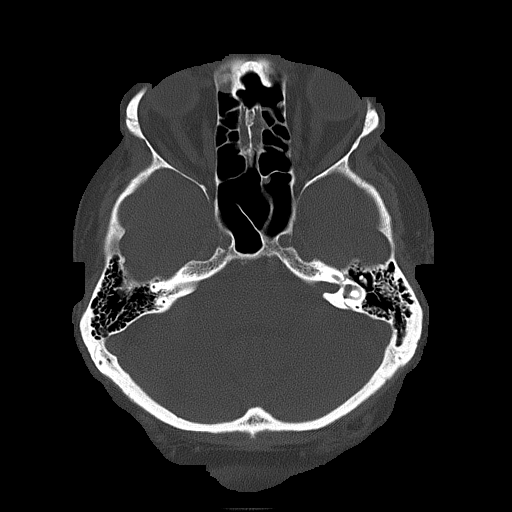
[im 22/74  bone]
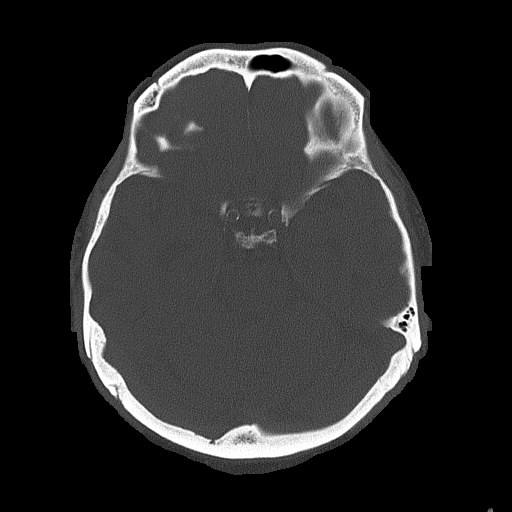

[Series 4: coronal soft tissue · coronal · 0.28mm/px · 3 of 60 slices shown]
[im 20/60  brain]
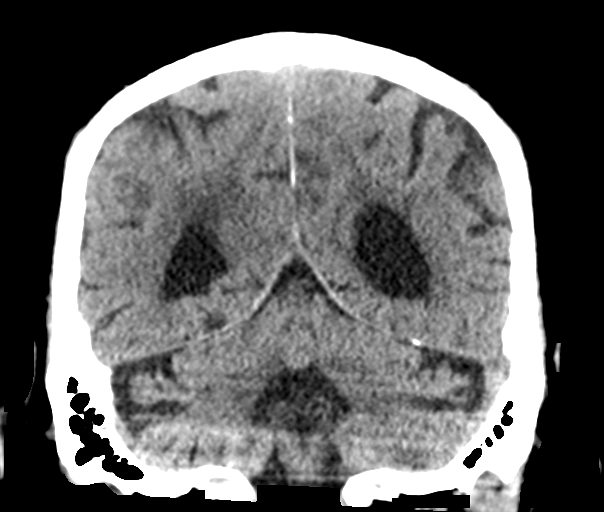
[im 27/60  brain]
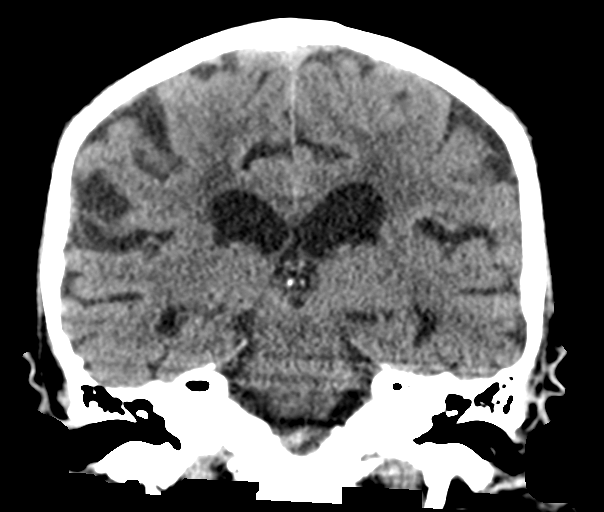
[im 33/60  brain]
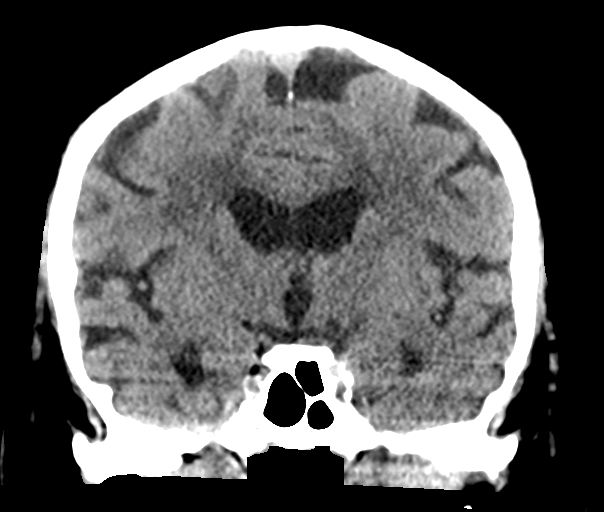

[Series 5: sagittal soft tissue · sagittal · 0.29mm/px · 3 of 53 slices shown]
[im 18/53  brain]
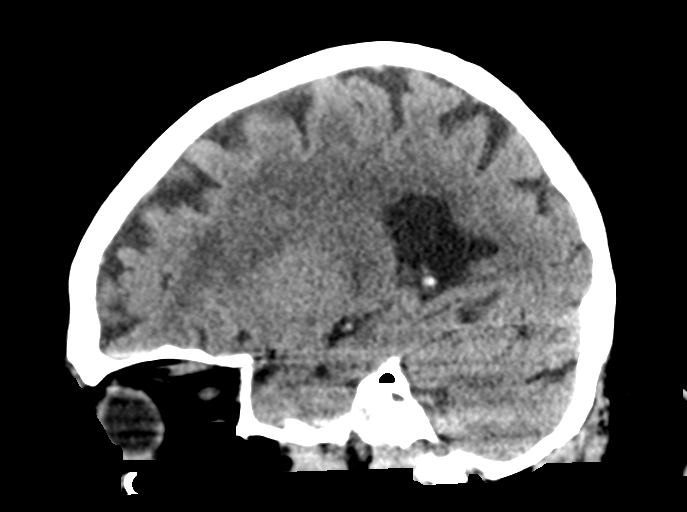
[im 27/53  brain]
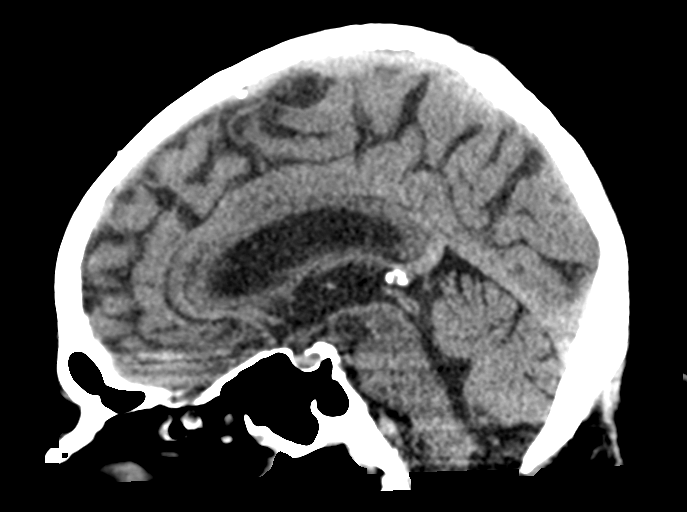
[im 35/53  brain]
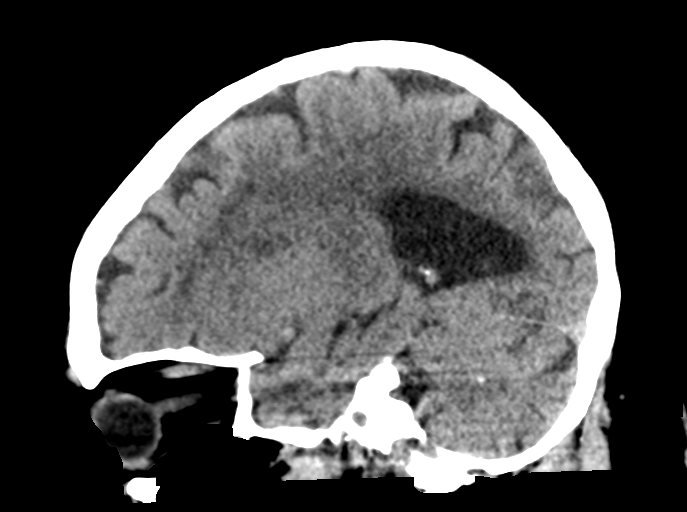

[16 of 47 positions shown; findings below may reference images not displayed]

FINDINGS: Brain: Mild age-related atrophy and moderate chronic microvascular
ischemic changes. There is no acute intracranial hemorrhage. No mass
effect or midline shift. No extra-axial fluid collection.

Vascular: No hyperdense vessel or unexpected calcification.

Skull: Normal. Negative for fracture or focal lesion.

Sinuses/Orbits: Diffuse mucoperiosteal thickening of paranasal
sinuses. The mastoid air cells are clear.

Other: None
IMPRESSION: 1. No acute intracranial pathology.
2. Age-related atrophy and chronic microvascular ischemic changes.

## 2019-05-31 IMAGING — DX DG CHEST 1V PORT
1 series · 1 of 1 positions shown · non-contrast
Comparison: None.

CLINICAL DATA: 84-year-old male with history of diabetes and
hypertension presenting with cough and vomiting.

EXAM:
PORTABLE CHEST 1 VIEW

[chest ap]
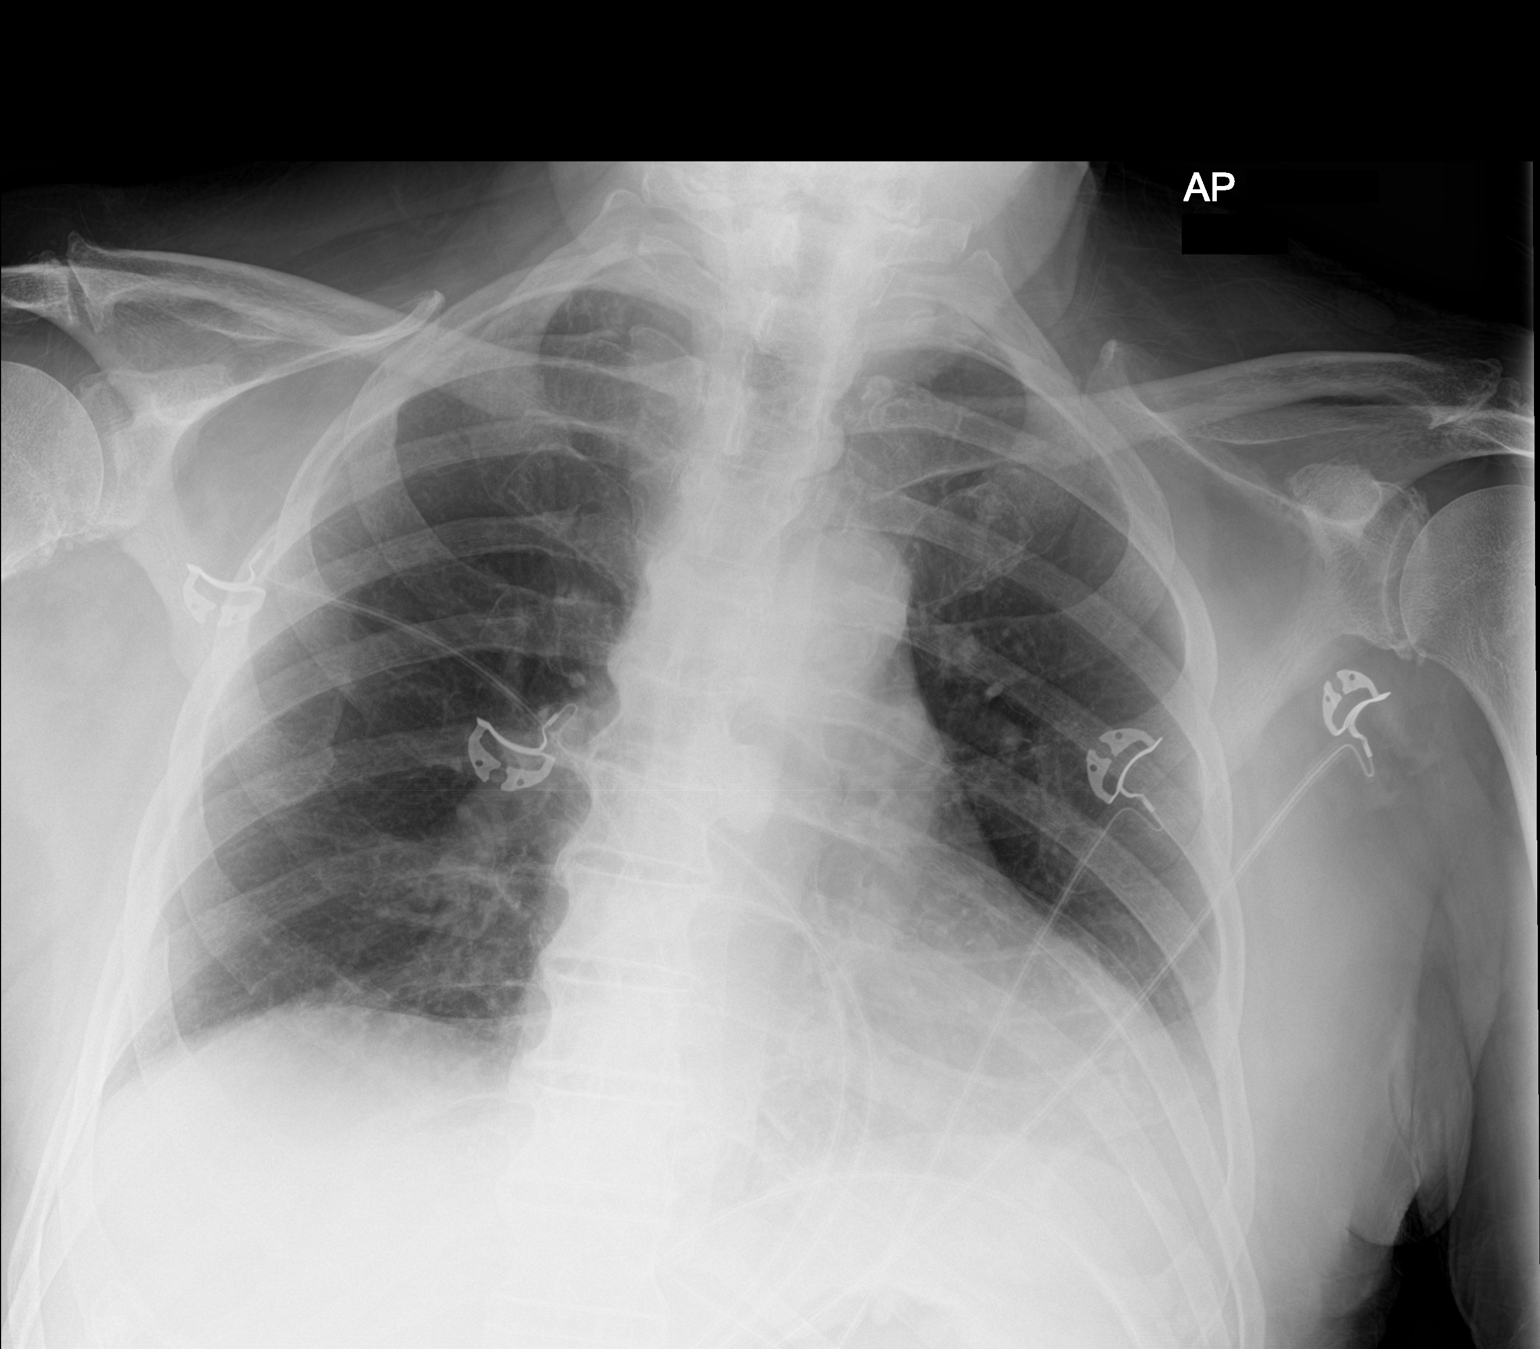

[1 of 1 positions shown; findings below may reference images not displayed]

FINDINGS: No focal consolidation, pleural effusion, or pneumothorax. Mild
cardiomegaly. No acute osseous pathology.
IMPRESSION: No acute cardiopulmonary process.

Cardiomegaly.

## 2020-05-28 ENCOUNTER — Encounter
Admission: RE | Admit: 2020-05-28 | Discharge: 2020-05-28 | Disposition: A | Discharge status: Home / Self Care | Payer: Medicare Other | Source: Ambulatory Visit | Attending: Internal Medicine | Admitting: Internal Medicine

## 2020-06-14 ENCOUNTER — Other Ambulatory Visit: Payer: Self-pay

## 2020-06-14 ENCOUNTER — Emergency Department: Payer: Medicare Other

## 2020-06-14 ENCOUNTER — Emergency Department
Admission: EM | Admit: 2020-06-14 | Discharge: 2020-06-14 | Disposition: A | Discharge status: Home / Self Care | Payer: Medicare Other | Attending: Emergency Medicine | Admitting: Emergency Medicine

## 2020-06-14 DIAGNOSIS — Z7984 Long term (current) use of oral hypoglycemic drugs: Secondary | ICD-10-CM | POA: Insufficient documentation

## 2020-06-14 DIAGNOSIS — Z8673 Personal history of transient ischemic attack (TIA), and cerebral infarction without residual deficits: Secondary | ICD-10-CM | POA: Diagnosis not present

## 2020-06-14 DIAGNOSIS — E1122 Type 2 diabetes mellitus with diabetic chronic kidney disease: Secondary | ICD-10-CM | POA: Diagnosis not present

## 2020-06-14 DIAGNOSIS — F0390 Unspecified dementia without behavioral disturbance: Secondary | ICD-10-CM | POA: Insufficient documentation

## 2020-06-14 DIAGNOSIS — S0990XA Unspecified injury of head, initial encounter: Secondary | ICD-10-CM

## 2020-06-14 DIAGNOSIS — W19XXXA Unspecified fall, initial encounter: Secondary | ICD-10-CM | POA: Insufficient documentation

## 2020-06-14 DIAGNOSIS — Z79899 Other long term (current) drug therapy: Secondary | ICD-10-CM | POA: Diagnosis not present

## 2020-06-14 DIAGNOSIS — I129 Hypertensive chronic kidney disease with stage 1 through stage 4 chronic kidney disease, or unspecified chronic kidney disease: Secondary | ICD-10-CM | POA: Diagnosis not present

## 2020-06-14 DIAGNOSIS — S0003XA Contusion of scalp, initial encounter: Secondary | ICD-10-CM | POA: Insufficient documentation

## 2020-06-14 DIAGNOSIS — N183 Chronic kidney disease, stage 3 unspecified: Secondary | ICD-10-CM | POA: Insufficient documentation

## 2020-06-14 HISTORY — DX: Cerebral infarction, unspecified: I63.9

## 2020-06-14 NOTE — ED Triage Notes (Signed)
EMS brings pt in from [facility] for unwitnessed fall; hematoma to occipitut, no c/o pain; hx dementia; currently taking plavix

## 2020-06-14 NOTE — ED Provider Notes (Signed)
[facility] Emergency Department Provider Note  Time seen: 9:47 PM  I have reviewed the triage vital signs and the nursing notes.   HISTORY  Chief Complaint Fall   HPI Ian Jimenez is a 86 y.o. male with a past medical history of dementia, diabetes, hypertension, prior CVA, presents to the emergency department from his nursing facility after a fall.  Patient is here with his daughter.  Patient suffered a hematoma to the back of the head, is otherwise acting at baseline per daughter.  No other complaints.   Past Medical History:  Diagnosis Date  . Dementia ([place])   . Diabetes mellitus without complication ([place])   . Hypertension   . Stroke [facility])     Patient Active Problem List   Diagnosis Date Noted  . Hypertension associated with diabetes ([place]) 05/06/2018  . Stage 3 chronic renal impairment associated with type 2 diabetes mellitus ([place]) 05/06/2018  . Dyslipidemia associated with type 2 diabetes mellitus ([place]) 05/06/2018  . Controlled type 2 diabetes mellitus with stage 3 chronic kidney disease ([place]) 05/06/2018  . Vascular dementia without behavioral disturbance ([place]) 05/06/2018  . Depression due to dementia ([place]) 05/06/2018  . GERD without esophagitis 05/06/2018  . Hypertensive urgency 04/30/2018  . TIA (transient ischemic attack) 04/10/2018    Past Surgical History:  Procedure Laterality Date  . none      Prior to Admission medications   Medication Sig Start Date End Date Taking? Authorizing Provider  acetaminophen (TYLENOL) 325 MG tablet Take 2 tablets (650 mg total) by mouth every 6 (six) hours as needed for mild pain (or Fever >/= 101). 05/03/18   Salary, Avel Peace, MD  amLODipine (NORVASC) 10 MG tablet Take 1 tablet (10 mg total) by mouth daily. 05/04/18   Salary, Avel Peace, MD  donepezil (ARICEPT) 10 MG tablet Take 10 mg by mouth at bedtime.    [provider]  escitalopram (LEXAPRO) 5 MG tablet Take 5 mg by mouth daily. 03/12/18    [provider]  glipiZIDE (GLUCOTROL) 5 MG tablet Take 1 tablet (5 mg total) by mouth 2 (two) times daily before a meal. 04/11/18   Gladstone Lighter, MD  hydrALAZINE (APRESOLINE) 25 MG tablet Take 25 mg by mouth 2 (two) times daily. 05/13/18   [provider]  metFORMIN (GLUCOPHAGE) 1000 MG tablet Take 1,000 mg by mouth 2 (two) times daily with a meal.    [provider]  metoprolol tartrate (LOPRESSOR) 25 MG tablet Take 1 tablet (25 mg total) by mouth 2 (two) times daily. 05/03/18   Salary, Avel Peace, MD  NON FORMULARY Diet type: Carb Diet    [provider]  omeprazole (PRILOSEC) 40 MG capsule Take 40 mg by mouth daily. 05/07/18   [provider]    No Known Allergies  Family History  Problem Relation Age of Onset  . Heart disease Father     Social History Social History   Tobacco Use  . Smoking status: Never Smoker  . Smokeless tobacco: Never Used  Vaping Use  . Vaping Use: Never used  Substance Use Topics  . Alcohol use: Never  . Drug use: Never    Review of Systems Per patient and daughter Constitutional: Negative for reported LOC Cardiovascular: Negative for chest pain. Respiratory: Negative for shortness of breath. Gastrointestinal: Negative for abdominal pain Musculoskeletal: Negative for musculoskeletal complaints Skin: Ephriam Jenkins to back of scalp Neurological: Negative for headache All other ROS negative, although possibly limited by dementia.  ____________________________________________  PHYSICAL EXAM:  VITAL SIGNS: ED Triage Vitals  Enc Vitals Group     BP 06/14/20 1958 (!) 168/59     Pulse Rate 06/14/20 1958 62     Resp 06/14/20 1958 14     Temp 06/14/20 1958 99 F (37.2 C)     Temp Source 06/14/20 1958 Oral     SpO2 06/14/20 1949 95 %     Weight --      Height --      Head Circumference --      Peak Flow --      Pain Score --      Pain Loc --      Pain Edu? --      Excl. in [place]? --      Constitutional: Awake alert sitting in wheelchair, no distress. Eyes: Normal exam ENT      Head: Moderate hematoma to occipital scalp with very small abrasion.  Hemostatic.  No laceration.      Mouth/Throat: Mucous membranes are moist. Cardiovascular: Normal rate, regular rhythm.  Respiratory: Normal respiratory effort without tachypnea nor retractions. Breath sounds are clear  Gastrointestinal: Soft and nontender. No distention.   Musculoskeletal: Nontender with normal range of motion in all extremities. Neurologic:  Normal speech and language. No gross focal neurologic deficits Skin:  Skin is warm, dry and intact.  Psychiatric: Mood and affect are normal.   ____________________________________________  RADIOLOGY  CT scan of the head shows occipital scalp hematoma otherwise negative.  ____________________________________________   INITIAL IMPRESSION / ASSESSMENT AND PLAN / ED COURSE  Pertinent labs & imaging results that were available during my care of the patient were reviewed by me and considered in my medical decision making (see chart for details).   Patient presents emergency department after a fall from his nursing facility.  Patient fell and hit the back of his head has an occipital scalp hematoma.  Patient is on Plavix.  CT scan of the head shows no intracranial abnormality.  Overall the patient appears well.  Great range of motion in all extremities, no other pain complaints, denies headache.  We will discharge patient back to his nursing facility with the patient's daughter.  Ariv Penrod was evaluated in Emergency Department on 06/14/2020 for the symptoms described in the history of present illness. He was evaluated in the context of the global COVID-19 pandemic, which necessitated consideration that the patient might be at risk for infection with the SARS-CoV-2 virus that causes COVID-19. Institutional protocols and algorithms that pertain to the evaluation of patients  at risk for COVID-19 are in a state of rapid change based on information released by regulatory bodies including the CDC and federal and state organizations. These policies and algorithms were followed during the patient's care in the ED.  ____________________________________________   FINAL CLINICAL IMPRESSION(S) / ED DIAGNOSES  Fall Head injury   Harvest Dark, MD 06/14/20 2150

## 2020-06-14 NOTE — ED Triage Notes (Signed)
Pt presents from [facility] s/p unwitnessed fall. Pt takes Plavix and has hematoma to back of head. Pt in no acute distress at this time. Pt reports head pain.

## 2020-06-14 NOTE — ED Triage Notes (Signed)
Daughter reports multiple falls at facility. Pt has hx dementia.

## 2020-06-14 NOTE — Discharge Instructions (Addendum)
Been seen in the emergency department tonight after a fall.  You have suffered a hematoma to the back of the head scan of the head is normal.  Return to the emergency department for any symptom personally concerning to yourself or staff members.

## 2020-06-14 NOTE — ED Notes (Signed)
Pt sitting in wheelchair, daughter at bedside, denies pain, daughter denies change in behavior from normal baseline, pt has had multiple falls over the last month at facility.  Pt with hx of dementia.

## 2020-07-03 ENCOUNTER — Other Ambulatory Visit: Payer: Self-pay | Admitting: Internal Medicine

## 2020-07-03 DIAGNOSIS — Z9189 Other specified personal risk factors, not elsewhere classified: Secondary | ICD-10-CM

## 2020-07-03 DIAGNOSIS — R633 Feeding difficulties, unspecified: Secondary | ICD-10-CM

## 2020-07-06 ENCOUNTER — Ambulatory Visit
Admission: RE | Admit: 2020-07-06 | Discharge: 2020-07-06 | Disposition: A | Discharge status: Home / Self Care | Payer: Medicare Other | Source: Ambulatory Visit | Attending: Internal Medicine | Admitting: Internal Medicine

## 2020-07-06 ENCOUNTER — Other Ambulatory Visit: Payer: Self-pay

## 2020-07-06 ENCOUNTER — Other Ambulatory Visit: Payer: Self-pay | Admitting: Internal Medicine

## 2020-07-06 DIAGNOSIS — R1310 Dysphagia, unspecified: Secondary | ICD-10-CM | POA: Insufficient documentation

## 2020-07-06 DIAGNOSIS — R633 Feeding difficulties, unspecified: Secondary | ICD-10-CM | POA: Diagnosis present

## 2020-07-06 NOTE — Progress Notes (Signed)
Modified Barium Swallow Progress Note  Patient Details  Name: Ian Jimenez MRN: [id number] Date of Birth: 08-Mar-1934  Today's Date: 07/06/2020  Modified Barium Swallow completed.  Full report located under Chart Review in the Imaging Section.  Brief recommendations include the following:  Clinical Impression  Pt presents with mild oropharyngeal dysphagia that is c/b decreased lingual manipulation of mechanical soft, puree, nectar thick liquids and thin liquids. Despite this, he establishes bolus cohesion during posterior propulsion of boluses with swallow initiation at the level of the vallecula and occasionally at the pyriform sinuses. Pt had one instance of flash penetration when consuming thin liquids but penetrates were ejected during swallow. Under fluro, there appears some stasis of boluses in the upper esophagus. Education provided to pt's daughter on possible stasis and recommendations for pt to upright and remain upright after PO intake. If daughter feels that removing certain items such as bread would improve pt's quality of life then of course that is appropriate as well.    Swallow Evaluation Recommendations       SLP Diet Recommendations: Regular solids;Thin liquid   Liquid Administration via: Cup   Medication Administration: Whole meds with liquid   Supervision: Full assist for feeding   Compensations: Minimize environmental distractions;Slow rate;Small sips/bites   Postural Changes: Seated upright at 90 degrees   Oral Care Recommendations: Oral care BID      Madelein Mahadeo B. Rutherford Nail M.S., [place], [place] Office [phone]   Lillis Nuttle 07/06/2020,4:11 PM

## 2020-07-16 ENCOUNTER — Non-Acute Institutional Stay: Payer: Medicare Other | Admitting: Adult Health Nurse Practitioner

## 2020-07-16 ENCOUNTER — Other Ambulatory Visit: Payer: Self-pay

## 2020-07-16 DIAGNOSIS — F0150 Vascular dementia without behavioral disturbance: Secondary | ICD-10-CM

## 2020-07-16 DIAGNOSIS — Z515 Encounter for palliative care: Secondary | ICD-10-CM

## 2020-07-16 DIAGNOSIS — L89301 Pressure ulcer of unspecified buttock, stage 1: Secondary | ICD-10-CM

## 2020-07-16 DIAGNOSIS — R5381 Other malaise: Secondary | ICD-10-CM

## 2020-07-16 NOTE — Progress Notes (Signed)
[facility] Consult Note Telephone: [phone]  Fax: [phone]  PATIENT NAME: Ian Jimenez DOB: January 15, 1934 MRN: [id number]  PRIMARY CARE PROVIDER:   Albina Billet, MD  REFERRING PROVIDER:  Dr. Frazier Richards  RESPONSIBLE PARTY:   Dr. Marilynne Halsted , daughter [phone]  Chief complaint:  Initial palliative consult/complex medical decision making/debility    RECOMMENDATIONS and PLAN:  1.  Advanced care planning. Patient is DNR.  Spoke with daughter to update on visit.  Discussed her father's decline and hospice eligibility.  Have reached out to hospice physicians who are in agreement with hospice eligibility.  Discussed hospice at home versus hospice at facility.  Would like to have in person discussion with her mother and have scheduled for this Friday.  2.  Vascular dementia/debility.  Patient having functional decline now requiring total care.  Have been using a lift for transfers over the past week.  Patient having dysphagia and was evaluated by ST and swallow study done.  Daughter states that it did not show anything but is mild aspiration risk and he should be having chopped foods.  Staff does state that sitters do cut up his food at breakfast and dinner.  No agitation/anxiety reported by staff.  Continue supported care at facility.  3.  Pressure injury.  Patient having stage II pressure injury to buttocks which could be a sign of protein calorie malnutrition and of his decreased activity.  Reports that he is eating well but having weigh loss could be a sign of underlying undiagnosed disease process.  Though in present debility may not be a candidate for aggressive diagnosis/treatment.  Continue wound care at facility.  Will continue to discuss options of hospice at home or at facility.  Palliative will continue to monitor for symptom management/decline and make recommendations as needed until further discussion about hospice and  when to start is had with family.      HISTORY OF PRESENT ILLNESS:  Ian Jimenez is a 85 y.o. year old male with multiple medical problems including dementia, DMT2, HTN, CKD stage 3, dysphagia. Palliative Care was asked to help address goals of care. Reviewed last labs which are unfortunately from over a year ago.  Patient was living with his wife in independent living apartment at [place].  Was transitioned to ALF at facility in October this year for respite while wife had surgery.  He has been there since due to decline.  Patient had fall on 06/14/20 in which he hit his head.  He was evaluated at ER with no acute findings per CT of head.  Patient has had functional decline since being at facility. He used to be able to assist with transfers and now requires total care and a lift for transfers.  Staff and daughter state that his appetite is good.  Yet he has had an 8% weight loss over the past month.  Last month he weighed 167.3 pounds and this month he weighs 153.7 pounds.  Patient does have 3 small stage II pressure injury to buttocks which are not getting worse due to repositioning and application of barrier cream.  10 point ROS asked and negative but unreliable secondary to dementia.  HPI obtained from staff at facility and daughter.    CODE STATUS: DNR  PPS: 30% HOSPICE ELIGIBILITY/DIAGNOSIS: TBD  PHYSICAL EXAM:  HR 62  O2 97% on RA General: NAD, frail appearing Eyes: sclera anicteric and noninjected with no discharge noted ENMT: moist mucous  membranes Cardiovascular: regular rate and rhythm Pulmonary: Lung sounds clear; normal respiratory effort Abdomen: soft, nontender, + bowel sounds Extremities: no edema, no joint deformities Skin: no rashes on exposed skin; 3 small stage II pressure injury to buttocks with redness that is blanchable around them Neurological: Alert and oriented to person   Family History  Problem Relation Age of Onset  . Heart disease Father         PAST MEDICAL HISTORY:  Past Medical History:  Diagnosis Date  . Dementia ([place])   . Diabetes mellitus without complication ([place])   . Hypertension   . Stroke [facility])     SOCIAL HX:  Social History   Tobacco Use  . Smoking status: Never Smoker  . Smokeless tobacco: Never Used  Substance Use Topics  . Alcohol use: Never    ALLERGIES: No Known Allergies   PERTINENT MEDICATIONS:  Outpatient Encounter Medications as of 07/16/2020  Medication Sig  . acetaminophen (TYLENOL) 325 MG tablet Take 2 tablets (650 mg total) by mouth every 6 (six) hours as needed for mild pain (or Fever >/= 101).  Marland Kitchen amLODipine (NORVASC) 10 MG tablet Take 1 tablet (10 mg total) by mouth daily.  Marland Kitchen donepezil (ARICEPT) 10 MG tablet Take 10 mg by mouth at bedtime.  Marland Kitchen escitalopram (LEXAPRO) 5 MG tablet Take 5 mg by mouth daily.  Marland Kitchen glipiZIDE (GLUCOTROL) 5 MG tablet Take 1 tablet (5 mg total) by mouth 2 (two) times daily before a meal.  . hydrALAZINE (APRESOLINE) 25 MG tablet Take 25 mg by mouth 2 (two) times daily.  . metFORMIN (GLUCOPHAGE) 1000 MG tablet Take 1,000 mg by mouth 2 (two) times daily with a meal.  . metoprolol tartrate (LOPRESSOR) 25 MG tablet Take 1 tablet (25 mg total) by mouth 2 (two) times daily.  . NON FORMULARY Diet type: Carb Diet  . omeprazole (PRILOSEC) 40 MG capsule Take 40 mg by mouth daily.   No facility-administered encounter medications on file as of 07/16/2020.    Ian Jimenez Jenetta Downer, NP

## 2020-07-20 ENCOUNTER — Other Ambulatory Visit: Payer: Self-pay

## 2020-07-20 ENCOUNTER — Non-Acute Institutional Stay: Payer: Medicare Other | Admitting: Adult Health Nurse Practitioner

## 2020-07-20 DIAGNOSIS — F0150 Vascular dementia without behavioral disturbance: Secondary | ICD-10-CM

## 2020-07-20 DIAGNOSIS — R5381 Other malaise: Secondary | ICD-10-CM

## 2020-07-20 DIAGNOSIS — Z515 Encounter for palliative care: Secondary | ICD-10-CM

## 2020-07-20 NOTE — Progress Notes (Signed)
[facility] Consult Note Telephone: [phone]  Fax: [phone]  PATIENT NAME: Ian Jimenez DOB: [birth year] MRN: [id number]  PRIMARY CARE PROVIDER:   Dr. Frazier Richards  RESPONSIBLE PARTY:   Dr. Marilynne Halsted , daughter [phone]  Chief complaint:  complex medical decision making/debility    RECOMMENDATIONS and PLAN:  1.  Advanced care planning. Patient is DNR.   2.  Dementia/debility.  This is a nonface-to-face meeting with patient's wife and daughter in follow-up to patient visit on 07/16/2020.  Discussed at length hospice eligibility criteria and how patient meets these criteria.  Discussed hospice services and what to expect.  Discussed at length keeping patient in the facility or bringing him home.  Wanted to make sure that wife was aware that if he was brought home he will require 24/7 care as he is requiring a lift at the facility for transfers.  He is also requiring pressure injury is going to require frequent repositioning and monitoring around-the-clock to make sure that he is clean and dry to prevent further injury.  Discussed that if he were to be brought home that there is equipment that is going to be required such as a lift in hospital bed.  Patient is working with PT/OT at facility and wife and daughter would like to keep this up for now while he is still able to work with them.  Wife will need time to think about all the things discussed today before making a decision.  Palliative will continue to monitor for symptom management/decline and make recommendations as needed.  We will support family with ever decision they make whether transitioning to hospice or staying with palliative and whether that will take place in the facility or at home.  Patient will need to social work involvement if transitioning back home to make sure he gets the DME and help that he needs.  I spent 70 minutes providing this consultation.  More than 50% of the time in this consultation was spent coordinating communication.   HISTORY OF PRESENT ILLNESS:  Ian Jimenez is a 86 y.o. year old male with multiple medical problems including dementia, DMT2, HTN, CKD stage 3, dysphagia. Palliative Care was asked to help address goals of care.   CODE STATUS: DNR  PPS: 30% HOSPICE ELIGIBILITY/DIAGNOSIS: TBD  PAST MEDICAL HISTORY:  Past Medical History:  Diagnosis Date  . Dementia ([place])   . Diabetes mellitus without complication ([place])   . Hypertension   . Stroke [facility])     SOCIAL HX:  Social History   Tobacco Use  . Smoking status: Never Smoker  . Smokeless tobacco: Never Used  Substance Use Topics  . Alcohol use: Never    ALLERGIES: No Known Allergies   PERTINENT MEDICATIONS:  Outpatient Encounter Medications as of 07/20/2020  Medication Sig  . acetaminophen (TYLENOL) 325 MG tablet Take 2 tablets (650 mg total) by mouth every 6 (six) hours as needed for mild pain (or Fever >/= 101).  Marland Kitchen amLODipine (NORVASC) 10 MG tablet Take 1 tablet (10 mg total) by mouth daily.  Marland Kitchen donepezil (ARICEPT) 10 MG tablet Take 10 mg by mouth at bedtime.  Marland Kitchen escitalopram (LEXAPRO) 5 MG tablet Take 5 mg by mouth daily.  Marland Kitchen glipiZIDE (GLUCOTROL) 5 MG tablet Take 1 tablet (5 mg total) by mouth 2 (two) times daily before a meal.  . hydrALAZINE (APRESOLINE) 25 MG tablet Take 25 mg by mouth 2 (two) times daily.  . metFORMIN (GLUCOPHAGE) 1000 MG tablet  Take 1,000 mg by mouth 2 (two) times daily with a meal.  . metoprolol tartrate (LOPRESSOR) 25 MG tablet Take 1 tablet (25 mg total) by mouth 2 (two) times daily.  . NON FORMULARY Diet type: Carb Diet  . omeprazole (PRILOSEC) 40 MG capsule Take 40 mg by mouth daily.   No facility-administered encounter medications on file as of 07/20/2020.     Khalessi Blough Jenetta Downer, NP

## 2020-07-20 NOTE — Addendum Note (Signed)
Addended by: Berneta Sages on: 07/20/2020 05:28 PM   Modules accepted: Level of Service

## 2020-08-15 ENCOUNTER — Other Ambulatory Visit
Admission: RE | Admit: 2020-08-15 | Discharge: 2020-08-15 | Disposition: A | Discharge status: Home / Self Care | Payer: Medicare Other | Source: Skilled Nursing Facility | Attending: Internal Medicine | Admitting: Internal Medicine

## 2020-08-15 DIAGNOSIS — I129 Hypertensive chronic kidney disease with stage 1 through stage 4 chronic kidney disease, or unspecified chronic kidney disease: Secondary | ICD-10-CM | POA: Diagnosis present

## 2020-08-15 DIAGNOSIS — E1122 Type 2 diabetes mellitus with diabetic chronic kidney disease: Secondary | ICD-10-CM | POA: Diagnosis not present

## 2020-08-15 DIAGNOSIS — N189 Chronic kidney disease, unspecified: Secondary | ICD-10-CM | POA: Insufficient documentation

## 2020-08-15 LAB — HEMOGLOBIN A1C
Hgb A1c MFr Bld: 6.4 % — ABNORMAL HIGH (ref 4.8–5.6)
Mean Plasma Glucose: 136.98 mg/dL

## 2020-08-28 ENCOUNTER — Other Ambulatory Visit
Admission: RE | Admit: 2020-08-28 | Discharge: 2020-08-28 | Disposition: A | Discharge status: Home / Self Care | Payer: Medicare Other | Source: Skilled Nursing Facility | Attending: Internal Medicine | Admitting: Internal Medicine

## 2020-08-28 DIAGNOSIS — E119 Type 2 diabetes mellitus without complications: Secondary | ICD-10-CM | POA: Diagnosis present

## 2020-08-28 DIAGNOSIS — N189 Chronic kidney disease, unspecified: Secondary | ICD-10-CM | POA: Insufficient documentation

## 2020-08-28 DIAGNOSIS — I10 Essential (primary) hypertension: Secondary | ICD-10-CM | POA: Insufficient documentation

## 2020-08-28 LAB — COMPREHENSIVE METABOLIC PANEL
ALT: 12 U/L (ref 0–44)
AST: 15 U/L (ref 15–41)
Albumin: 3 g/dL — ABNORMAL LOW (ref 3.5–5.0)
Alkaline Phosphatase: 59 U/L (ref 38–126)
Anion gap: 12 (ref 5–15)
BUN: 35 mg/dL — ABNORMAL HIGH (ref 8–23)
CO2: 24 mmol/L (ref 22–32)
Calcium: 8.4 mg/dL — ABNORMAL LOW (ref 8.9–10.3)
Chloride: 104 mmol/L (ref 98–111)
Creatinine, Ser: 2.29 mg/dL — ABNORMAL HIGH (ref 0.61–1.24)
GFR, Estimated: 27 mL/min — ABNORMAL LOW (ref >60)
Glucose, Bld: 147 mg/dL — ABNORMAL HIGH (ref 70–99)
Potassium: 3.8 mmol/L (ref 3.5–5.1)
Sodium: 140 mmol/L (ref 135–145)
Total Bilirubin: 0.4 mg/dL (ref 0.3–1.2)
Total Protein: 6.4 g/dL — ABNORMAL LOW (ref 6.5–8.1)

## 2020-08-29 ENCOUNTER — Non-Acute Institutional Stay: Payer: Medicare Other | Admitting: Adult Health Nurse Practitioner

## 2020-08-29 ENCOUNTER — Other Ambulatory Visit: Payer: Self-pay

## 2020-08-29 DIAGNOSIS — Z515 Encounter for palliative care: Secondary | ICD-10-CM

## 2020-08-29 DIAGNOSIS — F0150 Vascular dementia without behavioral disturbance: Secondary | ICD-10-CM

## 2020-08-29 DIAGNOSIS — R5381 Other malaise: Secondary | ICD-10-CM

## 2020-08-29 NOTE — Progress Notes (Signed)
[facility] Consult Note Telephone: [phone]  Fax: [phone]  PATIENT NAME: Ian Jimenez DOB: 1934/05/10 MRN: [id number]  PRIMARY CARE PROVIDER:  Dr. Frazier Richards  REFERRING PROVIDER:  Dr. Frazier Richards  RESPONSIBLE PARTY:   Dr. Marilynne Halsted , daughter [phone]  Chief complaint:  Initial palliative consult/debility    RECOMMENDATIONS and PLAN:  1.  Advanced care planning. Patient is DNR.  will reach out to daughter with update on visit  2.  Dementia.  Patient still requires help with ADLs.  Is able to walk with assistance.  No reported anxiety/agitation.  Continue supportive care at facility  3.  Debility.  Patient doing well with PT.  Continue PT as ordered.  Palliative will continue to monitor for symptom management/decline and make recommendations as needed.  Will follow up in 6-8 weeks.  I spent 25 minutes providing this consultation. More than 50% of the time in this consultation was spent coordinating communication.   HISTORY OF PRESENT ILLNESS:  Ian Jimenez is a 88 y.o. year old male with multiple medical problems including dementia, DMT2, HTN, CKD stage 3, dysphagia. Palliative Care was asked to help address goals of care. Patient working with PT during visit today.  Staff does report that he has been doing well with PT and with PT's help is able to stand up and ambulate with a walker.  His appetite is good and current weight is 157.5 pounds.  This is down from 167.3 pounds in November 2021.  Patient no longer has any pressure wounds.  Patient has not had any falls, infections, or hospital visits since last visit.   CODE STATUS: DNR  PPS: 30% HOSPICE ELIGIBILITY/DIAGNOSIS: TBD  PHYSICAL EXAM:   General: NAD, frail appearing Eyes: sclera anicteric and noninjected with no discharge noted Extremities: no edema, no joint deformities Skin: no rashes on exposed skin Neurological: Alert and oriented to  person  PAST MEDICAL HISTORY:  Past Medical History:  Diagnosis Date  . Dementia ([place])   . Diabetes mellitus without complication ([place])   . Hypertension   . Stroke [facility])     SOCIAL HX:  Social History   Tobacco Use  . Smoking status: Never Smoker  . Smokeless tobacco: Never Used  Substance Use Topics  . Alcohol use: Never    ALLERGIES: No Known Allergies   PERTINENT MEDICATIONS:  Outpatient Encounter Medications as of 08/29/2020  Medication Sig  . acetaminophen (TYLENOL) 325 MG tablet Take 2 tablets (650 mg total) by mouth every 6 (six) hours as needed for mild pain (or Fever >/= 101).  Marland Kitchen amLODipine (NORVASC) 10 MG tablet Take 1 tablet (10 mg total) by mouth daily.  Marland Kitchen donepezil (ARICEPT) 10 MG tablet Take 10 mg by mouth at bedtime.  Marland Kitchen escitalopram (LEXAPRO) 5 MG tablet Take 5 mg by mouth daily.  Marland Kitchen glipiZIDE (GLUCOTROL) 5 MG tablet Take 1 tablet (5 mg total) by mouth 2 (two) times daily before a meal.  . hydrALAZINE (APRESOLINE) 25 MG tablet Take 25 mg by mouth 2 (two) times daily.  . metFORMIN (GLUCOPHAGE) 1000 MG tablet Take 1,000 mg by mouth 2 (two) times daily with a meal.  . metoprolol tartrate (LOPRESSOR) 25 MG tablet Take 1 tablet (25 mg total) by mouth 2 (two) times daily.  . NON FORMULARY Diet type: Carb Diet  . omeprazole (PRILOSEC) 40 MG capsule Take 40 mg by mouth daily.   No facility-administered encounter medications on file as of 08/29/2020.  Antawan Mchugh Jenetta Downer, NP

## 2020-08-30 ENCOUNTER — Telehealth: Payer: Self-pay | Admitting: Adult Health Nurse Practitioner

## 2020-08-30 NOTE — Telephone Encounter (Signed)
Called daughter to update on yesterday's visit with her father.  Left VM with reason for call and call back info. Keante Urizar K. Olena Heckle NP

## 2020-09-05 ENCOUNTER — Telehealth: Payer: Self-pay | Admitting: Adult Health Nurse Practitioner

## 2020-09-05 NOTE — Telephone Encounter (Signed)
Returned daughter's VM to update on visit last week.  States that neuro put him on a medication to with tremors believed to be caused by mini strokes.  Has no new concerns at this time. Encouraged to call with any questions or concerns. Dessiree Sze K. Olena Heckle NP

## 2020-10-01 ENCOUNTER — Inpatient Hospital Stay: Payer: Medicare Other

## 2020-10-01 ENCOUNTER — Inpatient Hospital Stay
Admission: EM | Admit: 2020-10-01 | Discharge: 2020-10-06 | DRG: 871 | Disposition: A | Discharge status: Hospice | Payer: Medicare Other | Attending: Obstetrics and Gynecology | Admitting: Obstetrics and Gynecology

## 2020-10-01 ENCOUNTER — Emergency Department: Payer: Medicare Other

## 2020-10-01 ENCOUNTER — Other Ambulatory Visit: Payer: Self-pay

## 2020-10-01 ENCOUNTER — Inpatient Hospital Stay (HOSPITAL_COMMUNITY)
Admit: 2020-10-01 | Discharge: 2020-10-01 | Disposition: A | Discharge status: Home / Self Care | Payer: Medicare Other | Attending: Internal Medicine | Admitting: Internal Medicine

## 2020-10-01 DIAGNOSIS — Z8546 Personal history of malignant neoplasm of prostate: Secondary | ICD-10-CM

## 2020-10-01 DIAGNOSIS — I429 Cardiomyopathy, unspecified: Secondary | ICD-10-CM | POA: Diagnosis present

## 2020-10-01 DIAGNOSIS — K219 Gastro-esophageal reflux disease without esophagitis: Secondary | ICD-10-CM | POA: Diagnosis present

## 2020-10-01 DIAGNOSIS — I272 Pulmonary hypertension, unspecified: Secondary | ICD-10-CM | POA: Diagnosis not present

## 2020-10-01 DIAGNOSIS — F0393 Unspecified dementia, unspecified severity, with mood disturbance: Secondary | ICD-10-CM | POA: Diagnosis present

## 2020-10-01 DIAGNOSIS — F0280 Dementia in other diseases classified elsewhere without behavioral disturbance: Secondary | ICD-10-CM | POA: Diagnosis present

## 2020-10-01 DIAGNOSIS — E1159 Type 2 diabetes mellitus with other circulatory complications: Secondary | ICD-10-CM

## 2020-10-01 DIAGNOSIS — I214 Non-ST elevation (NSTEMI) myocardial infarction: Secondary | ICD-10-CM

## 2020-10-01 DIAGNOSIS — Z515 Encounter for palliative care: Secondary | ICD-10-CM | POA: Diagnosis not present

## 2020-10-01 DIAGNOSIS — N184 Chronic kidney disease, stage 4 (severe): Secondary | ICD-10-CM | POA: Diagnosis present

## 2020-10-01 DIAGNOSIS — E785 Hyperlipidemia, unspecified: Secondary | ICD-10-CM | POA: Diagnosis present

## 2020-10-01 DIAGNOSIS — A419 Sepsis, unspecified organism: Secondary | ICD-10-CM | POA: Diagnosis not present

## 2020-10-01 DIAGNOSIS — N179 Acute kidney failure, unspecified: Secondary | ICD-10-CM | POA: Diagnosis not present

## 2020-10-01 DIAGNOSIS — A4159 Other Gram-negative sepsis: Principal | ICD-10-CM | POA: Diagnosis present

## 2020-10-01 DIAGNOSIS — Z20822 Contact with and (suspected) exposure to covid-19: Secondary | ICD-10-CM | POA: Diagnosis present

## 2020-10-01 DIAGNOSIS — I5021 Acute systolic (congestive) heart failure: Secondary | ICD-10-CM | POA: Diagnosis present

## 2020-10-01 DIAGNOSIS — E1169 Type 2 diabetes mellitus with other specified complication: Secondary | ICD-10-CM | POA: Diagnosis present

## 2020-10-01 DIAGNOSIS — F32A Depression, unspecified: Secondary | ICD-10-CM | POA: Diagnosis present

## 2020-10-01 DIAGNOSIS — F0390 Unspecified dementia without behavioral disturbance: Secondary | ICD-10-CM | POA: Diagnosis not present

## 2020-10-01 DIAGNOSIS — Z8249 Family history of ischemic heart disease and other diseases of the circulatory system: Secondary | ICD-10-CM

## 2020-10-01 DIAGNOSIS — R6520 Severe sepsis without septic shock: Secondary | ICD-10-CM | POA: Diagnosis not present

## 2020-10-01 DIAGNOSIS — E875 Hyperkalemia: Secondary | ICD-10-CM | POA: Diagnosis present

## 2020-10-01 DIAGNOSIS — I130 Hypertensive heart and chronic kidney disease with heart failure and stage 1 through stage 4 chronic kidney disease, or unspecified chronic kidney disease: Secondary | ICD-10-CM | POA: Diagnosis present

## 2020-10-01 DIAGNOSIS — E1122 Type 2 diabetes mellitus with diabetic chronic kidney disease: Secondary | ICD-10-CM | POA: Diagnosis present

## 2020-10-01 DIAGNOSIS — Z66 Do not resuscitate: Secondary | ICD-10-CM | POA: Diagnosis present

## 2020-10-01 DIAGNOSIS — Z7902 Long term (current) use of antithrombotics/antiplatelets: Secondary | ICD-10-CM

## 2020-10-01 DIAGNOSIS — N10 Acute tubulo-interstitial nephritis: Secondary | ICD-10-CM | POA: Diagnosis present

## 2020-10-01 DIAGNOSIS — Z1611 Resistance to penicillins: Secondary | ICD-10-CM | POA: Diagnosis present

## 2020-10-01 DIAGNOSIS — J9601 Acute respiratory failure with hypoxia: Secondary | ICD-10-CM | POA: Diagnosis present

## 2020-10-01 DIAGNOSIS — G459 Transient cerebral ischemic attack, unspecified: Secondary | ICD-10-CM | POA: Diagnosis present

## 2020-10-01 DIAGNOSIS — E1165 Type 2 diabetes mellitus with hyperglycemia: Secondary | ICD-10-CM | POA: Diagnosis present

## 2020-10-01 DIAGNOSIS — Z7984 Long term (current) use of oral hypoglycemic drugs: Secondary | ICD-10-CM

## 2020-10-01 DIAGNOSIS — G9341 Metabolic encephalopathy: Secondary | ICD-10-CM | POA: Diagnosis present

## 2020-10-01 DIAGNOSIS — Z8673 Personal history of transient ischemic attack (TIA), and cerebral infarction without residual deficits: Secondary | ICD-10-CM | POA: Diagnosis not present

## 2020-10-01 DIAGNOSIS — G20 Parkinson's disease: Secondary | ICD-10-CM | POA: Diagnosis present

## 2020-10-01 DIAGNOSIS — Z79899 Other long term (current) drug therapy: Secondary | ICD-10-CM

## 2020-10-01 DIAGNOSIS — A414 Sepsis due to anaerobes: Secondary | ICD-10-CM

## 2020-10-01 DIAGNOSIS — N1831 Chronic kidney disease, stage 3a: Secondary | ICD-10-CM | POA: Diagnosis not present

## 2020-10-01 DIAGNOSIS — R778 Other specified abnormalities of plasma proteins: Secondary | ICD-10-CM | POA: Diagnosis not present

## 2020-10-01 DIAGNOSIS — N329 Bladder disorder, unspecified: Secondary | ICD-10-CM | POA: Diagnosis present

## 2020-10-01 DIAGNOSIS — E872 Acidosis: Secondary | ICD-10-CM | POA: Diagnosis present

## 2020-10-01 DIAGNOSIS — N186 End stage renal disease: Secondary | ICD-10-CM | POA: Diagnosis not present

## 2020-10-01 DIAGNOSIS — I152 Hypertension secondary to endocrine disorders: Secondary | ICD-10-CM | POA: Diagnosis present

## 2020-10-01 DIAGNOSIS — I5020 Unspecified systolic (congestive) heart failure: Secondary | ICD-10-CM | POA: Diagnosis not present

## 2020-10-01 DIAGNOSIS — D649 Anemia, unspecified: Secondary | ICD-10-CM | POA: Diagnosis not present

## 2020-10-01 DIAGNOSIS — N170 Acute kidney failure with tubular necrosis: Secondary | ICD-10-CM | POA: Diagnosis present

## 2020-10-01 DIAGNOSIS — N3289 Other specified disorders of bladder: Secondary | ICD-10-CM | POA: Insufficient documentation

## 2020-10-01 DIAGNOSIS — R6521 Severe sepsis with septic shock: Secondary | ICD-10-CM | POA: Diagnosis present

## 2020-10-01 DIAGNOSIS — Z7189 Other specified counseling: Secondary | ICD-10-CM | POA: Diagnosis not present

## 2020-10-01 DIAGNOSIS — I447 Left bundle-branch block, unspecified: Secondary | ICD-10-CM | POA: Diagnosis present

## 2020-10-01 DIAGNOSIS — N136 Pyonephrosis: Secondary | ICD-10-CM | POA: Diagnosis present

## 2020-10-01 DIAGNOSIS — N1330 Unspecified hydronephrosis: Secondary | ICD-10-CM | POA: Diagnosis not present

## 2020-10-01 DIAGNOSIS — D631 Anemia in chronic kidney disease: Secondary | ICD-10-CM | POA: Diagnosis present

## 2020-10-01 DIAGNOSIS — E1129 Type 2 diabetes mellitus with other diabetic kidney complication: Secondary | ICD-10-CM | POA: Diagnosis present

## 2020-10-01 DIAGNOSIS — E1121 Type 2 diabetes mellitus with diabetic nephropathy: Secondary | ICD-10-CM | POA: Diagnosis not present

## 2020-10-01 DIAGNOSIS — R310 Gross hematuria: Secondary | ICD-10-CM | POA: Diagnosis present

## 2020-10-01 HISTORY — PX: IR NEPHROSTOMY PLACEMENT LEFT: IMG6063

## 2020-10-01 LAB — COMPREHENSIVE METABOLIC PANEL
ALT: 7 U/L (ref 0–44)
AST: 19 U/L (ref 15–41)
Albumin: 2.7 g/dL — ABNORMAL LOW (ref 3.5–5.0)
Alkaline Phosphatase: 54 U/L (ref 38–126)
Anion gap: 13 (ref 5–15)
BUN: 48 mg/dL — ABNORMAL HIGH (ref 8–23)
CO2: 23 mmol/L (ref 22–32)
Calcium: 8.2 mg/dL — ABNORMAL LOW (ref 8.9–10.3)
Chloride: 104 mmol/L (ref 98–111)
Creatinine, Ser: 3.98 mg/dL — ABNORMAL HIGH (ref 0.61–1.24)
GFR, Estimated: 14 mL/min — ABNORMAL LOW (ref >60)
Glucose, Bld: 184 mg/dL — ABNORMAL HIGH (ref 70–99)
Potassium: 4.2 mmol/L (ref 3.5–5.1)
Sodium: 140 mmol/L (ref 135–145)
Total Bilirubin: 0.7 mg/dL (ref 0.3–1.2)
Total Protein: 6.5 g/dL (ref 6.5–8.1)

## 2020-10-01 LAB — BRAIN NATRIURETIC PEPTIDE: B Natriuretic Peptide: 2334.2 pg/mL — ABNORMAL HIGH (ref 0.0–100.0)

## 2020-10-01 LAB — URINALYSIS, COMPLETE (UACMP) WITH MICROSCOPIC
Bacteria, UA: NONE SEEN
RBC / HPF: >50 RBC/hpf — ABNORMAL HIGH (ref 0–5)
Specific Gravity, Urine: 1.026 (ref 1.005–1.030)
Squamous Epithelial / HPF: NONE SEEN (ref 0–5)
WBC, UA: >50 WBC/hpf — ABNORMAL HIGH (ref 0–5)

## 2020-10-01 LAB — CBC WITH DIFFERENTIAL/PLATELET
Abs Immature Granulocytes: 0.17 10*3/uL — ABNORMAL HIGH (ref 0.00–0.07)
Basophils Absolute: 0 10*3/uL (ref 0.0–0.1)
Basophils Relative: 0 %
Eosinophils Absolute: 0 10*3/uL (ref 0.0–0.5)
Eosinophils Relative: 0 %
HCT: 29 % — ABNORMAL LOW (ref 39.0–52.0)
Hemoglobin: 9.7 g/dL — ABNORMAL LOW (ref 13.0–17.0)
Immature Granulocytes: 1 %
Lymphocytes Relative: 2 %
Lymphs Abs: 0.3 10*3/uL — ABNORMAL LOW (ref 0.7–4.0)
MCH: 30.5 pg (ref 26.0–34.0)
MCHC: 33.4 g/dL (ref 30.0–36.0)
MCV: 91.2 fL (ref 80.0–100.0)
Monocytes Absolute: 1 10*3/uL (ref 0.1–1.0)
Monocytes Relative: 5 %
Neutro Abs: 18 10*3/uL — ABNORMAL HIGH (ref 1.7–7.7)
Neutrophils Relative %: 92 %
Platelets: 237 10*3/uL (ref 150–400)
RBC: 3.18 MIL/uL — ABNORMAL LOW (ref 4.22–5.81)
RDW: 13.5 % (ref 11.5–15.5)
WBC: 19.5 10*3/uL — ABNORMAL HIGH (ref 4.0–10.5)
nRBC: 0 % (ref 0.0–0.2)

## 2020-10-01 LAB — TROPONIN I (HIGH SENSITIVITY)
Troponin I (High Sensitivity): 206 ng/L (ref <18)
Troponin I (High Sensitivity): 235 ng/L (ref <18)
Troponin I (High Sensitivity): 400 ng/L (ref <18)
Troponin I (High Sensitivity): 1511 ng/L (ref <18)

## 2020-10-01 LAB — APTT: aPTT: 41 seconds — ABNORMAL HIGH (ref 24–36)

## 2020-10-01 LAB — LACTIC ACID, PLASMA
Lactic Acid, Venous: 5.1 mmol/L (ref 0.5–1.9)
Lactic Acid, Venous: 3.7 mmol/L (ref 0.5–1.9)

## 2020-10-01 LAB — HEPARIN LEVEL (UNFRACTIONATED): Heparin Unfractionated: 0.17 IU/mL — ABNORMAL LOW (ref 0.30–0.70)

## 2020-10-01 LAB — GLUCOSE, CAPILLARY: Glucose-Capillary: 151 mg/dL — ABNORMAL HIGH (ref 70–99)

## 2020-10-01 LAB — SAMPLE TO BLOOD BANK

## 2020-10-01 LAB — PROCALCITONIN: Procalcitonin: 71.94 ng/mL

## 2020-10-01 LAB — PROTIME-INR
INR: 1.2 (ref 0.8–1.2)
Prothrombin Time: 15.2 seconds (ref 11.4–15.2)

## 2020-10-01 LAB — CBG MONITORING, ED: Glucose-Capillary: 175 mg/dL — ABNORMAL HIGH (ref 70–99)

## 2020-10-01 MED ORDER — DONEPEZIL HCL 5 MG PO TABS
10.0000 mg | ORAL_TABLET | Freq: Every day | ORAL | Status: DC
  Administered 2020-10-01 – 2020-10-04 (×4): 10 mg via ORAL
  Filled 2020-10-01 (×5): qty 2

## 2020-10-01 MED ORDER — OXYCODONE-ACETAMINOPHEN 5-325 MG PO TABS
1.0000 | ORAL_TABLET | ORAL | Status: DC | PRN
Start: 2020-10-01 — End: 2020-10-05
  Administered 2020-10-05 (×2): 1 via ORAL
  Filled 2020-10-01 (×2): qty 1

## 2020-10-01 MED ORDER — MIDAZOLAM HCL 2 MG/2ML IJ SOLN
INTRAMUSCULAR | Status: AC
  Filled 2020-10-01: qty 2

## 2020-10-01 MED ORDER — IPRATROPIUM-ALBUTEROL 0.5-2.5 (3) MG/3ML IN SOLN
3.0000 mL | RESPIRATORY_TRACT | Status: DC
  Administered 2020-10-01 – 2020-10-02 (×2): 3 mL via RESPIRATORY_TRACT
  Filled 2020-10-01 (×3): qty 3

## 2020-10-01 MED ORDER — MORPHINE SULFATE (PF) 2 MG/ML IV SOLN: 0.5000 mg | INTRAVENOUS | Status: DC | PRN

## 2020-10-01 MED ORDER — ESCITALOPRAM OXALATE 10 MG PO TABS
5.0000 mg | ORAL_TABLET | Freq: Every day | ORAL | Status: DC
Start: 2020-10-01 — End: 2020-10-05
  Administered 2020-10-01 – 2020-10-05 (×5): 5 mg via ORAL
  Filled 2020-10-01 (×5): qty 0.5

## 2020-10-01 MED ORDER — LACTATED RINGERS IV SOLN: INTRAVENOUS | Status: DC

## 2020-10-01 MED ORDER — DIVALPROEX SODIUM 250 MG PO DR TAB
250.0000 mg | DELAYED_RELEASE_TABLET | Freq: Two times a day (BID) | ORAL | Status: DC
  Administered 2020-10-01 – 2020-10-06 (×10): 250 mg via ORAL
  Filled 2020-10-01 (×11): qty 1

## 2020-10-01 MED ORDER — HEPARIN BOLUS VIA INFUSION
4000.0000 [IU] | Freq: Once | INTRAVENOUS | Status: DC
  Filled 2020-10-01: qty 4000

## 2020-10-01 MED ORDER — LACTATED RINGERS IV BOLUS (SEPSIS)
1000.0000 mL | Freq: Once | INTRAVENOUS | Status: AC
  Administered 2020-10-01: 1000 mL via INTRAVENOUS

## 2020-10-01 MED ORDER — IODIXANOL 320 MG/ML IV SOLN: 50.0000 mL | Freq: Once | INTRAVENOUS | Status: DC

## 2020-10-01 MED ORDER — DM-GUAIFENESIN ER 30-600 MG PO TB12: 1.0000 | ORAL_TABLET | Freq: Two times a day (BID) | ORAL | Status: DC | PRN

## 2020-10-01 MED ORDER — HEPARIN (PORCINE) 25000 UT/250ML-% IV SOLN
1300.0000 [IU]/h | INTRAVENOUS | Status: DC
  Administered 2020-10-01: 1000 [IU]/h via INTRAVENOUS
  Filled 2020-10-01 (×2): qty 250

## 2020-10-01 MED ORDER — CARBIDOPA-LEVODOPA 25-100 MG PO TABS
1.0000 | ORAL_TABLET | Freq: Three times a day (TID) | ORAL | Status: DC
  Administered 2020-10-01 – 2020-10-05 (×11): 1 via ORAL
  Filled 2020-10-01 (×13): qty 1

## 2020-10-01 MED ORDER — LACTATED RINGERS IV BOLUS
1000.0000 mL | Freq: Once | INTRAVENOUS | Status: AC
  Administered 2020-10-01: 1000 mL via INTRAVENOUS

## 2020-10-01 MED ORDER — ALBUTEROL SULFATE (2.5 MG/3ML) 0.083% IN NEBU: 2.5000 mg | INHALATION_SOLUTION | RESPIRATORY_TRACT | Status: DC | PRN

## 2020-10-01 MED ORDER — ATORVASTATIN CALCIUM 20 MG PO TABS
40.0000 mg | ORAL_TABLET | Freq: Every day | ORAL | Status: DC
  Administered 2020-10-02 – 2020-10-05 (×4): 40 mg via ORAL
  Filled 2020-10-01 (×4): qty 2

## 2020-10-01 MED ORDER — ASPIRIN EC 81 MG PO TBEC
81.0000 mg | DELAYED_RELEASE_TABLET | Freq: Every day | ORAL | Status: DC
  Administered 2020-10-02 – 2020-10-03 (×2): 81 mg via ORAL
  Filled 2020-10-01 (×2): qty 1

## 2020-10-01 MED ORDER — ONDANSETRON HCL 4 MG/2ML IJ SOLN
4.0000 mg | Freq: Four times a day (QID) | INTRAMUSCULAR | Status: DC | PRN
  Administered 2020-10-01: 4 mg via INTRAVENOUS

## 2020-10-01 MED ORDER — INSULIN ASPART 100 UNIT/ML ~~LOC~~ SOLN: 0.0000 [IU] | Freq: Every day | SUBCUTANEOUS | Status: DC

## 2020-10-01 MED ORDER — MORPHINE SULFATE (PF) 2 MG/ML IV SOLN
1.0000 mg | INTRAVENOUS | Status: DC | PRN
Start: 2020-10-01 — End: 2020-10-03
  Filled 2020-10-01: qty 1

## 2020-10-01 MED ORDER — HEPARIN BOLUS VIA INFUSION
2400.0000 [IU] | Freq: Once | INTRAVENOUS | Status: AC
  Administered 2020-10-02: 2400 [IU] via INTRAVENOUS
  Filled 2020-10-01: qty 2400

## 2020-10-01 MED ORDER — FENTANYL CITRATE (PF) 100 MCG/2ML IJ SOLN
INTRAMUSCULAR | Status: AC | PRN
  Administered 2020-10-01: 25 ug via INTRAVENOUS

## 2020-10-01 MED ORDER — INSULIN ASPART 100 UNIT/ML ~~LOC~~ SOLN
0.0000 [IU] | Freq: Three times a day (TID) | SUBCUTANEOUS | Status: DC
  Administered 2020-10-01: 2 [IU] via SUBCUTANEOUS
  Administered 2020-10-02: 1 [IU] via SUBCUTANEOUS
  Administered 2020-10-02: 2 [IU] via SUBCUTANEOUS
  Administered 2020-10-02 – 2020-10-03 (×2): 1 [IU] via SUBCUTANEOUS
  Administered 2020-10-03 (×2): 2 [IU] via SUBCUTANEOUS
  Administered 2020-10-04: 1 [IU] via SUBCUTANEOUS
  Administered 2020-10-04 (×2): 2 [IU] via SUBCUTANEOUS
  Administered 2020-10-05: 3 [IU] via SUBCUTANEOUS
  Administered 2020-10-05: 2 [IU] via SUBCUTANEOUS
  Filled 2020-10-01 (×12): qty 1

## 2020-10-01 MED ORDER — SODIUM CHLORIDE 0.9 % IV SOLN
1.0000 g | INTRAVENOUS | Status: DC
  Administered 2020-10-01: 1 g via INTRAVENOUS
  Filled 2020-10-01 (×2): qty 10

## 2020-10-01 MED ORDER — HEPARIN SODIUM (PORCINE) 5000 UNIT/ML IJ SOLN: 4000.0000 [IU] | Freq: Once | INTRAMUSCULAR | Status: DC

## 2020-10-01 MED ORDER — SODIUM CHLORIDE 0.9% FLUSH
5.0000 mL | Freq: Three times a day (TID) | INTRAVENOUS | Status: DC
  Administered 2020-10-01 – 2020-10-06 (×13): 5 mL

## 2020-10-01 MED ORDER — ONDANSETRON HCL 4 MG/2ML IJ SOLN
INTRAMUSCULAR | Status: AC
  Filled 2020-10-01: qty 2

## 2020-10-01 MED ORDER — FENTANYL CITRATE (PF) 100 MCG/2ML IJ SOLN
INTRAMUSCULAR | Status: AC
  Filled 2020-10-01: qty 2

## 2020-10-01 MED ORDER — LOPERAMIDE HCL 2 MG PO CAPS: 2.0000 mg | ORAL_CAPSULE | ORAL | Status: DC | PRN

## 2020-10-01 NOTE — H&P (Signed)
History and Physical    Ian Jimenez [id number] DOB: Dec 27, 1933 DOA: 10/01/2020  Referring MD/NP/PA:   PCP: Albina Billet, MD   Patient coming from:  The patient is coming from SNF.  At baseline, pt is dependent for most of ADL.        Chief Complaint: AMS, hematuria  HPI: Ian Jimenez is a 87 y.o. male with medical history significant of HTN, HLD, stroke/TIA, dementia, CKD-III, depression, presents with AMS, hematuria.  Per his daughter, at her normal baseline, patient recognizes and talks to her, and is able to walk.  Sometimes patient knows the place, but usually not oriented to the time.  Patient was found to be more confused today. He is not orientated x3.  No facial droop or slurred speech.  Patient has chronic cough which has not changed.  Does not seem to have chest pain, respiratory distress, no nausea, vomiting, diarrhea noted.  Patient has gross blood in urine. Not know if patient has symptoms of UTI.  ED Course: pt was found to have WBC 19.5, lactic acid of 5.1, troponin level 2 6, 235, urinalysis (turbid appearance, no bacteria, WBC> 50), temperature 100.1, blood pressure 119/56, heart rate 102, RR 25, oxygen saturations 88%, which improved to 98% on 2 L oxygen.  CT head is negative for acute intracranial abnormalities.  CT scan showed left-sided hydronephrosis without obstructive stone, suspecting possible bladder mass. Patient is admitted to progressive bed as inpatient.  Patient underwent urgent left nephrostomy tube placement by Dr. Serafina Royals of IR. Dr. Erlene Quan of urology and Dr. Fletcher Anon of card are consulted.   CT-scan of abd/pelvis: Left-sided hydronephrosis is noted without obstructing renal stone. This raises suspicion for possible bladder mass. Additionally hyperdense material is noted within the bladder consistent with thrombus. Direct visualization is recommended for further evaluation.  Nonobstructing lower pole left renal stone.  Stable hypodensity within the  head of the pancreas unchanged from 2019.  Small right renal lesion likely representing an adenoma but incompletely evaluated on this exam.  No other focal abnormality is noted.  Review of Systems: Could not be reviewed due to altered mental status.   Allergy: No Known Allergies  Past Medical History:  Diagnosis Date  . Dementia ([place])   . Diabetes mellitus without complication ([place])   . Hypertension   . Stroke [facility])     Past Surgical History:  Procedure Laterality Date  . IR NEPHROSTOMY PLACEMENT LEFT  10/01/2020  . none      Social History:  reports that he has never smoked. He has never used smokeless tobacco. He reports that he does not drink alcohol and does not use drugs.  Family History:  Family History  Problem Relation Age of Onset  . Heart disease Father      Prior to Admission medications   Medication Sig Start Date End Date Taking? Authorizing Provider  acetaminophen (TYLENOL) 325 MG tablet Take 2 tablets (650 mg total) by mouth every 6 (six) hours as needed for mild pain (or Fever >/= 101). 05/03/18   Salary, Avel Peace, MD  amLODipine (NORVASC) 10 MG tablet Take 1 tablet (10 mg total) by mouth daily. 05/04/18   Salary, Avel Peace, MD  donepezil (ARICEPT) 10 MG tablet Take 10 mg by mouth at bedtime.    [provider]  escitalopram (LEXAPRO) 5 MG tablet Take 5 mg by mouth daily. 03/12/18   [provider]  glipiZIDE (GLUCOTROL) 5 MG tablet Take 1 tablet (5 mg total) by mouth  2 (two) times daily before a meal. 04/11/18   Gladstone Lighter, MD  hydrALAZINE (APRESOLINE) 25 MG tablet Take 25 mg by mouth 2 (two) times daily. 05/13/18   [provider]  metFORMIN (GLUCOPHAGE) 1000 MG tablet Take 1,000 mg by mouth 2 (two) times daily with a meal.    [provider]  metoprolol tartrate (LOPRESSOR) 25 MG tablet Take 1 tablet (25 mg total) by mouth 2 (two) times daily. 05/03/18   Salary, Avel Peace, MD  NON FORMULARY Diet type: Carb Diet     [provider]  omeprazole (PRILOSEC) 40 MG capsule Take 40 mg by mouth daily. 05/07/18   [provider]    Physical Exam: Vitals:   10/01/20 1605 10/01/20 1730 10/01/20 1800 10/01/20 1830  BP: 127/61 (!) 166/71 (!) 148/72 (!) 145/73  Pulse: 80 78 84 83  Resp: '16 20 20 '$ (!) 21  Temp:      TempSrc:      SpO2: 98% 96% 98% 99%  Weight:      Height:       General: Not in acute distress HEENT:       Eyes: PERRL, EOMI, no scleral icterus.       ENT: No discharge from the ears and nose       Neck: No JVD, no bruit, no mass felt. Heme: No neck lymph node enlargement. Cardiac: S1/S2, RRR, No murmurs, No gallops or rubs. Respiratory: No rales, wheezing, rhonchi or rubs. GI: Soft, nondistended, seem to have tenderness her left abdomen, no organomegaly, BS present. GU: No hematuria Ext: No pitting leg edema bilaterally. 1+DP/PT pulse bilaterally. Musculoskeletal: No joint deformities, No joint redness or warmth, no limitation of ROM in spin. Skin: No rashes.  Neuro: confused, not oriented X3, cranial nerves II-XII grossly intact, moves all extremities on painful stimuli Psych: Patient is not psychotic   Labs on Admission: I have personally reviewed following labs and imaging studies  CBC: Recent Labs  Lab 10/01/20 1124  WBC 19.5*  NEUTROABS 18.0*  HGB 9.7*  HCT 29.0*  MCV 91.2  PLT 123XX123   Basic Metabolic Panel: Recent Labs  Lab 10/01/20 1124  NA 140  K 4.2  CL 104  CO2 23  GLUCOSE 184*  BUN 48*  CREATININE 3.98*  CALCIUM 8.2*   GFR: Estimated Creatinine Clearance: 13.8 mL/min (A) (by C-G formula based on SCr of 3.98 mg/dL (H)). Liver Function Tests: Recent Labs  Lab 10/01/20 1124  AST 19  ALT 7  ALKPHOS 54  BILITOT 0.7  PROT 6.5  ALBUMIN 2.7*   No results for input(s): LIPASE, AMYLASE in the last 168 hours. No results for input(s): AMMONIA in the last 168 hours. Coagulation Profile: Recent Labs  Lab 10/01/20 1124  INR 1.2   Cardiac  Enzymes: No results for input(s): CKTOTAL, CKMB, CKMBINDEX, TROPONINI in the last 168 hours. BNP (last 3 results) No results for input(s): PROBNP in the last 8760 hours. HbA1C: No results for input(s): HGBA1C in the last 72 hours. CBG: Recent Labs  Lab 10/01/20 1720  GLUCAP 175*   Lipid Profile: No results for input(s): CHOL, HDL, LDLCALC, TRIG, CHOLHDL, LDLDIRECT in the last 72 hours. Thyroid Function Tests: No results for input(s): TSH, T4TOTAL, FREET4, T3FREE, THYROIDAB in the last 72 hours. Anemia Panel: No results for input(s): VITAMINB12, FOLATE, FERRITIN, TIBC, IRON, RETICCTPCT in the last 72 hours. Urine analysis:    Component Value Date/Time   COLORURINE RED (A) 10/01/2020 1124   APPEARANCEUR TURBID (  A) 10/01/2020 1124   LABSPEC 1.026 10/01/2020 1124   PHURINE  10/01/2020 1124    TEST NOT REPORTED DUE TO COLOR INTERFERENCE OF URINE PIGMENT   GLUCOSEU (A) 10/01/2020 1124    TEST NOT REPORTED DUE TO COLOR INTERFERENCE OF URINE PIGMENT   HGBUR (A) 10/01/2020 1124    TEST NOT REPORTED DUE TO COLOR INTERFERENCE OF URINE PIGMENT   BILIRUBINUR (A) 10/01/2020 1124    TEST NOT REPORTED DUE TO COLOR INTERFERENCE OF URINE PIGMENT   KETONESUR (A) 10/01/2020 1124    TEST NOT REPORTED DUE TO COLOR INTERFERENCE OF URINE PIGMENT   PROTEINUR (A) 10/01/2020 1124    TEST NOT REPORTED DUE TO COLOR INTERFERENCE OF URINE PIGMENT   NITRITE (A) 10/01/2020 1124    TEST NOT REPORTED DUE TO COLOR INTERFERENCE OF URINE PIGMENT   LEUKOCYTESUR (A) 10/01/2020 1124    TEST NOT REPORTED DUE TO COLOR INTERFERENCE OF URINE PIGMENT   Sepsis Labs: '@LABRCNTIP'$ (procalcitonin:4,lacticidven:4) )No results found for this or any previous visit (from the past 240 hour(s)).   Radiological Exams on Admission: CT ABDOMEN PELVIS WO CONTRAST  Result Date: 10/01/2020 CLINICAL DATA:  Abdominal pain and hematuria EXAM: CT ABDOMEN AND PELVIS WITHOUT CONTRAST TECHNIQUE: Multidetector CT imaging of the abdomen and  pelvis was performed following the standard protocol without IV contrast. COMPARISON:  Ultrasound from 07/12/2018 FINDINGS: Lower chest: Small left pleural effusion is noted with mild bibasilar atelectasis. Hepatobiliary: No focal liver abnormality is seen. No gallstones, gallbladder wall thickening, or biliary dilatation. Pancreas: Hypodensity in the region of the head of the pancreas which corresponds to a cystic lesion seen on the prior ultrasound examination. It measures approximately 15 mm. The remainder of the pancreas is within normal limits. Spleen: Normal in size without focal abnormality. Adrenals/Urinary Tract: Left adrenal gland is within normal limits. Right adrenal gland demonstrates a small 15 mm lesion best seen on image number 29 of series 2. Statistically this likely represents an adenoma. Right kidney is well visualized without obstructive change or renal calculi. Left kidney demonstrates evidence of small nonobstructing lower pole renal stone as well as hydronephrosis and hydroureter which extends to the level of the urinary bladder. No obstructing stone is noted. The bladder is decompressed and irregular hyperdense material is noted within the bladder likely representing thrombus given the clinical history. Underlying mass lesion at the bladder trigone may be present given the left-sided hydronephrosis. Stomach/Bowel: No obstructive or inflammatory changes of the colon are seen. The appendix is within normal limits. No small bowel or gastric abnormality is seen. Vascular/Lymphatic: Aortic atherosclerosis. No enlarged abdominal or pelvic lymph nodes. Reproductive: Prostate is unremarkable. Other: No abdominal wall hernia or abnormality. No abdominopelvic ascites. Musculoskeletal: Degenerative changes of lumbar spine are noted. IMPRESSION: Left-sided hydronephrosis is noted without obstructing renal stone. This raises suspicion for possible bladder mass. Additionally hyperdense material is noted  within the bladder consistent with thrombus. Direct visualization is recommended for further evaluation. Nonobstructing lower pole left renal stone. Stable hypodensity within the head of the pancreas unchanged from 2019. Small right renal lesion likely representing an adenoma but incompletely evaluated on this exam. No other focal abnormality is noted. Electronically Signed   By: Inez Catalina M.D.   On: 10/01/2020 13:25   CT Head Wo Contrast  Result Date: 10/01/2020 CLINICAL DATA:  Mental status changes. EXAM: CT HEAD WITHOUT CONTRAST TECHNIQUE: Contiguous axial images were obtained from the base of the skull through the vertex without intravenous contrast. COMPARISON:  06/14/2020 FINDINGS:  Brain: There is no evidence for acute hemorrhage, hydrocephalus, mass lesion, or abnormal extra-axial fluid collection. No definite CT evidence for acute infarction. Diffuse loss of parenchymal volume is consistent with atrophy. Patchy low attenuation in the deep hemispheric and periventricular white matter is nonspecific, but likely reflects chronic microvascular ischemic demyelination. Nonacute left parietooccipital infarct is new since prior. Vascular: No hyperdense vessel or unexpected calcification. Skull: No evidence for fracture. No worrisome lytic or sclerotic lesion. Sinuses/Orbits: The visualized paranasal sinuses and mastoid air cells are clear. Visualized portions of the globes and intraorbital fat are unremarkable. Other: None. IMPRESSION: 1. No acute intracranial abnormality. 2. Nonacute left parietooccipital infarct, new since prior. 3. Atrophy with chronic small vessel white matter ischemic disease. Electronically Signed   By: Misty Stanley M.D.   On: 10/01/2020 13:25   [place] Intraoperative  Result Date: 10/01/2020 CLINICAL DATA:  Ultrasound was provided for use by the ordering physician.  No provider Interpretation or professional fees incurred.    DG Chest Portable 1 View  Result Date:  10/01/2020 CLINICAL DATA:  87 year old male with altered mental status onset this morning. EXAM: PORTABLE CHEST 1 VIEW COMPARISON:  Portable chest 11/25/2018 and earlier. FINDINGS: Portable AP semi upright view at 1137 hours. The patient is more rotated to the left. Possible cardiomegaly which is new since 2020. Other mediastinal contours are within normal limits. Subsequent left lung base hypo ventilation. But elsewhere Allowing for portable technique the lungs are clear. No pneumothorax. No acute osseous abnormality identified. Paucity of bowel gas in the upper abdomen. IMPRESSION: 1. Questionable new or increased cardiomegaly since 2020, associated left lung base atelectasis. 2. No other acute cardiopulmonary abnormality. Electronically Signed   By: Genevie Ann M.D.   On: 10/01/2020 11:56   IR NEPHROSTOMY PLACEMENT LEFT  Result Date: 10/01/2020 INDICATION: 87 year old male presenting with presumed urinary sepsis and non ST elevated myocardial infarction, bladder mass suspected. EXAM: 1. ULTRASOUND GUIDANCE FOR PUNCTURE OF THE LEFT RENAL COLLECTING SYSTEM 2. LEFT PERCUTANEOUS NEPHROSTOMY TUBE PLACEMENT. COMPARISON:  None. MEDICATIONS: The patient is receiving appropriate inpatient antibiotic coverage prior to procedure. ANESTHESIA/SEDATION: Moderate (conscious) sedation was employed during this procedure. A total of Versed 0 mg and Fentanyl 25 mcg was administered intravenously. Moderate Sedation Time: 0 minutes. The patient's level of consciousness and vital signs were monitored continuously by radiology nursing throughout the procedure under my direct supervision. CONTRAST:  Twenty mL Isovue 300 - administered into the renal collecting system FLUOROSCOPY TIME:  0.8 minutes, 123456 mGy COMPLICATIONS: None immediate. PROCEDURE: The procedure, risks, benefits, and alternatives were explained to the patient. Questions regarding the procedure were encouraged and answered. The patient understands and consents to the  procedure. A timeout was performed prior to the initiation of the procedure. The left flank region was prepped and draped in the usual sterile fashion and a sterile drape was applied covering the operative field. A sterile gown and sterile gloves were used for the procedure. Local anesthesia was provided with 1% Lidocaine. Ultrasound was used to localize the left kidney. Under direct ultrasound guidance, a 20 gauge needle was advanced into the renal collecting system. An ultrasound image documentation was performed. Access within the collecting system was confirmed with the efflux of urine followed by limited contrast injection. Over a Nitrex wire, the tract was dilated with an Accustick stent. Next, under intermittent fluoroscpic guidance and over a short Amplatz wire, the track was dilated ultimately allowing placement of a 10.2 percutaneous nephrostomy catheter which was advanced to the level  of the renal pelvis where the coil was formed and locked. Contrast was injected and several spot fluoroscopic images were obtained in various obliquities. The catheter was secured at the skin with a 0 silk retention suture and stat lock device and connected to a gravity bag was placed. Dressings were applied. The patient tolerated procedure well without immediate postprocedural complication. FINDINGS: Ultrasound scanning demonstrates a moderately dilated left collecting system. Under a combination of ultrasound and fluoroscopic guidance, a posterior inferior calix was targeted allowing placement of a 10.2 percutaneous nephrostomy catheter with end coiled and locked within the renal pelvis. Contrast injection confirmed appropriate positioning. Contrast does not travel beyond the mid ureter. IMPRESSION: Successful ultrasound and fluoroscopic guided placement of a left sided 10.2 [place] PCN. PLAN: Interventional Radiology will arrange for routine check and exchange of indwelling nephrostomy in 4-6 weeks. Ruthann Cancer, MD  [facility] Radiology Electronically Signed   By: Ruthann Cancer MD   On: 10/01/2020 16:21     EKG: I have personally reviewed.  Sinus rhythm, QTC 578, low voltage, left bundle blockage, anteroseptal infarction pattern, poor R wave progression.  Assessment/Plan Principal Problem:   Acute pyelonephritis Active Problems:   TIA (transient ischemic attack)   Hypertension associated with diabetes ([place])   Depression due to dementia ([place])   GERD without esophagitis   NSTEMI (non-ST elevated myocardial infarction) ([place])   Type II diabetes mellitus with renal manifestations ([place])   Acute renal failure superimposed on stage 3a chronic kidney disease ([facility])   HLD (hyperlipidemia)   Dementia ([facility])   Hydronephrosis of left kidney   Acute respiratory failure with hypoxia ([facility])   Severe sepsis with septic shock due to acute pyelonephritis 2/2 obstructive uropathy and hydronephrosis of left kidney due to possible bladder mass: Urology, Dr. Erlene Quan is consulted.  Dr. Serafina Royals of IR did urgent left nephrostomy tube placement.  Patient admits critical for severe sepsis with septic shock with lactic acid of 5.1, leukocytosis with WBC 19.5, tachycardia with heart rate of 102, tachypnea with RR 25, fever.  Currently hemodynamically stable  -Admitted to progressive bed as inpatient -IV Rocephin -Follow-up blood culture and urine culture -will get Procalcitonin and trend lactic acid levels per sepsis protocol. -IVF: 2L of LR bolus in ED, followed by 75 cc/h   TIA (transient ischemic attack) -hold plavix due to surgery  Hypertension associated with diabetes ([place]) -Hold blood pressure medications since patient is at high risk of developing hypotension  Depression due to dementia ([place]) -Continue home medications  NSTEMI (non-ST elevated myocardial infarction) [facility]): Troponin II 6, 235, has new left bundle blockage, Dr. Fletcher Anon of cardiology is consulted, who  recommended to get a 2D echo -Start IV heparin -Aspirin -Lipitor 40 mg daily -Trend troponin -Check A1c, FLP -2D echo  Type II diabetes mellitus with renal manifestations ([place]): A1c 6.4 recently, well controlled.  Patient is taking Metformin glipizide -Sliding scale insulin  Acute renal failure superimposed on stage 3a chronic kidney disease ([place]): Most likely due to obstructive uropathy.  Left nephrostomy tube is placed with -Follow-up by BMP  HLD (hyperlipidemia) -Started Lipitor  Dementia ([place]) -Continue donepezil  Parkinson's disease: -Sinemet  Acute respiratory failure with hypoxia [facility]): Patient has oxygen desaturation to 88%, but does not have symptoms.  Etiology is not clear.  His chest x-ray did not show infiltration.  May be related to non-STEMI -Check BNP -Bronchodilators -Nasal cannula oxygen to maintain oxygen saturation above 93%    DVT ppx: IV Heparin  Code Status: partial code: No CPR and intubation, but OK with vasopressor per his daughter Family Communication: Yes, patient's daughter   at bed side Disposition Plan:  Anticipate discharge back to previous environment Consults called:  Dr. Erlene Quan of urology and Dr. Darrick Huntsman of IR, Dr. Fletcher Anon of card Admission status and Level of care: Progressive Cardiac:   as inpt    Status is: Inpatient  Remains inpatient appropriate because:Inpatient level of care appropriate due to severity of illness   Dispo: The patient is from: SNF              Anticipated d/c is to: SNF              Patient currently is not medically stable to d/c.   Difficult to place patient No           Date of Service 10/01/2020    [facility]   If 7PM-7AM, please contact night-coverage [email or web address] 10/01/2020, 7:33 PM

## 2020-10-01 NOTE — Progress Notes (Signed)
*  PRELIMINARY RESULTS* Echocardiogram 2D Echocardiogram has been performed.  Ian Jimenez 10/01/2020, 8:07 PM

## 2020-10-01 NOTE — Procedures (Signed)
Interventional Radiology Procedure Note  Procedure: Left nephrostomy tube placement  Findings: Please refer to procedural dictation for full description. 10.2 Fr left nephrostomy tube placement via posteroinferior calyx.  Cloudy urine aspirate sent for culture.    Complications: None immediate  Estimated Blood Loss: < 5 mL  Recommendations: Keep to bag drainage. OK to initiate anticoagulation immediately.  Expect some hematuria.  If gross hematuria with associated anemia please notify IR. Follow up urine culture. IR will follow.   Ruthann Cancer, MD Pager: [phone]

## 2020-10-01 NOTE — ED Notes (Signed)
Contacted lab to draw bloodwork. Difficult stick.

## 2020-10-01 NOTE — ED Notes (Signed)
Ian Jimenez from specials contacted to receive report. Advised to hold heparin drip.

## 2020-10-01 NOTE — ED Triage Notes (Signed)
Pt via EMS from [place] [facility] [place]. Per staff, pt has not been acting himself since this morning. Pt is usually alert, walking and talking but when he woke up around 10:00 this morning and was not trying to get out of bed. Pt has a hx of Parkinson and Dementia. Pt is alert but responsive to painful and verbal stimuli.

## 2020-10-01 NOTE — Consult Note (Signed)
Chief Complaint: Patient was seen in consultation today for  Chief Complaint  Patient presents with  . Altered Mental Status    Referring Physician(s): ED physician  Supervising Physician: Ruthann Cancer  Patient Status: [facility] - ED  History of Present Illness: Ian Jimenez is a 87 y.o. male with a medical history significant for DM, HTN, dementia and a stroke, on anticoagulation. He presented to the [facility] ED 10/01/20 from his [facility]F for altered mental status and was found to have left hydronephosis with associated urosepsis. Labs were notable for a WBC of 19.5, lactic acid 5.1, BUN/Cr 48/3.98.   CT abdomen/pelvis 10/01/20  IMPRESSION: 1. Left-sided hydronephrosis is noted without obstructing renal stone. This raises suspicion for possible bladder mass. Additionally hyperdense material is noted within the bladder consistent with thrombus. Direct visualization is recommended for further evaluation. 2. Nonobstructing lower pole left renal stone. 3. Stable hypodensity within the head of the pancreas unchanged from 2019. 4. Small right renal lesion likely representing an adenoma but incompletely evaluated on this exam. 5. No other focal abnormality is noted.  Interventional Radiology has been asked to evaluate this patient for an image-guided left percutaneous nephrostomy tube. This case has been reviewed and procedure approved by Dr. Serafina Royals.   Past Medical History:  Diagnosis Date  . Dementia ([place])   . Diabetes mellitus without complication ([place])   . Hypertension   . Stroke [facility])     Past Surgical History:  Procedure Laterality Date  . none      Allergies: Patient has no known allergies.  Medications: Prior to Admission medications   Medication Sig Start Date End Date Taking? Authorizing Provider  amLODipine (NORVASC) 10 MG tablet Take 1 tablet (10 mg total) by mouth daily. 05/04/18  Yes Salary, Holly Bodily D, MD  carbidopa-levodopa (SINEMET IR) 25-100 MG tablet Take 1 tablet  by mouth 3 (three) times daily. 09/01/20  Yes [provider]  clopidogrel (PLAVIX) 75 MG tablet Take 75 mg by mouth daily. 09/22/20  Yes [provider]  divalproex (DEPAKOTE) 250 MG DR tablet Take 250 mg by mouth 2 (two) times daily. 09/22/20  Yes [provider]  donepezil (ARICEPT) 10 MG tablet Take 10 mg by mouth at bedtime.   Yes [provider]  escitalopram (LEXAPRO) 5 MG tablet Take 5 mg by mouth daily. 03/12/18  Yes [provider]  glipiZIDE (GLUCOTROL) 5 MG tablet Take 1 tablet (5 mg total) by mouth 2 (two) times daily before a meal. 04/11/18  Yes Gladstone Lighter, MD  hydrALAZINE (APRESOLINE) 25 MG tablet Take 25 mg by mouth 2 (two) times daily. 05/13/18  Yes [provider]  metFORMIN (GLUCOPHAGE) 1000 MG tablet Take 500 mg by mouth at bedtime. At supper   Yes [provider]  metoprolol tartrate (LOPRESSOR) 25 MG tablet Take 1 tablet (25 mg total) by mouth 2 (two) times daily. 05/03/18  Yes Salary, Avel Peace, MD  acetaminophen (TYLENOL) 325 MG tablet Take 2 tablets (650 mg total) by mouth every 6 (six) hours as needed for mild pain (or Fever >/= 101). 05/03/18   Salary, Avel Peace, MD  NON FORMULARY Diet type: Carb Diet    [provider]  omeprazole (PRILOSEC) 40 MG capsule Take 40 mg by mouth daily. Patient not taking: Reported on 10/01/2020 05/07/18   [provider]     Family History  Problem Relation Age of Onset  . Heart disease Father     Social History   Socioeconomic History  .  Marital status: Married    Spouse name: Not on file  . Number of children: Not on file  . Years of education: Not on file  . Highest education level: Not on file  Occupational History  . Occupation: retired  Tobacco Use  . Smoking status: Never Smoker  . Smokeless tobacco: Never Used  Vaping Use  . Vaping Use: Never used  Substance and Sexual Activity  . Alcohol use: Never  . Drug use: Never  . Sexual activity:  Not on file  Other Topics Concern  . Not on file  Social History Narrative  . Not on file   Social Determinants of Health   Financial Resource Strain: Not on file  Food Insecurity: Not on file  Transportation Needs: Not on file  Physical Activity: Not on file  Stress: Not on file  Social Connections: Not on file    Review of Systems: A 12 point ROS discussed and pertinent positives are indicated in the HPI above.  All other systems are negative.  Review of Systems  Unable to perform ROS: Dementia    Vital Signs: BP (!) 119/56   Pulse 78   Temp 100.1 F (37.8 C) (Rectal)   Resp 17   Ht '5\' 9"'$  (1.753 m)   Wt 180 lb (81.6 kg)   SpO2 (!) 88%   BMI 26.58 kg/m   Physical Exam Constitutional:      General: He is in acute distress.     Appearance: He is ill-appearing.     Comments: Minimally responsive to verbal stimuli; patient is non-verbal. Eyes are open.   HENT:     Mouth/Throat:     Mouth: Mucous membranes are dry.     Pharynx: Oropharynx is clear.  Cardiovascular:     Rate and Rhythm: Regular rhythm. Tachycardia present.     Pulses: Normal pulses.     Heart sounds: Normal heart sounds.  Pulmonary:     Effort: Pulmonary effort is normal.     Breath sounds: Normal breath sounds.  Abdominal:     General: Bowel sounds are normal.     Palpations: Abdomen is soft.  Skin:    General: Skin is warm and dry.  Neurological:     Mental Status: He is disoriented.     Imaging: CT ABDOMEN PELVIS WO CONTRAST  Result Date: 10/01/2020 CLINICAL DATA:  Abdominal pain and hematuria EXAM: CT ABDOMEN AND PELVIS WITHOUT CONTRAST TECHNIQUE: Multidetector CT imaging of the abdomen and pelvis was performed following the standard protocol without IV contrast. COMPARISON:  Ultrasound from 07/12/2018 FINDINGS: Lower chest: Small left pleural effusion is noted with mild bibasilar atelectasis. Hepatobiliary: No focal liver abnormality is seen. No gallstones, gallbladder wall thickening,  or biliary dilatation. Pancreas: Hypodensity in the region of the head of the pancreas which corresponds to a cystic lesion seen on the prior ultrasound examination. It measures approximately 15 mm. The remainder of the pancreas is within normal limits. Spleen: Normal in size without focal abnormality. Adrenals/Urinary Tract: Left adrenal gland is within normal limits. Right adrenal gland demonstrates a small 15 mm lesion best seen on image number 29 of series 2. Statistically this likely represents an adenoma. Right kidney is well visualized without obstructive change or renal calculi. Left kidney demonstrates evidence of small nonobstructing lower pole renal stone as well as hydronephrosis and hydroureter which extends to the level of the urinary bladder. No obstructing stone is noted. The bladder is decompressed and irregular hyperdense material is noted within the  bladder likely representing thrombus given the clinical history. Underlying mass lesion at the bladder trigone may be present given the left-sided hydronephrosis. Stomach/Bowel: No obstructive or inflammatory changes of the colon are seen. The appendix is within normal limits. No small bowel or gastric abnormality is seen. Vascular/Lymphatic: Aortic atherosclerosis. No enlarged abdominal or pelvic lymph nodes. Reproductive: Prostate is unremarkable. Other: No abdominal wall hernia or abnormality. No abdominopelvic ascites. Musculoskeletal: Degenerative changes of lumbar spine are noted. IMPRESSION: Left-sided hydronephrosis is noted without obstructing renal stone. This raises suspicion for possible bladder mass. Additionally hyperdense material is noted within the bladder consistent with thrombus. Direct visualization is recommended for further evaluation. Nonobstructing lower pole left renal stone. Stable hypodensity within the head of the pancreas unchanged from 2019. Small right renal lesion likely representing an adenoma but incompletely evaluated  on this exam. No other focal abnormality is noted. Electronically Signed   By: Inez Catalina M.D.   On: 10/01/2020 13:25   CT Head Wo Contrast  Result Date: 10/01/2020 CLINICAL DATA:  Mental status changes. EXAM: CT HEAD WITHOUT CONTRAST TECHNIQUE: Contiguous axial images were obtained from the base of the skull through the vertex without intravenous contrast. COMPARISON:  06/14/2020 FINDINGS: Brain: There is no evidence for acute hemorrhage, hydrocephalus, mass lesion, or abnormal extra-axial fluid collection. No definite CT evidence for acute infarction. Diffuse loss of parenchymal volume is consistent with atrophy. Patchy low attenuation in the deep hemispheric and periventricular white matter is nonspecific, but likely reflects chronic microvascular ischemic demyelination. Nonacute left parietooccipital infarct is new since prior. Vascular: No hyperdense vessel or unexpected calcification. Skull: No evidence for fracture. No worrisome lytic or sclerotic lesion. Sinuses/Orbits: The visualized paranasal sinuses and mastoid air cells are clear. Visualized portions of the globes and intraorbital fat are unremarkable. Other: None. IMPRESSION: 1. No acute intracranial abnormality. 2. Nonacute left parietooccipital infarct, new since prior. 3. Atrophy with chronic small vessel white matter ischemic disease. Electronically Signed   By: Misty Stanley M.D.   On: 10/01/2020 13:25   DG Chest Portable 1 View  Result Date: 10/01/2020 CLINICAL DATA:  87 year old male with altered mental status onset this morning. EXAM: PORTABLE CHEST 1 VIEW COMPARISON:  Portable chest 11/25/2018 and earlier. FINDINGS: Portable AP semi upright view at 1137 hours. The patient is more rotated to the left. Possible cardiomegaly which is new since 2020. Other mediastinal contours are within normal limits. Subsequent left lung base hypo ventilation. But elsewhere Allowing for portable technique the lungs are clear. No pneumothorax. No acute  osseous abnormality identified. Paucity of bowel gas in the upper abdomen. IMPRESSION: 1. Questionable new or increased cardiomegaly since 2020, associated left lung base atelectasis. 2. No other acute cardiopulmonary abnormality. Electronically Signed   By: Genevie Ann M.D.   On: 10/01/2020 11:56    Labs:  CBC: Recent Labs    10/01/20 1124  WBC 19.5*  HGB 9.7*  HCT 29.0*  PLT 237    COAGS: No results for input(s): INR, APTT in the last 8760 hours.  BMP: Recent Labs    08/28/20 1100 10/01/20 1124  NA 140 140  K 3.8 4.2  CL 104 104  CO2 24 23  GLUCOSE 147* 184*  BUN 35* 48*  CALCIUM 8.4* 8.2*  CREATININE 2.29* 3.98*  GFRNONAA 27* 14*    LIVER FUNCTION TESTS: Recent Labs    08/28/20 1100 10/01/20 1124  BILITOT 0.4 0.7  AST 15 19  ALT 12 7  ALKPHOS 59 54  PROT 6.4*  6.5  ALBUMIN 3.0* 2.7*    TUMOR MARKERS: No results for input(s): AFPTM, CEA, CA199, CHROMGRNA in the last 8760 hours.  Assessment and Plan:  Left hydronephrosis secondary to obstruction: Ian Jimenez, 87 year old male, presents today to the [facility] Interventional Radiology department for an image-guided left percutaneous nephrostomy tube. The patient was assessed in the ED with his daughter at the bedside. Consent was obtained from his daughter, Marilynne Halsted, who is his power of attorney.   Risks and benefits of left PCN placement were discussed with the patient including, but not limited to, infection, bleeding, significant bleeding causing loss or decrease in renal function or damage to adjacent structures.   All of the patient's questions were answered, patient is agreeable to proceed.  Consent signed and in IR. He has been NPO. Labs and vitals have been reviewed. His last dose of Eliquis was Friday, 09/28/20  Thank you for this interesting consult.  I greatly enjoyed meeting Ian Jimenez and look forward to participating in their care.  A copy of this report was sent to  the requesting provider on this date.  Electronically Signed: Soyla Dryer, AGACNP-BC [phone] 10/01/2020, 2:31 PM   I spent a total of 20 Minutes    in face to face in clinical consultation, greater than 50% of which was counseling/coordinating care for left percutaneous nephrostomy tube

## 2020-10-01 NOTE — Consult Note (Addendum)
ANTICOAGULATION CONSULT NOTE - Consult  Pharmacy Consult for heparin Indication: chest pain/ACS (NSTEMI)  No Known Allergies  Patient Measurements: Height: '5\' 9"'$  (175.3 cm) Weight: 81.6 kg (180 lb) IBW/kg (Calculated) : 70.7 Heparin Dosing Weight: 81.6 kg  Vital Signs: Temp: 100.1 F (37.8 C) (02/28 1143) Temp Source: Rectal (02/28 1143) BP: 119/56 (02/28 1330) Pulse Rate: 78 (02/28 1330)  Labs: Recent Labs    10/01/20 1124 10/01/20 1246  HGB 9.7*  --   HCT 29.0*  --   PLT 237  --   CREATININE 3.98*  --   TROPONINIHS 206* 235*    Estimated Creatinine Clearance: 13.8 mL/min (A) (by C-G formula based on SCr of 3.98 mg/dL (H)).   Medications:  PTA: Plavix. No other AC or APT PTA per fill history or care everywhere.    Assessment: 87 yo Male with PMH sig for HTN, HLD, stroke/TIA, vasc dementia, CKD3, MDD presenting with AMS. ISO EKG changes and troponin trending upward pharmacy has been consulted for the management of hep gtt. Patient underwent IR procedure designated as HIGH BLEEDING risk. Per radiology note: okay to being anticoagulation immediately.   Baseline: Hgb 9.7 Hct 29 Plt237 Trop: 206>235 PT/INR: ordered/pending APTT: ordered/pending  Date Time HL Rate/Comment   Goal of Therapy:  Heparin level 0.3-0.7 units/ml Monitor platelets by anticoagulation protocol: Yes   Plan:  Due to recent IR procedure deemed HIGH bleeding risk, will withhold bolus Start heparin infusion at 1000 units/hr (~12u/k/h) Check anti-Xa level in 8 hours and daily while on heparin Continue to monitor H&H and platelets  Dorothe Pea, PharmD, BCPS Clinical Pharmacist  10/01/2020,2:05 PM

## 2020-10-01 NOTE — Consult Note (Addendum)
Urology Consult  I have been asked to see the patient by Dr. Blaine Hamper, for evaluation and management of sepsis of likely urinary source with obstructive uropathy secondary to possible mass.  Chief Complaint: AMS, gross hematuria with clots  History of Present Illness: Ian Jimenez is a 87 y.o. year old male with a history of diabetes, stroke on Plavix, Parkinson's disease, and dementia who presented to the ED from memory care unit at [facility] this morning with reports of a 1 week history of gross hematuria with clots and altered mental status.  Admission labs notable for lactate 5.1; creatinine 3.98 (baseline 1.4-1.5); WBC count 19.5; hemoglobin 9.7 (13.9 one year ago); UA red with >50 RBCs/hpf, >50 WBCs/hpf, and no bacteria; rising troponin 206 to 235; and BNP 2334.2.  He was found to be borderline febrile and normotensive.  CT AP without contrast revealed left hydroureteronephrosis to the level of the bladder without obstructing stone concerning for bladder mass.  There is hyperdense material in the posterior bladder consistent with thrombus versus bladder mass.  Additionally, he has nonobstructing left renal stones and a small right renal lesion, incompletely evaluated in the absence of IV contrast.  He is accompanied today by his daughter who is his primary historian.  He person lives in a memory care unit and is normally much more lucid than today however over the past several days, his mental status has been declining.  He is not able to contribute to his history today.  His daughter reports that he is incontinent at baseline.  Several days ago, she was called by his facility indicating that he is passing small clots.  This seemed to have resolved but his mental status acutely worsened today leading to the aforementioned presentation and admission.  His daughter notes today that he has a personal history of prostate cancer diagnosed about 20 years ago.  She believes he had radiation.   No personal history of smoking but his wife smokes indoors so he has a significant secondhand smoking exposure history.  Anti-infectives (From admission, onward)    Start     Dose/Rate Route Frequency Ordered Stop   10/01/20 1215  cefTRIAXone (ROCEPHIN) 1 g in sodium chloride 0.9 % 100 mL IVPB        1 g 200 mL/hr over 30 Minutes Intravenous Every 24 hours 10/01/20 1206         Past Medical History:  Diagnosis Date   Dementia ([place])    Diabetes mellitus without complication ([place])    Hypertension    Stroke [facility])     Past Surgical History:  Procedure Laterality Date   none      Home Medications:  Current Meds  Medication Sig   amLODipine (NORVASC) 10 MG tablet Take 1 tablet (10 mg total) by mouth daily.   carbidopa-levodopa (SINEMET IR) 25-100 MG tablet Take 1 tablet by mouth 3 (three) times daily.   clopidogrel (PLAVIX) 75 MG tablet Take 75 mg by mouth daily.   divalproex (DEPAKOTE) 250 MG DR tablet Take 250 mg by mouth 2 (two) times daily.   donepezil (ARICEPT) 10 MG tablet Take 10 mg by mouth at bedtime.   escitalopram (LEXAPRO) 5 MG tablet Take 5 mg by mouth daily.   glipiZIDE (GLUCOTROL) 5 MG tablet Take 1 tablet (5 mg total) by mouth 2 (two) times daily before a meal.   hydrALAZINE (APRESOLINE) 25 MG tablet Take 25 mg by mouth 2 (two) times daily.   loperamide (IMODIUM) 2 MG  capsule Take 2 mg by mouth as needed for diarrhea or loose stools. 1 capsule mon, wed, fri, for excessive large bowel movments   metFORMIN (GLUCOPHAGE) 1000 MG tablet Take 500 mg by mouth at bedtime. At supper   metoprolol tartrate (LOPRESSOR) 25 MG tablet Take 1 tablet (25 mg total) by mouth 2 (two) times daily.    Allergies: No Known Allergies  Family History  Problem Relation Age of Onset   Heart disease Father     Social History:  reports that he has never smoked. He has never used smokeless tobacco. He reports that he does not drink alcohol and does not use drugs.  ROS: A complete review  of systems was performed.  All systems are negative except for pertinent findings as noted.  Physical Exam:  Vital signs in last 24 hours: Temp:  [100.1 F (37.8 C)] 100.1 F (37.8 C) (02/28 1143) Pulse Rate:  [78-102] 78 (02/28 1330) Resp:  [16-25] 17 (02/28 1330) BP: (107-128)/(56-64) 119/56 (02/28 1330) SpO2:  [88 %-97 %] 88 % (02/28 1330) Weight:  [81.6 kg] 81.6 kg (02/28 1118) Constitutional: Awake but not alert or oriented HEENT: [place] AT, moist mucus membranes.  Trachea midline, no masses GI: Abdomen is soft, nontender, nondistended, no abdominal masses GU: Left nephrostomy tube in place draining light pink urine Neurologic: Grossly intact, no focal deficits, moving all 4 extremities Psychiatric: Normal mood and affect   Laboratory Data:  Recent Labs    10/01/20 1124  WBC 19.5*  HGB 9.7*  HCT 29.0*   Recent Labs    10/01/20 1124  NA 140  K 4.2  CL 104  CO2 23  GLUCOSE 184*  BUN 48*  CREATININE 3.98*  CALCIUM 8.2*   Recent Labs    10/01/20 1124  INR 1.2   No results for input(s): LABURIN in the last 72 hours. Urinalysis    Component Value Date/Time   COLORURINE RED (A) 10/01/2020 1124   APPEARANCEUR TURBID (A) 10/01/2020 1124   LABSPEC 1.026 10/01/2020 1124   PHURINE  10/01/2020 1124    TEST NOT REPORTED DUE TO COLOR INTERFERENCE OF URINE PIGMENT   GLUCOSEU (A) 10/01/2020 1124    TEST NOT REPORTED DUE TO COLOR INTERFERENCE OF URINE PIGMENT   HGBUR (A) 10/01/2020 1124    TEST NOT REPORTED DUE TO COLOR INTERFERENCE OF URINE PIGMENT   BILIRUBINUR (A) 10/01/2020 1124    TEST NOT REPORTED DUE TO COLOR INTERFERENCE OF URINE PIGMENT   KETONESUR (A) 10/01/2020 1124    TEST NOT REPORTED DUE TO COLOR INTERFERENCE OF URINE PIGMENT   PROTEINUR (A) 10/01/2020 1124    TEST NOT REPORTED DUE TO COLOR INTERFERENCE OF URINE PIGMENT   NITRITE (A) 10/01/2020 1124    TEST NOT REPORTED DUE TO COLOR INTERFERENCE OF URINE PIGMENT   LEUKOCYTESUR (A) 10/01/2020 1124     TEST NOT REPORTED DUE TO COLOR INTERFERENCE OF URINE PIGMENT   No results found for this or any previous visit.  Radiologic Imaging: CT ABDOMEN PELVIS WO CONTRAST  Result Date: 10/01/2020 CLINICAL DATA:  Abdominal pain and hematuria EXAM: CT ABDOMEN AND PELVIS WITHOUT CONTRAST TECHNIQUE: Multidetector CT imaging of the abdomen and pelvis was performed following the standard protocol without IV contrast. COMPARISON:  Ultrasound from 07/12/2018 FINDINGS: Lower chest: Small left pleural effusion is noted with mild bibasilar atelectasis. Hepatobiliary: No focal liver abnormality is seen. No gallstones, gallbladder wall thickening, or biliary dilatation. Pancreas: Hypodensity in the region of the head of the pancreas which  corresponds to a cystic lesion seen on the prior ultrasound examination. It measures approximately 15 mm. The remainder of the pancreas is within normal limits. Spleen: Normal in size without focal abnormality. Adrenals/Urinary Tract: Left adrenal gland is within normal limits. Right adrenal gland demonstrates a small 15 mm lesion best seen on image number 29 of series 2. Statistically this likely represents an adenoma. Right kidney is well visualized without obstructive change or renal calculi. Left kidney demonstrates evidence of small nonobstructing lower pole renal stone as well as hydronephrosis and hydroureter which extends to the level of the urinary bladder. No obstructing stone is noted. The bladder is decompressed and irregular hyperdense material is noted within the bladder likely representing thrombus given the clinical history. Underlying mass lesion at the bladder trigone may be present given the left-sided hydronephrosis. Stomach/Bowel: No obstructive or inflammatory changes of the colon are seen. The appendix is within normal limits. No small bowel or gastric abnormality is seen. Vascular/Lymphatic: Aortic atherosclerosis. No enlarged abdominal or pelvic lymph nodes.  Reproductive: Prostate is unremarkable. Other: No abdominal wall hernia or abnormality. No abdominopelvic ascites. Musculoskeletal: Degenerative changes of lumbar spine are noted. IMPRESSION: Left-sided hydronephrosis is noted without obstructing renal stone. This raises suspicion for possible bladder mass. Additionally hyperdense material is noted within the bladder consistent with thrombus. Direct visualization is recommended for further evaluation. Nonobstructing lower pole left renal stone. Stable hypodensity within the head of the pancreas unchanged from 2019. Small right renal lesion likely representing an adenoma but incompletely evaluated on this exam. No other focal abnormality is noted. Electronically Signed   By: Inez Catalina M.D.   On: 10/01/2020 13:25   CT Head Wo Contrast  Result Date: 10/01/2020 CLINICAL DATA:  Mental status changes. EXAM: CT HEAD WITHOUT CONTRAST TECHNIQUE: Contiguous axial images were obtained from the base of the skull through the vertex without intravenous contrast. COMPARISON:  06/14/2020 FINDINGS: Brain: There is no evidence for acute hemorrhage, hydrocephalus, mass lesion, or abnormal extra-axial fluid collection. No definite CT evidence for acute infarction. Diffuse loss of parenchymal volume is consistent with atrophy. Patchy low attenuation in the deep hemispheric and periventricular white matter is nonspecific, but likely reflects chronic microvascular ischemic demyelination. Nonacute left parietooccipital infarct is new since prior. Vascular: No hyperdense vessel or unexpected calcification. Skull: No evidence for fracture. No worrisome lytic or sclerotic lesion. Sinuses/Orbits: The visualized paranasal sinuses and mastoid air cells are clear. Visualized portions of the globes and intraorbital fat are unremarkable. Other: None. IMPRESSION: 1. No acute intracranial abnormality. 2. Nonacute left parietooccipital infarct, new since prior. 3. Atrophy with chronic small  vessel white matter ischemic disease. Electronically Signed   By: Misty Stanley M.D.   On: 10/01/2020 13:25   DG Chest Portable 1 View  Result Date: 10/01/2020 CLINICAL DATA:  87 year old male with altered mental status onset this morning. EXAM: PORTABLE CHEST 1 VIEW COMPARISON:  Portable chest 11/25/2018 and earlier. FINDINGS: Portable AP semi upright view at 1137 hours. The patient is more rotated to the left. Possible cardiomegaly which is new since 2020. Other mediastinal contours are within normal limits. Subsequent left lung base hypo ventilation. But elsewhere Allowing for portable technique the lungs are clear. No pneumothorax. No acute osseous abnormality identified. Paucity of bowel gas in the upper abdomen. IMPRESSION: 1. Questionable new or increased cardiomegaly since 2020, associated left lung base atelectasis. 2. No other acute cardiopulmonary abnormality. Electronically Signed   By: Genevie Ann M.D.   On: 10/01/2020 11:56  CT scan was personally reviewed.  Agree with radiologic interpretation.  Assessment & Plan:   87 year old male admitted with sepsis of urinary source, left-sided hydronephrosis possibly secondary to obstructing mass with multiorgan dysfunction.  1.  Left hydronephrosis-possibly related to obstructing bladder tumor although additional etiology including blood clot or external compression not able to be ruled out with the above study.  In the setting of sepsis of urinary source, significantly elevated lactate, recommended left nephrostomy tube placement for source control.  This was placed earlier today upon her recommendation and appears to be draining well.  He subsequently been started on his heparin drip.  2.  Sepsis of urinary source-supportive care and IV antibiotics.  Adjust antibiotics based on urine/blood culture and sensitivity data.  3.  Bladder clot versus bladder tumor-discussed differential diagnosis with his daughter.  Despite having this mass/ clot in the  bladder, he does appear to be emptying reasonably well and thus will hold off on placing a catheter at this time.  We discussed ultimate plan for further evaluation of this which may include clot evacuation/TURBT versus outpatient cystoscopy.  We discussed briefly the risk and benefits of both.  At this point, we will continue supportive care and see if he clinically improves.  We will decide on how to manage the aforementioned issue as well as further diagnose treat based on how he does and further goals of care discussion.  We will approach this on a day-to-day basis for the time being.  We also plan to send a urine cytology today.  4.  Acute on chronic renal insufficiency-likely multifactorial including obstruction as well as prerenal.  IV hydration, nephrostomy tube and supportive care.  Trend creatinine.  5.  History of prostate cancer-we will send PSA with a.m. labs to rule this out as the source of his underlying bladder mass although less likely.  6.  Gross hematuria-multifactorial.  Hold off on cystoscopy/Foley catheter/bladder irrigation at this time but low threshold to intervene especially since he is being started on heparin drip.  Will follow closely.  Thank you for involving me in this patient's care, I will continue to follow along.  Debroah Loop, PA-C 10/01/2020 3:23 PM    Hollice Espy, MD

## 2020-10-01 NOTE — ED Notes (Signed)
Daughter Caren Griffins informed of patients admission and room number.

## 2020-10-01 NOTE — Consult Note (Signed)
CODE SEPSIS - PHARMACY COMMUNICATION  **Broad Spectrum Antibiotics should be administered within 1 hour of Sepsis diagnosis**  Time Code Sepsis Called/Page Received: 1208  Antibiotics Ordered: [id number]  Time of 1st antibiotic administration: [id number]  Additional action taken by pharmacy: N/A  If necessary, Name of Provider/Nurse Contacted: N/A  Lorna Dibble ,PharmD Clinical Pharmacist  10/01/2020  12:50 PM

## 2020-10-01 NOTE — ED Notes (Signed)
Contacted lab to come draw 2nd set of cultures.

## 2020-10-01 NOTE — ED Provider Notes (Signed)
[facility] Emergency Department Provider Note ____________________________________________   Event Date/Time   First MD Initiated Contact with Patient 10/01/20 1103     (approximate)  I have reviewed the triage vital signs and the nursing notes.  HISTORY  Chief Complaint Altered Mental Status   HPI Ian Jimenez is a 87 y.o. malewho presents to the ED for evaluation of altered mentation.  Chart review indicates history of dementia, HTN, previous stroke and DM.  Resides at a local SNF.  Ambulates with assistance with a walker.   EMS brings the patient into the ED for evaluation of altered mentation from his baseline.  He was reportedly normal last night at about 8 PM.  When staff try to wake him up this morning, he had depressed mental status and refused to sit up in the side of the bed, and was not acting like himself.  Due to this, he is sent to the ED for evaluation.  EMS reports that the staff told them that patient has had increased blood clots in his urine over the past week.  Patient is unable to provide any relevant history due to his disorientation, altered mentation.  Past Medical History:  Diagnosis Date  . Dementia ([place])   . Diabetes mellitus without complication ([place])   . Hypertension   . Stroke [facility])     Patient Active Problem List   Diagnosis Date Noted  . Hypertension associated with diabetes ([place]) 05/06/2018  . Stage 3 chronic renal impairment associated with type 2 diabetes mellitus ([place]) 05/06/2018  . Dyslipidemia associated with type 2 diabetes mellitus ([place]) 05/06/2018  . Controlled type 2 diabetes mellitus with stage 3 chronic kidney disease ([place]) 05/06/2018  . Vascular dementia without behavioral disturbance ([place]) 05/06/2018  . Depression due to dementia ([place]) 05/06/2018  . GERD without esophagitis 05/06/2018  . Hypertensive urgency 04/30/2018  . TIA (transient ischemic attack) 04/10/2018    Past Surgical History:   Procedure Laterality Date  . none      Prior to Admission medications   Medication Sig Start Date End Date Taking? Authorizing Provider  acetaminophen (TYLENOL) 325 MG tablet Take 2 tablets (650 mg total) by mouth every 6 (six) hours as needed for mild pain (or Fever >/= 101). 05/03/18   Salary, Avel Peace, MD  amLODipine (NORVASC) 10 MG tablet Take 1 tablet (10 mg total) by mouth daily. 05/04/18   Salary, Avel Peace, MD  donepezil (ARICEPT) 10 MG tablet Take 10 mg by mouth at bedtime.    [provider]  escitalopram (LEXAPRO) 5 MG tablet Take 5 mg by mouth daily. 03/12/18   [provider]  glipiZIDE (GLUCOTROL) 5 MG tablet Take 1 tablet (5 mg total) by mouth 2 (two) times daily before a meal. 04/11/18   Gladstone Lighter, MD  hydrALAZINE (APRESOLINE) 25 MG tablet Take 25 mg by mouth 2 (two) times daily. 05/13/18   [provider]  metFORMIN (GLUCOPHAGE) 1000 MG tablet Take 1,000 mg by mouth 2 (two) times daily with a meal.    [provider]  metoprolol tartrate (LOPRESSOR) 25 MG tablet Take 1 tablet (25 mg total) by mouth 2 (two) times daily. 05/03/18   Salary, Avel Peace, MD  NON FORMULARY Diet type: Carb Diet    [provider]  omeprazole (PRILOSEC) 40 MG capsule Take 40 mg by mouth daily. 05/07/18   [provider]    Allergies Patient has no known allergies.  Family History  Problem Relation Age of  Onset  . Heart disease Father     Social History Social History   Tobacco Use  . Smoking status: Never Smoker  . Smokeless tobacco: Never Used  Vaping Use  . Vaping Use: Never used  Substance Use Topics  . Alcohol use: Never  . Drug use: Never    Review of Systems  Unable to be assessed due to patient's altered mentation. ____________________________________________   PHYSICAL EXAM:  VITAL SIGNS: Vitals:   10/01/20 1230 10/01/20 1330  BP: 116/64 (!) 119/56  Pulse: 84 78  Resp: 16 17  Temp:    SpO2: 96% (!) 88%      Constitutional: Somnolent.  Arouses to loud vocal stimulation and noxious stimulation.  Follows simple commands without apparent focal neurologic deficit. Eyes: Conjunctivae are normal. PERRL. EOMI. Head: Atraumatic. Nose: No congestion/rhinnorhea. Mouth/Throat: Mucous membranes are dry.  Oropharynx non-erythematous. Neck: No stridor. No cervical spine tenderness to palpation. Cardiovascular: Normal rate, regular rhythm. Grossly normal heart sounds.  Good peripheral circulation. Respiratory: Minimal tachypnea to the low 20s, no further evidence of distress.  No retractions. Lungs CTAB. Gastrointestinal: Soft , nondistended.  Diffuse tenderness to palpation without peritoneal features.  Palpation causes him to moan in discomfort.  Blood clots noted in his diaper that appear to be coming from his urethra. Musculoskeletal: No lower extremity tenderness nor edema.  No joint effusions. No signs of acute trauma. Neurologic:   No gross focal neurologic deficits are appreciated.  GCS 12 (E3V2M6) Skin:  Skin is warm, dry and intact. No rash noted. Psychiatric: Mood and affect are normal. Speech and behavior are normal.  ____________________________________________   LABS (all labs ordered are listed, but only abnormal results are displayed)  Labs Reviewed  LACTIC ACID, PLASMA - Abnormal; Notable for the following components:      Result Value   Lactic Acid, Venous 5.1 (*)    All other components within normal limits  COMPREHENSIVE METABOLIC PANEL - Abnormal; Notable for the following components:   Glucose, Bld 184 (*)    BUN 48 (*)    Creatinine, Ser 3.98 (*)    Calcium 8.2 (*)    Albumin 2.7 (*)    GFR, Estimated 14 (*)    All other components within normal limits  CBC WITH DIFFERENTIAL/PLATELET - Abnormal; Notable for the following components:   WBC 19.5 (*)    RBC 3.18 (*)    Hemoglobin 9.7 (*)    HCT 29.0 (*)    Neutro Abs 18.0 (*)    Lymphs Abs 0.3 (*)    Abs Immature  Granulocytes 0.17 (*)    All other components within normal limits  URINALYSIS, COMPLETE (UACMP) WITH MICROSCOPIC - Abnormal; Notable for the following components:   Color, Urine RED (*)    APPearance TURBID (*)    Glucose, UA   (*)    Value: TEST NOT REPORTED DUE TO COLOR INTERFERENCE OF URINE PIGMENT   Hgb urine dipstick   (*)    Value: TEST NOT REPORTED DUE TO COLOR INTERFERENCE OF URINE PIGMENT   Bilirubin Urine   (*)    Value: TEST NOT REPORTED DUE TO COLOR INTERFERENCE OF URINE PIGMENT   Ketones, ur   (*)    Value: TEST NOT REPORTED DUE TO COLOR INTERFERENCE OF URINE PIGMENT   Protein, ur   (*)    Value: TEST NOT REPORTED DUE TO COLOR INTERFERENCE OF URINE PIGMENT   Nitrite   (*)    Value: TEST NOT REPORTED DUE TO  COLOR INTERFERENCE OF URINE PIGMENT   Leukocytes,Ua   (*)    Value: TEST NOT REPORTED DUE TO COLOR INTERFERENCE OF URINE PIGMENT   RBC / HPF >50 (*)    WBC, UA >50 (*)    All other components within normal limits  TROPONIN I (HIGH SENSITIVITY) - Abnormal; Notable for the following components:   Troponin I (High Sensitivity) 206 (*)    All other components within normal limits  TROPONIN I (HIGH SENSITIVITY) - Abnormal; Notable for the following components:   Troponin I (High Sensitivity) 235 (*)    All other components within normal limits  URINE CULTURE  CULTURE, BLOOD (ROUTINE X 2)  CULTURE, BLOOD (ROUTINE X 2)  SARS CORONAVIRUS 2 (TAT 6-24 HRS)  APTT  PROTIME-INR  SAMPLE TO BLOOD BANK   ____________________________________________  12 Lead EKG  Sinus rhythm, rate of 94 bpm.  Normal axis.  Left bundle branch block.  No STEMI criteria by [place], does have some ST depressions laterally and inferiorly. ____________________________________________  RADIOLOGY  ED MD interpretation: 1 view CXR reviewed by me with cardiomegaly and left basilar atelectasis versus infiltrate.  Official radiology report(s): CT ABDOMEN PELVIS WO CONTRAST  Result Date:  10/01/2020 CLINICAL DATA:  Abdominal pain and hematuria EXAM: CT ABDOMEN AND PELVIS WITHOUT CONTRAST TECHNIQUE: Multidetector CT imaging of the abdomen and pelvis was performed following the standard protocol without IV contrast. COMPARISON:  Ultrasound from 07/12/2018 FINDINGS: Lower chest: Small left pleural effusion is noted with mild bibasilar atelectasis. Hepatobiliary: No focal liver abnormality is seen. No gallstones, gallbladder wall thickening, or biliary dilatation. Pancreas: Hypodensity in the region of the head of the pancreas which corresponds to a cystic lesion seen on the prior ultrasound examination. It measures approximately 15 mm. The remainder of the pancreas is within normal limits. Spleen: Normal in size without focal abnormality. Adrenals/Urinary Tract: Left adrenal gland is within normal limits. Right adrenal gland demonstrates a small 15 mm lesion best seen on image number 29 of series 2. Statistically this likely represents an adenoma. Right kidney is well visualized without obstructive change or renal calculi. Left kidney demonstrates evidence of small nonobstructing lower pole renal stone as well as hydronephrosis and hydroureter which extends to the level of the urinary bladder. No obstructing stone is noted. The bladder is decompressed and irregular hyperdense material is noted within the bladder likely representing thrombus given the clinical history. Underlying mass lesion at the bladder trigone may be present given the left-sided hydronephrosis. Stomach/Bowel: No obstructive or inflammatory changes of the colon are seen. The appendix is within normal limits. No small bowel or gastric abnormality is seen. Vascular/Lymphatic: Aortic atherosclerosis. No enlarged abdominal or pelvic lymph nodes. Reproductive: Prostate is unremarkable. Other: No abdominal wall hernia or abnormality. No abdominopelvic ascites. Musculoskeletal: Degenerative changes of lumbar spine are noted. IMPRESSION:  Left-sided hydronephrosis is noted without obstructing renal stone. This raises suspicion for possible bladder mass. Additionally hyperdense material is noted within the bladder consistent with thrombus. Direct visualization is recommended for further evaluation. Nonobstructing lower pole left renal stone. Stable hypodensity within the head of the pancreas unchanged from 2019. Small right renal lesion likely representing an adenoma but incompletely evaluated on this exam. No other focal abnormality is noted. Electronically Signed   By: Inez Catalina M.D.   On: 10/01/2020 13:25   CT Head Wo Contrast  Result Date: 10/01/2020 CLINICAL DATA:  Mental status changes. EXAM: CT HEAD WITHOUT CONTRAST TECHNIQUE: Contiguous axial images were obtained from the base of  the skull through the vertex without intravenous contrast. COMPARISON:  06/14/2020 FINDINGS: Brain: There is no evidence for acute hemorrhage, hydrocephalus, mass lesion, or abnormal extra-axial fluid collection. No definite CT evidence for acute infarction. Diffuse loss of parenchymal volume is consistent with atrophy. Patchy low attenuation in the deep hemispheric and periventricular white matter is nonspecific, but likely reflects chronic microvascular ischemic demyelination. Nonacute left parietooccipital infarct is new since prior. Vascular: No hyperdense vessel or unexpected calcification. Skull: No evidence for fracture. No worrisome lytic or sclerotic lesion. Sinuses/Orbits: The visualized paranasal sinuses and mastoid air cells are clear. Visualized portions of the globes and intraorbital fat are unremarkable. Other: None. IMPRESSION: 1. No acute intracranial abnormality. 2. Nonacute left parietooccipital infarct, new since prior. 3. Atrophy with chronic small vessel white matter ischemic disease. Electronically Signed   By: Misty Stanley M.D.   On: 10/01/2020 13:25   DG Chest Portable 1 View  Result Date: 10/01/2020 CLINICAL DATA:  87 year old  male with altered mental status onset this morning. EXAM: PORTABLE CHEST 1 VIEW COMPARISON:  Portable chest 11/25/2018 and earlier. FINDINGS: Portable AP semi upright view at 1137 hours. The patient is more rotated to the left. Possible cardiomegaly which is new since 2020. Other mediastinal contours are within normal limits. Subsequent left lung base hypo ventilation. But elsewhere Allowing for portable technique the lungs are clear. No pneumothorax. No acute osseous abnormality identified. Paucity of bowel gas in the upper abdomen. IMPRESSION: 1. Questionable new or increased cardiomegaly since 2020, associated left lung base atelectasis. 2. No other acute cardiopulmonary abnormality. Electronically Signed   By: Genevie Ann M.D.   On: 10/01/2020 11:56    ____________________________________________   PROCEDURES and INTERVENTIONS  Procedure(s) performed (including Critical Care):  .1-3 Lead EKG Interpretation Performed by: Vladimir Crofts, MD Authorized by: Vladimir Crofts, MD     Interpretation: normal     ECG rate:  90   ECG rate assessment: normal     Rhythm: sinus rhythm     Ectopy: none     Conduction: normal   .Critical Care Performed by: Vladimir Crofts, MD Authorized by: Vladimir Crofts, MD   Critical care provider statement:    Critical care time (minutes):  45   Critical care was necessary to treat or prevent imminent or life-threatening deterioration of the following conditions:  Sepsis   Critical care was time spent personally by me on the following activities:  Discussions with consultants, evaluation of patient's response to treatment, examination of patient, ordering and performing treatments and interventions, ordering and review of laboratory studies, ordering and review of radiographic studies, pulse oximetry, re-evaluation of patient's condition, obtaining history from patient or surrogate and review of old charts    Medications  lactated ringers infusion (has no administration in  time range)  cefTRIAXone (ROCEPHIN) 1 g in sodium chloride 0.9 % 100 mL IVPB (0 g Intravenous Stopped 10/01/20 1338)  heparin injection 4,000 Units (has no administration in time range)  lactated ringers bolus 1,000 mL (0 mLs Intravenous Stopped 10/01/20 1309)  lactated ringers bolus 1,000 mL (0 mLs Intravenous Stopped 10/01/20 1338)    ____________________________________________   MDM / ED COURSE   87 year old male presents from a local memory care unit altered, with evidence of sepsis and NSTEMI, requiring medical admission.  Hemodynamically stable and not tachycardic.  Exam with depressed GCS of 11-12 without evidence of focal neurologic deficits.  No significant distress or signs of trauma.  He appears dry on exam and has multiple blood  clots noted in his diaper that appear to be urologic in nature.  Diffuse abdominal tenderness, particular to the lower quadrants, without evidence of peritonitis.  Blood work demonstrates stigmata of sepsis, AKI and dehydration.  Cultures were drawn and patient was started on Rocephin to treat urosepsis.  Blood work further with elevation of troponin and his EKG demonstrates what appears to be a new left bundle, but no STEMI criteria.  Heparin drip was ordered around the time of admission, but was held due to his imminent procedure with IR, and plan to restart immediately thereafter.  I discussed the case with urology, Dr. Erlene Quan, who recommends IR nephrostomy tube.  I speak with IR to facilitate this, and they plan to do so this afternoon.  Patient remains hemodynamically stable in the ED and we will discussed the case with hospitalist medicine for admission.  Clinical Course as of 10/01/20 1539  Mon Oct 01, 2020  1126 Nurse informs me of significant blood clots in his diaper, likely coming from his urethra.  I could reevaluate, and note these blood clots in his diaper.  Add CT abdomen/pelvis with IV contrast. [DS]  1230 Troponin noted.  EKG reviewed again.  No  STEMI criteria.  Will await second troponin for trend before starting heparin [DS]  1245 Reassessed.  Clinically similar.  Daughter is at the bedside and provides additional history.  She last saw him this past Friday, and he was normal then.  They were out of town for couple days this weekend, and have not seen him since then. [DS]  [id number] CT noted, urology paged.  [DS]  [id number] troponins despite resuscitation noted.  Concern for NSTEMI with his EKG changes.  We will add a heparin drip. [DS]  1401 I spoke with Dr. Blaine Hamper, who agrees to admit [DS]  1407 I spoke with Dr. Erlene Quan. She will eval the patient, requests IR Nephrostomy tube [DS]  1424 I speak with Dr. Serafina Royals, IR on call, he reports that he will send his PA to evaluate the patient and initiate preoperative planning.  Reports her left-sided nephrostomy tube is reasonable. [DS]    Clinical Course User Index [DS] Vladimir Crofts, MD    ____________________________________________   FINAL CLINICAL IMPRESSION(S) / ED DIAGNOSES  Final diagnoses:  Sepsis with acute renal failure and tubular necrosis without septic shock, due to unspecified organism [facility])  NSTEMI (non-ST elevated myocardial infarction) [facility])     ED Discharge Orders    None       Trelon Plush   Note:  This document was prepared using Dragon voice recognition software and may include unintentional dictation errors.   Vladimir Crofts, MD 10/01/20 [phone]

## 2020-10-01 NOTE — ED Notes (Signed)
Dr Vladimir Crofts notified of Critical lactic of 5.1. MD to follow up.

## 2020-10-01 NOTE — Consult Note (Signed)
ANTICOAGULATION CONSULT NOTE - Initial Consult  Pharmacy Consult for: antiplatelet/anticoauglant, NSAID review Indication: Post IR Procedure  No Known Allergies   Labs: Recent Labs    10/01/20 1124 10/01/20 1246 10/01/20 1525  HGB 9.7*  --   --   HCT 29.0*  --   --   PLT 237  --   --   APTT 41*  --   --   LABPROT 15.2  --   --   INR 1.2  --   --   CREATININE 3.98*  --   --   TROPONINIHS 206* 235* 400*    Estimated Creatinine Clearance: 13.8 mL/min (A) (by C-G formula based on SCr of 3.98 mg/dL (H)).   Medical History: Past Medical History:  Diagnosis Date  . Dementia ([place])   . Diabetes mellitus without complication ([place])   . Hypertension   . Stroke [facility])    Procedure Bleeding Risk Assigned: HIGH  Medications:  ASA 81 mg daily to start 02/28 @ 1500 Heparin gtt for ACS to start 02/28 @ 1430   Assessment: 87 yo Male with PMH sig for HTN, HLD, stroke/TIA, vasc dementia, CKD3, MDD presenting with AMS started on heparin infusion for ACS. Patient also undersent nephrostomy tube placement 02/28 @ 1600. Post IR bleeding risk assigned = HIGH bleeding risk.    Plan:   Would typically delay heparin therapeutic dose for 12 hours. Per radiology note: okay to resume anticoagulation immediately  Will withhold heparin bolus dose, and initiate heparin infusion  Okay to start aspirin therapy today   Dorothe Pea, PharmD, BCPS Clinical Pharmacist  10/01/2020,4:21 PM

## 2020-10-01 NOTE — Progress Notes (Signed)
Notified bedside nurse of need to draw repeat lactic acid.  Following for code sepsis.

## 2020-10-01 NOTE — Sepsis Progress Note (Signed)
elink is following this code sepsis 

## 2020-10-02 ENCOUNTER — Encounter: Payer: Self-pay | Admitting: Internal Medicine

## 2020-10-02 DIAGNOSIS — A414 Sepsis due to anaerobes: Secondary | ICD-10-CM

## 2020-10-02 DIAGNOSIS — N10 Acute tubulo-interstitial nephritis: Secondary | ICD-10-CM | POA: Diagnosis not present

## 2020-10-02 DIAGNOSIS — I5020 Unspecified systolic (congestive) heart failure: Secondary | ICD-10-CM

## 2020-10-02 DIAGNOSIS — I272 Pulmonary hypertension, unspecified: Secondary | ICD-10-CM

## 2020-10-02 DIAGNOSIS — E875 Hyperkalemia: Secondary | ICD-10-CM

## 2020-10-02 DIAGNOSIS — D649 Anemia, unspecified: Secondary | ICD-10-CM

## 2020-10-02 DIAGNOSIS — R778 Other specified abnormalities of plasma proteins: Secondary | ICD-10-CM

## 2020-10-02 DIAGNOSIS — N186 End stage renal disease: Secondary | ICD-10-CM

## 2020-10-02 LAB — CBC
HCT: 25 % — ABNORMAL LOW (ref 39.0–52.0)
Hemoglobin: 8 g/dL — ABNORMAL LOW (ref 13.0–17.0)
MCH: 29.3 pg (ref 26.0–34.0)
MCHC: 32 g/dL (ref 30.0–36.0)
MCV: 91.6 fL (ref 80.0–100.0)
Platelets: 196 10*3/uL (ref 150–400)
RBC: 2.73 MIL/uL — ABNORMAL LOW (ref 4.22–5.81)
RDW: 13.8 % (ref 11.5–15.5)
WBC: 10.9 10*3/uL — ABNORMAL HIGH (ref 4.0–10.5)
nRBC: 0 % (ref 0.0–0.2)

## 2020-10-02 LAB — BLOOD CULTURE ID PANEL (REFLEXED) - BCID2

## 2020-10-02 LAB — GLUCOSE, CAPILLARY
Glucose-Capillary: 169 mg/dL — ABNORMAL HIGH (ref 70–99)
Glucose-Capillary: 148 mg/dL — ABNORMAL HIGH (ref 70–99)
Glucose-Capillary: 141 mg/dL — ABNORMAL HIGH (ref 70–99)
Glucose-Capillary: 150 mg/dL — ABNORMAL HIGH (ref 70–99)

## 2020-10-02 LAB — ECHOCARDIOGRAM COMPLETE
AR max vel: 1.95 cm2
AV Peak grad: 6.2 mmHg
Ao pk vel: 1.24 m/s
Area-P 1/2: 6.37 cm2
Calc EF: 29.9 %
Height: 69 in
S' Lateral: 4.2 cm
Single Plane A2C EF: 31.8 %
Single Plane A4C EF: 29.9 %
Weight: 2880 oz

## 2020-10-02 LAB — LIPID PANEL
Cholesterol: 112 mg/dL (ref 0–200)
HDL: 36 mg/dL — ABNORMAL LOW (ref >40)
LDL Cholesterol: 58 mg/dL (ref 0–99)
Total CHOL/HDL Ratio: 3.1 RATIO
Triglycerides: 89 mg/dL (ref <150)
VLDL: 18 mg/dL (ref 0–40)

## 2020-10-02 LAB — LACTIC ACID, PLASMA
Lactic Acid, Venous: 1.9 mmol/L (ref 0.5–1.9)
Lactic Acid, Venous: 2.9 mmol/L (ref 0.5–1.9)

## 2020-10-02 LAB — BASIC METABOLIC PANEL
Anion gap: 13 (ref 5–15)
BUN: 58 mg/dL — ABNORMAL HIGH (ref 8–23)
CO2: 21 mmol/L — ABNORMAL LOW (ref 22–32)
Calcium: 7.6 mg/dL — ABNORMAL LOW (ref 8.9–10.3)
Chloride: 104 mmol/L (ref 98–111)
Creatinine, Ser: 4.03 mg/dL — ABNORMAL HIGH (ref 0.61–1.24)
GFR, Estimated: 14 mL/min — ABNORMAL LOW (ref >60)
Glucose, Bld: 172 mg/dL — ABNORMAL HIGH (ref 70–99)
Potassium: 4.8 mmol/L (ref 3.5–5.1)
Sodium: 138 mmol/L (ref 135–145)

## 2020-10-02 LAB — TROPONIN I (HIGH SENSITIVITY): Troponin I (High Sensitivity): 2102 ng/L (ref <18)

## 2020-10-02 LAB — CBC WITH DIFFERENTIAL/PLATELET
Abs Immature Granulocytes: 0.05 10*3/uL (ref 0.00–0.07)
Basophils Absolute: 0 10*3/uL (ref 0.0–0.1)
Basophils Relative: 0 %
Eosinophils Absolute: 0 10*3/uL (ref 0.0–0.5)
Eosinophils Relative: 0 %
HCT: 26.7 % — ABNORMAL LOW (ref 39.0–52.0)
Hemoglobin: 8.7 g/dL — ABNORMAL LOW (ref 13.0–17.0)
Immature Granulocytes: 1 %
Lymphocytes Relative: 6 %
Lymphs Abs: 0.6 10*3/uL — ABNORMAL LOW (ref 0.7–4.0)
MCH: 30.1 pg (ref 26.0–34.0)
MCHC: 32.6 g/dL (ref 30.0–36.0)
MCV: 92.4 fL (ref 80.0–100.0)
Monocytes Absolute: 0.7 10*3/uL (ref 0.1–1.0)
Monocytes Relative: 7 %
Neutro Abs: 8.5 10*3/uL — ABNORMAL HIGH (ref 1.7–7.7)
Neutrophils Relative %: 86 %
Platelets: 176 10*3/uL (ref 150–400)
RBC: 2.89 MIL/uL — ABNORMAL LOW (ref 4.22–5.81)
RDW: 14.1 % (ref 11.5–15.5)
WBC: 9.8 10*3/uL (ref 4.0–10.5)
nRBC: 0 % (ref 0.0–0.2)

## 2020-10-02 LAB — IRON AND TIBC
Iron: 6 ug/dL — ABNORMAL LOW (ref 45–182)
Saturation Ratios: 4 % — ABNORMAL LOW (ref 17.9–39.5)
TIBC: 168 ug/dL — ABNORMAL LOW (ref 250–450)
UIBC: 162 ug/dL

## 2020-10-02 LAB — SARS CORONAVIRUS 2 (TAT 6-24 HRS): SARS Coronavirus 2: NEGATIVE

## 2020-10-02 LAB — HEMOGLOBIN A1C
Hgb A1c MFr Bld: 6.6 % — ABNORMAL HIGH (ref 4.8–5.6)
Mean Plasma Glucose: 142.72 mg/dL

## 2020-10-02 LAB — HEPARIN LEVEL (UNFRACTIONATED)
Heparin Unfractionated: 0.31 IU/mL (ref 0.30–0.70)
Heparin Unfractionated: 0.31 IU/mL (ref 0.30–0.70)

## 2020-10-02 LAB — VITAMIN B12: Vitamin B-12: 290 pg/mL (ref 180–914)

## 2020-10-02 LAB — PSA: Prostatic Specific Antigen: 0.23 ng/mL (ref 0.00–4.00)

## 2020-10-02 MED ORDER — SODIUM CHLORIDE 0.9 % IV SOLN
2.0000 g | INTRAVENOUS | Status: DC
  Administered 2020-10-02 – 2020-10-04 (×3): 2 g via INTRAVENOUS
  Filled 2020-10-02 (×3): qty 20

## 2020-10-02 MED ORDER — CARVEDILOL 3.125 MG PO TABS
3.1250 mg | ORAL_TABLET | Freq: Two times a day (BID) | ORAL | Status: DC
  Administered 2020-10-02 – 2020-10-05 (×6): 3.125 mg via ORAL
  Filled 2020-10-02 (×6): qty 1

## 2020-10-02 NOTE — Consult Note (Signed)
ANTICOAGULATION CONSULT NOTE  Pharmacy Consult for heparin Indication: chest pain/ACS (NSTEMI)  Patient Measurements: Heparin Dosing Weight: 81.6 kg  Labs: Recent Labs    10/01/20 1124 10/01/20 1246 10/01/20 1525 10/01/20 2040 10/01/20 2248 10/02/20 0725 10/02/20 0806 10/02/20 1034 10/02/20 1124 10/02/20 1634  HGB 9.7*  --   --   --   --  8.0*  --   --  8.7*  --   HCT 29.0*  --   --   --   --  25.0*  --   --  26.7*  --   PLT 237  --   --   --   --  196  --   --  176  --   APTT 41*  --   --   --   --   --   --   --   --   --   LABPROT 15.2  --   --   --   --   --   --   --   --   --   INR 1.2  --   --   --   --   --   --   --   --   --   HEPARINUNFRC  --   --   --   --  0.17*  --  0.31  --   --  0.31  CREATININE 3.98*  --   --   --   --   --   --  4.03*  --   --   TROPONINIHS 206*   < > 400* 1,511*  --  2,102*  --   --   --   --    < > = values in this interval not displayed.    Estimated Creatinine Clearance: 13.6 mL/min (A) (by C-G formula based on SCr of 4.03 mg/dL (H)).   Medications:  PTA: Plavix. No other AC or APT PTA per fill history or care everywhere.  Assessment: 87 yo Male with PMH sig for HTN, HLD, stroke/TIA, vasc dementia, CKD3, MDD presenting with AMS. ISO EKG changes and troponin trending upward pharmacy has been consulted for the management of hep gtt. Patient underwent IR procedure designated as HIGH BLEEDING risk. Per radiology note: okay to being anticoagulation immediately.   Baseline: Hgb 9.7, Plt 237 Trop: 206 >> 2102 PT/INR: 1.2 aPTT: 41  Date Time HL Rate/Comment 2/28 2248 0.17 Subthera, 1000 un/hr 3/1 0806 0.31 Thera x1, 1300 un/hr 3/1  1634 0.31 Thera x2; 1300 un/hr  Plts: [phone] (downtrending) Hgb: 9.7>8.0>8.7 (stable/low)  Goal of Therapy:  Heparin level 0.3-0.7 units/ml Monitor platelets by anticoagulation protocol: Yes   Plan:  Reassess need at 48hrs follow up with cardiology recommendations for medical  management. --Continue heparin infusion at 1300 units/hr --HL therapeutic consecutively x2; will re-check HL daily hereafter --Daily CBC per protocol while on IV heparin, H&H / platelets down-trending  Shanon Brow Cheila Wickstrom 10/02/2020,5:18 PM

## 2020-10-02 NOTE — Progress Notes (Signed)
Urology Inpatient Progress Note  Subjective: No acute events overnight. Lactate down today, 1.9.  WBC count down today, 10.9.  Hemoglobin and hematocrit down today, 8.0 and 25.0, respectively.  Blood cultures positive for Klebsiella pneumoniae.  Urine culture pending.  On antibiotics as below. No urinary output documented.  Condom cath in place is completely dry.  Left nephrostomy tube in place draining yellow urine with some bloody debris. Patient is sleeping today, arousable, but immediately falls back asleep and cannot contribute to HPI.  On suprapubic palpation, he awakens to moan in discomfort but remains nonverbal.  Anti-infectives: Anti-infectives (From admission, onward)   Start     Dose/Rate Route Frequency Ordered Stop   10/02/20 0400  cefTRIAXone (ROCEPHIN) 2 g in sodium chloride 0.9 % 100 mL IVPB        2 g 200 mL/hr over 30 Minutes Intravenous Every 24 hours 10/02/20 0320     10/01/20 1215  cefTRIAXone (ROCEPHIN) 1 g in sodium chloride 0.9 % 100 mL IVPB  Status:  Discontinued        1 g 200 mL/hr over 30 Minutes Intravenous Every 24 hours 10/01/20 1206 10/02/20 0319      Current Facility-Administered Medications  Medication Dose Route Frequency Provider Last Rate Last Admin  . albuterol (PROVENTIL) (2.5 MG/3ML) 0.083% nebulizer solution 2.5 mg  2.5 mg Nebulization Q4H PRN Ivor Costa, MD      . aspirin EC tablet 81 mg  81 mg Oral Daily Ivor Costa, MD   81 mg at 10/02/20 0817  . atorvastatin (LIPITOR) tablet 40 mg  40 mg Oral Daily Ivor Costa, MD   40 mg at 10/02/20 0816  . carbidopa-levodopa (SINEMET IR) 25-100 MG per tablet immediate release 1 tablet  1 tablet Oral TID Ivor Costa, MD   1 tablet at 10/02/20 0816  . cefTRIAXone (ROCEPHIN) 2 g in sodium chloride 0.9 % 100 mL IVPB  2 g Intravenous Q24H Sharion Settler, NP 200 mL/hr at 10/02/20 0511 2 g at 10/02/20 0511  . dextromethorphan-guaiFENesin (MUCINEX DM) 30-600 MG per 12 hr tablet 1 tablet  1 tablet Oral BID PRN Ivor Costa, MD      . divalproex (DEPAKOTE) DR tablet 250 mg  250 mg Oral BID Ivor Costa, MD   250 mg at 10/02/20 0817  . donepezil (ARICEPT) tablet 10 mg  10 mg Oral QHS Ivor Costa, MD   10 mg at 10/01/20 2345  . escitalopram (LEXAPRO) tablet 5 mg  5 mg Oral Daily Ivor Costa, MD   5 mg at 10/02/20 0816  . heparin ADULT infusion 100 units/mL (25000 units/216m)  1,300 Units/hr Intravenous Continuous NIvor Costa MD 13 mL/hr at 10/02/20 0015 1,300 Units/hr at 10/02/20 0015  . insulin aspart (novoLOG) injection 0-5 Units  0-5 Units Subcutaneous QHS NIvor Costa MD      . insulin aspart (novoLOG) injection 0-9 Units  0-9 Units Subcutaneous TID WC NIvor Costa MD   2 Units at 10/02/20 [phone] . iodixanol (VISIPAQUE) 320 MG/ML injection 50 mL  50 mL Other Once Suttle, DRosanne Ashing MD      . loperamide (IMODIUM) capsule 2 mg  2 mg Oral PRN NIvor Costa MD      . morphine 2 MG/ML injection 1 mg  1 mg Intravenous Q4H PRN NIvor Costa MD      . ondansetron ([facility] injection 4 mg  4 mg Intravenous Q6H PRN MSharion Settler NP   4 mg at 10/01/20 2058  . oxyCODONE-acetaminophen (PERCOCET/ROXICET) 5-325  MG per tablet 1 tablet  1 tablet Oral Q4H PRN Ivor Costa, MD      . sodium chloride flush (NS) 0.9 % injection 5 mL  5 mL Intracatheter Q8H Suttle, Rosanne Ashing, MD   5 mL at 10/02/20 0511   Objective: Vital signs in last 24 hours: Temp:  [98 F (36.7 C)-100.1 F (37.8 C)] 98 F (36.7 C) (03/01 0757) Pulse Rate:  [75-102] 75 (03/01 0757) Resp:  [15-25] 15 (03/01 0757) BP: (107-166)/(56-73) 143/66 (03/01 0757) SpO2:  [88 %-99 %] 99 % (03/01 0757) Weight:  [71.3 kg-81.6 kg] 74.8 kg (03/01 0001)  Intake/Output from previous day: 02/28 0701 - 03/01 0700 In: 40 [P.O.:30; I.V.:10] Out: 200 [Drains:200] Intake/Output this shift: No intake/output data recorded.  Physical Exam Vitals and nursing note reviewed.  Constitutional:      General: He is sleeping.  HENT:     Head: Normocephalic and atraumatic.  Pulmonary:      Effort: Pulmonary effort is normal. No respiratory distress.  Abdominal:     Comments: Suprapubic tenderness without palpable bladder.  Skin:    General: Skin is warm and dry.    Lab Results:  Recent Labs    10/01/20 1124 10/02/20 0725  WBC 19.5* 10.9*  HGB 9.7* 8.0*  HCT 29.0* 25.0*  PLT 237 196   BMET Recent Labs    10/01/20 1124  NA 140  K 4.2  CL 104  CO2 23  GLUCOSE 184*  BUN 48*  CREATININE 3.98*  CALCIUM 8.2*   PT/INR Recent Labs    10/01/20 1124  LABPROT 15.2  INR 1.2   Assessment & Plan: 87 year old comorbid male with dementia admitted for management of sepsis of likely urinary source with left-sided hydronephrosis possibly secondary to an obstructing mass, now s/p left nephrostomy tube placement.  Patient clinically improving today.  Nephrostomy tube is draining well, however he does not appear to be voiding and is uncomfortable on suprapubic palpation.  Recommend bladder scan to assess capacity and Foley catheter placement if volume is >48m.  This case, recommend placement of a 259French Foley catheter given recent passage of clots to allow for ease of irrigation if needed.  Urine cytology, creatinine, and PSA as previously recommended have not yet been completed.  Will place order for these today.  SDebroah Loop PA-C 10/02/2020

## 2020-10-02 NOTE — Progress Notes (Signed)
PHARMACY - PHYSICIAN COMMUNICATION CRITICAL VALUE ALERT - BLOOD CULTURE IDENTIFICATION (BCID)  Ian Jimenez is an 87 y.o. male who presented to [facility] on 10/01/2020 with a chief complaint of pyelonephritis   Assessment:  Kleb pneumo growing in 1 of 4 bottles (aerobic) , no resistance  (include suspected source if known)  Name of physician (or Provider) Contacted: Sharion Settler  Current antibiotics:   Ceftriaxone 1 gm IV Q24H  Changes to prescribed antibiotics recommended:  Recommendations accepted by provider  Will increase dose to ceftriaxone 2 gm IV Q24H  Results for orders placed or performed during the hospital encounter of 10/01/20  Blood Culture ID Panel (Reflexed) (Collected: 10/01/2020 11:24 AM)  Result Value Ref Range   Enterococcus faecalis NOT DETECTED NOT DETECTED   Enterococcus Faecium NOT DETECTED NOT DETECTED   Listeria monocytogenes NOT DETECTED NOT DETECTED   Staphylococcus species NOT DETECTED NOT DETECTED   Staphylococcus aureus (BCID) NOT DETECTED NOT DETECTED   Staphylococcus epidermidis NOT DETECTED NOT DETECTED   Staphylococcus lugdunensis NOT DETECTED NOT DETECTED   Streptococcus species NOT DETECTED NOT DETECTED   Streptococcus agalactiae NOT DETECTED NOT DETECTED   Streptococcus pneumoniae NOT DETECTED NOT DETECTED   Streptococcus pyogenes NOT DETECTED NOT DETECTED   A.calcoaceticus-baumannii NOT DETECTED NOT DETECTED   Bacteroides fragilis NOT DETECTED NOT DETECTED   Enterobacterales DETECTED (A) NOT DETECTED   Enterobacter cloacae complex NOT DETECTED NOT DETECTED   Escherichia coli NOT DETECTED NOT DETECTED   Klebsiella aerogenes NOT DETECTED NOT DETECTED   Klebsiella oxytoca NOT DETECTED NOT DETECTED   Klebsiella pneumoniae DETECTED (A) NOT DETECTED   Proteus species NOT DETECTED NOT DETECTED   Salmonella species NOT DETECTED NOT DETECTED   Serratia marcescens NOT DETECTED NOT DETECTED   Haemophilus influenzae NOT DETECTED NOT DETECTED    Neisseria meningitidis NOT DETECTED NOT DETECTED   Pseudomonas aeruginosa NOT DETECTED NOT DETECTED   Stenotrophomonas maltophilia NOT DETECTED NOT DETECTED   Candida albicans NOT DETECTED NOT DETECTED   Candida auris NOT DETECTED NOT DETECTED   Candida glabrata NOT DETECTED NOT DETECTED   Candida krusei NOT DETECTED NOT DETECTED   Candida parapsilosis NOT DETECTED NOT DETECTED   Candida tropicalis NOT DETECTED NOT DETECTED   Cryptococcus neoformans/gattii NOT DETECTED NOT DETECTED   CTX-M ESBL NOT DETECTED NOT DETECTED   Carbapenem resistance IMP NOT DETECTED NOT DETECTED   Carbapenem resistance KPC NOT DETECTED NOT DETECTED   Carbapenem resistance NDM NOT DETECTED NOT DETECTED   Carbapenem resist OXA 48 LIKE NOT DETECTED NOT DETECTED   Carbapenem resistance VIM NOT DETECTED NOT DETECTED    Robbins,Jason D 10/02/2020  3:20 AM

## 2020-10-02 NOTE — Consult Note (Signed)
ANTICOAGULATION CONSULT NOTE  Pharmacy Consult for heparin Indication: chest pain/ACS (NSTEMI)  Patient Measurements: Heparin Dosing Weight: 81.6 kg  Labs: Recent Labs    10/01/20 1124 10/01/20 1246 10/01/20 1525 10/01/20 2040 10/01/20 2248 10/02/20 0725 10/02/20 0806  HGB 9.7*  --   --   --   --  8.0*  --   HCT 29.0*  --   --   --   --  25.0*  --   PLT 237  --   --   --   --  196  --   APTT 41*  --   --   --   --   --   --   LABPROT 15.2  --   --   --   --   --   --   INR 1.2  --   --   --   --   --   --   HEPARINUNFRC  --   --   --   --  0.17*  --  0.31  CREATININE 3.98*  --   --   --   --   --   --   TROPONINIHS 206*   < > 400* 1,511*  --  2,102*  --    < > = values in this interval not displayed.    Estimated Creatinine Clearance: 13.8 mL/min (A) (by C-G formula based on SCr of 3.98 mg/dL (H)).   Medications:  PTA: Plavix. No other AC or APT PTA per fill history or care everywhere.  Assessment: 87 yo Male with PMH sig for HTN, HLD, stroke/TIA, vasc dementia, CKD3, MDD presenting with AMS. ISO EKG changes and troponin trending upward pharmacy has been consulted for the management of hep gtt. Patient underwent IR procedure designated as HIGH BLEEDING risk. Per radiology note: okay to being anticoagulation immediately.   Baseline: Hgb 9.7, Plt 237 Trop: 206 >> 2102 PT/INR: 1.2 aPTT: 41  Date Time HL Rate/Comment 2/28 2248 0.17 Subthera, 1000 un/hr 3/1 0806 0.31 Thera, 1300 un/hr  Goal of Therapy:  Heparin level 0.3-0.7 units/ml Monitor platelets by anticoagulation protocol: Yes   Plan:  --Heparin level is therapeutic x 1. Continue heparin infusion at 1300 units/hr --Re-check confirmatory HL at 1600 --Daily CBC per protocol while on IV heparin, H&H / platelets down-trending  Benita Gutter 10/02/2020,9:27 AM

## 2020-10-02 NOTE — Consult Note (Addendum)
Cardiology Consultation:   Patient ID: Ian Jimenez MRN: [id number]; DOB: 05-23-1934  Admit date: 10/01/2020 Date of Consult: 10/02/2020  PCP:  Albina Billet, MD   [place]  Cardiologist: [facility], Dr. Saunders Revel rounding Advanced Practice Provider:  No care team member to display Electrophysiologist:  None [id number]}    Patient Profile:   Ian Jimenez is a 87 y.o. male with a hx of new acute HFrEF (EF 25-30%), possible ASD with L-R shunting, pulmonary HTN, new LBBB, DM2, HTN, prior CVA, dementia, CKD/ESRD who is being seen today for the evaluation of new acute HFrEF and elevated troponin at the request of Dr. Roosevelt Locks.  History of Present Illness:   Mr. Encinias is an 87 year old male with history as above and including a history of dementia.    Due to patient's status of nonverbal during today's consultation, 2 attempts were made to reach the patient's family.  The patient's spouse was listed as a contact in his chart, but I was unable to reach her on both attempts.  We will attempt to reach her later today.  The below history was obtained via chart biopsy and review of EMR.  He has no previously known cardiac history.  He does not have a regular cardiologist.    He presented to [facility] from a local SNF for evaluation of altered mental status from baseline.  He was reportedly at his baseline status at approximately 8 PM on 09/30/2020.  Staff attempted to wake him 10/01/2020 and noted depressed mental status.  The patient refused to sit up on the side of the bed.  He was not acting his usual self; therefore, they called EMS.  Per EMS, the staff informed them that he had increased blood clots in his urine over the last week.  He was unable to provide any relevant history due to his altered mental status.  In the ED, he was noted to be hypoxic at 88%.  Labs showed lactic acidosis, hyperglycemia, AKI, hypoalbuminemia, and severe anemia.  Specifically, lactic acid 5.1, glucose 184, creatinine  3.98, BUN 48, calcium 8.2, albumin 2.7, hemoglobin 9.7, RBC 3.  1 8, hematocrit 29.0.  UA noted to be red in appearance.  High-sensitivity troponin 206, 235.  EKG showed new left bundle branch block but no acute ST/T changes.  Chest x-ray showed possible new or increased cardiomegaly since 2020 with left lung base atelectasis.  CT head was without acute abnormality but did show nonacute left parieto-occipital infarct.  Exam was significant for diffuse abdominal tenderness and urology consulted.  CT abdomen showed left side hydronephrosis without obstructing renal stone and possible bladder mass.  Hyperdense material noted in the bladder consistent with thrombus.  Nonobstructing lower pole left renal stone.  Stable hypodensity of the head of the pancreas was noted.  Small right renal lesion also noted representing possible adenoma but not completely evaluated.  Left nephrostomy tube has been placed.  He was started on antibiotics and heparin drip.    Since admission, significant blood clots have been noted in his diaper.  Anemia has worsened with hgb 8.7 and HCT 26.7.  Pending CBC today.  HS Tn now 2,102.0.  AKI has worsened with Cr 4.03 and BUN 58. Echo shows newly reduced EF 25-30%, suspect ASD, RAP 32mHg.   Past Medical History:  Diagnosis Date  . Dementia ([place]   . [place] mellitus without complication ([place]   . Hypertension   . Stroke ([facility]     Past Surgical History:  Procedure Laterality Date  . IR NEPHROSTOMY PLACEMENT LEFT  10/01/2020  . none       Home Medications:  Prior to Admission medications   Medication Sig Start Date End Date Taking? Authorizing Provider  amLODipine (NORVASC) 10 MG tablet Take 1 tablet (10 mg total) by mouth daily. 05/04/18  Yes Salary, Holly Bodily D, MD  carbidopa-levodopa (SINEMET IR) 25-100 MG tablet Take 1 tablet by mouth 3 (three) times daily. 09/01/20  Yes [provider]  clopidogrel (PLAVIX) 75 MG tablet Take 75 mg by mouth daily. 09/22/20  Yes  [provider]  divalproex (DEPAKOTE) 250 MG DR tablet Take 250 mg by mouth 2 (two) times daily. 09/22/20  Yes [provider]  donepezil (ARICEPT) 10 MG tablet Take 10 mg by mouth at bedtime.   Yes [provider]  escitalopram (LEXAPRO) 5 MG tablet Take 5 mg by mouth daily. 03/12/18  Yes [provider]  glipiZIDE (GLUCOTROL) 5 MG tablet Take 1 tablet (5 mg total) by mouth 2 (two) times daily before a meal. 04/11/18  Yes Gladstone Lighter, MD  hydrALAZINE (APRESOLINE) 25 MG tablet Take 25 mg by mouth 2 (two) times daily. 05/13/18  Yes [provider]  loperamide (IMODIUM) 2 MG capsule Take 2 mg by mouth as needed for diarrhea or loose stools. 1 capsule mon, wed, fri, for excessive large bowel movments   Yes [provider]  metFORMIN (GLUCOPHAGE) 1000 MG tablet Take 500 mg by mouth at bedtime. At supper   Yes [provider]  metoprolol tartrate (LOPRESSOR) 25 MG tablet Take 1 tablet (25 mg total) by mouth 2 (two) times daily. 05/03/18  Yes Salary, Avel Peace, MD  acetaminophen (TYLENOL) 325 MG tablet Take 2 tablets (650 mg total) by mouth every 6 (six) hours as needed for mild pain (or Fever >/= 101). 05/03/18   Salary, Avel Peace, MD  NON FORMULARY Diet type: Carb Diet    [provider]  omeprazole (PRILOSEC) 40 MG capsule Take 40 mg by mouth daily. Patient not taking: No sig reported 05/07/18   [provider]  ondansetron (ZOFRAN-ODT) 4 MG disintegrating tablet Take 4 mg by mouth every 8 (eight) hours as needed. 08/03/20   [provider]    Inpatient Medications: Scheduled Meds: . aspirin EC  81 mg Oral Daily  . atorvastatin  40 mg Oral Daily  . carbidopa-levodopa  1 tablet Oral TID  . carvedilol  3.125 mg Oral BID WC  . divalproex  250 mg Oral BID  . donepezil  10 mg Oral QHS  . escitalopram  5 mg Oral Daily  . insulin aspart  0-5 Units Subcutaneous QHS  . insulin aspart  0-9 Units Subcutaneous TID WC   . iodixanol  50 mL Other Once  . sodium chloride flush  5 mL Intracatheter Q8H   Continuous Infusions: . cefTRIAXone (ROCEPHIN)  IV 2 g (10/02/20 0511)  . heparin 1,300 Units/hr (10/02/20 0015)   PRN Meds: albuterol, dextromethorphan-guaiFENesin, loperamide, morphine injection, ondansetron (ZOFRAN) IV, oxyCODONE-acetaminophen  Allergies:   No Known Allergies  Social History:   Social History   Socioeconomic History  . Marital status: Married    Spouse name: Not on file  . Number of children: Not on file  . Years of education: Not on file  . Highest education level: Not on file  Occupational History  . Occupation: retired  Tobacco Use  . Smoking status: Never Smoker  . Smokeless tobacco: Never Used  Vaping Use  .  Vaping Use: Never used  Substance and Sexual Activity  . Alcohol use: Never  . Drug use: Never  . Sexual activity: Not on file  Other Topics Concern  . Not on file  Social History Narrative  . Not on file   Social Determinants of Health   Financial Resource Strain: Not on file  Food Insecurity: Not on file  Transportation Needs: Not on file  Physical Activity: Not on file  Stress: Not on file  Social Connections: Not on file  Intimate Partner Violence: Not on file    Family History:    Family History  Problem Relation Age of Onset  . Heart disease Father      ROS:  Please see the history of present illness.              Review of Systems  Unable to perform ROS: Mental status change                              All other ROS reviewed and negative.     Physical Exam/Data:   Vitals:   10/02/20 0001 10/02/20 0535 10/02/20 0757 10/02/20 1134  BP: (!) 131/58 135/61 (!) 143/66 127/64  Pulse: 86 77 75 68  Resp: '19 18 15 15  '$ Temp: 98.9 F (37.2 C) 98.4 F (36.9 C) 98 F (36.7 C) 98.1 F (36.7 C)  TempSrc: Oral Oral Oral   SpO2: 99% 97% 99% 100%  Weight: 74.8 kg     Height:        Intake/Output Summary (Last 24 hours) at 10/02/2020  1318 Last data filed at 10/02/2020 1000 Gross per 24 hour  Intake 40 ml  Output 200 ml  Net -160 ml   Last 3 Weights 10/02/2020 10/01/2020 10/01/2020  Weight (lbs) 164 lb 14.4 oz 157 lb 3 oz 180 lb  Weight (kg) 74.798 kg 71.3 kg 81.647 kg     Body mass index is 24.35 kg/m.  General: Pale and elderly male, not oriented, nonverbal HEENT: normal Lymph: no adenopathy Neck: JVD difficult to assess due to patient inability to follow instructions Endocrine:  No thryomegaly Vascular: No carotid bruits; FA pulses 1+ bilaterally without bruits  Cardiac:  normal S1, S2; RRR; 1/6 systolic murmur best appreciated along LLSB Lungs: Anterior auscultation only, reduced breath sounds bilaterally, decreased respiratory effort Abd: Very tender to palpation on abdominal exam Ext: no edema, pale /yellow lower extremities Musculoskeletal:  No deformities\ Skin: warm and dry/pale Neuro: Not oriented, nonverbal Psych: Not oriented, nonverbal  EKG:  The EKG was personally reviewed and demonstrates: Accelerated junctional rhythm, 92 BPM, LBBB; subsequent EKGs with accelerated junctional rhythm and PVCs, as well as T wave inversion in inferior lateral leads as seen in prior EKGs and ST depression in V5/V6 Telemetry:  Telemetry was personally reviewed and demonstrates: NSR with first-degree block, 70s to 80s  Relevant CV Studies: Echo 10/01/20 1. Left ventricular ejection fraction, by estimation, is 25 to 30%. The  left ventricle has severely decreased function. The left ventricle  demonstrates global hypokinesis. There is mild left ventricular  hypertrophy. Left ventricular diastolic parameters  are indeterminate.  2. Right ventricular systolic function is low normal. The right  ventricular size is normal. There is moderately elevated pulmonary artery  systolic pressure.  3. Suspect ASD present with left-to-right shunting, incompletely imaged.  Evidence of atrial level shunting detected by color flow  Doppler.  4. The mitral valve is abnormal. Mild  mitral valve regurgitation. No  evidence of mitral stenosis.  5. The aortic valve is tricuspid. There is mild calcification of the  aortic valve. There is mild thickening of the aortic valve. Aortic valve  regurgitation is not visualized. Mild to moderate aortic valve  sclerosis/calcification is present, without any  evidence of aortic stenosis.  6. Mildly dilated pulmonary artery.  7. The inferior vena cava is dilated in size with <50% respiratory  variability, suggesting right atrial pressure of 15 mmHg.   Laboratory Data:  High Sensitivity Troponin:   Recent Labs  Lab 10/01/20 1124 10/01/20 1246 10/01/20 1525 10/01/20 2040 10/02/20 0725  TROPONINIHS 206* 235* 400* 1,511* 2,102*     Chemistry Recent Labs  Lab 10/01/20 1124 10/02/20 1034  NA 140 138  K 4.2 4.8  CL 104 104  CO2 23 21*  GLUCOSE 184* 172*  BUN 48* 58*  CREATININE 3.98* 4.03*  CALCIUM 8.2* 7.6*  GFRNONAA 14* 14*  ANIONGAP 13 13    Recent Labs  Lab 10/01/20 1124  PROT 6.5  ALBUMIN 2.7*  AST 19  ALT 7  ALKPHOS 54  BILITOT 0.7   Hematology Recent Labs  Lab 10/01/20 1124 10/02/20 0725 10/02/20 1124  WBC 19.5* 10.9* 9.8  RBC 3.18* 2.73* 2.89*  HGB 9.7* 8.0* 8.7*  HCT 29.0* 25.0* 26.7*  MCV 91.2 91.6 92.4  MCH 30.5 29.3 30.1  MCHC 33.4 32.0 32.6  RDW 13.5 13.8 14.1  PLT 237 196 176   BNP Recent Labs  Lab 10/01/20 1124  BNP 2,334.2*    DDimer No results for input(s): DDIMER in the last 168 hours.   Radiology/Studies:  CT ABDOMEN PELVIS WO CONTRAST  Result Date: 10/01/2020 CLINICAL DATA:  Abdominal pain and hematuria EXAM: CT ABDOMEN AND PELVIS WITHOUT CONTRAST TECHNIQUE: Multidetector CT imaging of the abdomen and pelvis was performed following the standard protocol without IV contrast. COMPARISON:  Ultrasound from 07/12/2018 FINDINGS: Lower chest: Small left pleural effusion is noted with mild bibasilar atelectasis. Hepatobiliary:  No focal liver abnormality is seen. No gallstones, gallbladder wall thickening, or biliary dilatation. Pancreas: Hypodensity in the region of the head of the pancreas which corresponds to a cystic lesion seen on the prior ultrasound examination. It measures approximately 15 mm. The remainder of the pancreas is within normal limits. Spleen: Normal in size without focal abnormality. Adrenals/Urinary Tract: Left adrenal gland is within normal limits. Right adrenal gland demonstrates a small 15 mm lesion best seen on image number 29 of series 2. Statistically this likely represents an adenoma. Right kidney is well visualized without obstructive change or renal calculi. Left kidney demonstrates evidence of small nonobstructing lower pole renal stone as well as hydronephrosis and hydroureter which extends to the level of the urinary bladder. No obstructing stone is noted. The bladder is decompressed and irregular hyperdense material is noted within the bladder likely representing thrombus given the clinical history. Underlying mass lesion at the bladder trigone may be present given the left-sided hydronephrosis. Stomach/Bowel: No obstructive or inflammatory changes of the colon are seen. The appendix is within normal limits. No small bowel or gastric abnormality is seen. Vascular/Lymphatic: Aortic atherosclerosis. No enlarged abdominal or pelvic lymph nodes. Reproductive: Prostate is unremarkable. Other: No abdominal wall hernia or abnormality. No abdominopelvic ascites. Musculoskeletal: Degenerative changes of lumbar spine are noted. IMPRESSION: Left-sided hydronephrosis is noted without obstructing renal stone. This raises suspicion for possible bladder mass. Additionally hyperdense material is noted within the bladder consistent with thrombus. Direct visualization is recommended for  further evaluation. Nonobstructing lower pole left renal stone. Stable hypodensity within the head of the pancreas unchanged from 2019.  Small right renal lesion likely representing an adenoma but incompletely evaluated on this exam. No other focal abnormality is noted. Electronically Signed   By: Inez Catalina M.D.   On: 10/01/2020 13:25   CT Head Wo Contrast  Result Date: 10/01/2020 CLINICAL DATA:  Mental status changes. EXAM: CT HEAD WITHOUT CONTRAST TECHNIQUE: Contiguous axial images were obtained from the base of the skull through the vertex without intravenous contrast. COMPARISON:  06/14/2020 FINDINGS: Brain: There is no evidence for acute hemorrhage, hydrocephalus, mass lesion, or abnormal extra-axial fluid collection. No definite CT evidence for acute infarction. Diffuse loss of parenchymal volume is consistent with atrophy. Patchy low attenuation in the deep hemispheric and periventricular white matter is nonspecific, but likely reflects chronic microvascular ischemic demyelination. Nonacute left parietooccipital infarct is new since prior. Vascular: No hyperdense vessel or unexpected calcification. Skull: No evidence for fracture. No worrisome lytic or sclerotic lesion. Sinuses/Orbits: The visualized paranasal sinuses and mastoid air cells are clear. Visualized portions of the globes and intraorbital fat are unremarkable. Other: None. IMPRESSION: 1. No acute intracranial abnormality. 2. Nonacute left parietooccipital infarct, new since prior. 3. Atrophy with chronic small vessel white matter ischemic disease. Electronically Signed   By: Misty Stanley M.D.   On: 10/01/2020 13:25   [place] Intraoperative  Result Date: 10/01/2020 CLINICAL DATA:  Ultrasound was provided for use by the ordering physician.  No provider Interpretation or professional fees incurred.    DG Chest Portable 1 View  Result Date: 10/01/2020 CLINICAL DATA:  87 year old male with altered mental status onset this morning. EXAM: PORTABLE CHEST 1 VIEW COMPARISON:  Portable chest 11/25/2018 and earlier. FINDINGS: Portable AP semi upright view at 1137 hours. The patient  is more rotated to the left. Possible cardiomegaly which is new since 2020. Other mediastinal contours are within normal limits. Subsequent left lung base hypo ventilation. But elsewhere Allowing for portable technique the lungs are clear. No pneumothorax. No acute osseous abnormality identified. Paucity of bowel gas in the upper abdomen. IMPRESSION: 1. Questionable new or increased cardiomegaly since 2020, associated left lung base atelectasis. 2. No other acute cardiopulmonary abnormality. Electronically Signed   By: Genevie Ann M.D.   On: 10/01/2020 11:56   ECHOCARDIOGRAM COMPLETE  Result Date: 10/02/2020    ECHOCARDIOGRAM REPORT   Patient Name:   YOVANNY OUTLEY Date of Exam: 10/01/2020 Medical Rec #:  [id number]   Height:       69.0 in Accession #:    [id number]  Weight:       180.0 lb Date of Birth:  [birth year]   BSA:          1.976 m Patient Age:    9 years    BP:           127/61 mmHg Patient Gender: M           HR:           80 bpm. Exam Location:  [facility] Procedure: 2D Echo, Cardiac Doppler and Color Doppler Indications:     NSTEMI I21.4  History:         Patient has prior history of Echocardiogram examinations.                  Stroke; Risk Factors:Hypertension.  Sonographer:     Alyse Low Roar Referring Phys:  Unknown Foley NIU Diagnosing Phys: Nelva Bush MD IMPRESSIONS  1.  Left ventricular ejection fraction, by estimation, is 25 to 30%. The left ventricle has severely decreased function. The left ventricle demonstrates global hypokinesis. There is mild left ventricular hypertrophy. Left ventricular diastolic parameters  are indeterminate.  2. Right ventricular systolic function is low normal. The right ventricular size is normal. There is moderately elevated pulmonary artery systolic pressure.  3. Suspect ASD present with left-to-right shunting, incompletely imaged. Evidence of atrial level shunting detected by color flow Doppler.  4. The mitral valve is abnormal. Mild mitral valve regurgitation. No evidence of  mitral stenosis.  5. The aortic valve is tricuspid. There is mild calcification of the aortic valve. There is mild thickening of the aortic valve. Aortic valve regurgitation is not visualized. Mild to moderate aortic valve sclerosis/calcification is present, without any evidence of aortic stenosis.  6. Mildly dilated pulmonary artery.  7. The inferior vena cava is dilated in size with <50% respiratory variability, suggesting right atrial pressure of 15 mmHg. FINDINGS  Left Ventricle: Left ventricular ejection fraction, by estimation, is 25 to 30%. The left ventricle has severely decreased function. The left ventricle demonstrates global hypokinesis. The left ventricular internal cavity size was normal in size. There is mild left ventricular hypertrophy. Left ventricular diastolic parameters are indeterminate. Right Ventricle: The right ventricular size is normal. No increase in right ventricular wall thickness. Right ventricular systolic function is low normal. There is moderately elevated pulmonary artery systolic pressure. The tricuspid regurgitant velocity  is 3.08 m/s, and with an assumed right atrial pressure of 15 mmHg, the estimated right ventricular systolic pressure is [id number] mmHg. Left Atrium: Left atrial size was normal in size. Right Atrium: Right atrial size was normal in size. Pericardium: Trivial pericardial effusion is present. Presence of pericardial fat pad. Mitral Valve: The mitral valve is abnormal. There is mild thickening of the mitral valve leaflet(s). There is mild calcification of the mitral valve leaflet(s). Mild mitral annular calcification. Mild mitral valve regurgitation. No evidence of mitral valve stenosis. Tricuspid Valve: The tricuspid valve is normal in structure. Tricuspid valve regurgitation is mild. Aortic Valve: The aortic valve is tricuspid. There is mild calcification of the aortic valve. There is mild thickening of the aortic valve. Aortic valve regurgitation is not visualized.  Mild to moderate aortic valve sclerosis/calcification is present, without any evidence of aortic stenosis. Aortic valve peak gradient measures 6.2 mmHg. Pulmonic Valve: The pulmonic valve was normal in structure. Pulmonic valve regurgitation is not visualized. No evidence of pulmonic stenosis. Aorta: The aortic root is normal in size and structure. Pulmonary Artery: The pulmonary artery is mildly dilated. Venous: The inferior vena cava is dilated in size with less than 50% respiratory variability, suggesting right atrial pressure of 15 mmHg. IAS/Shunts: Evidence of atrial level shunting detected by color flow Doppler.  LEFT VENTRICLE PLAX 2D LVIDd:         4.90 cm      Diastology LVIDs:         4.20 cm      LV e' medial:    12.30 cm/s LV PW:         1.20 cm      LV E/e' medial:  9.0 LV IVS:        1.30 cm      LV e' lateral:   11.10 cm/s LVOT diam:     2.10 cm      LV E/e' lateral: 10.0 LVOT Area:     3.46 cm  LV Volumes (MOD) LV vol d, MOD  A2C: 107.0 ml LV vol d, MOD A4C: 106.0 ml LV vol s, MOD A2C: 73.0 ml LV vol s, MOD A4C: 74.3 ml LV SV MOD A2C:     34.0 ml LV SV MOD A4C:     106.0 ml LV SV MOD BP:      31.9 ml RIGHT VENTRICLE RV Mid diam:    2.80 cm RV S prime:     10.90 cm/s TAPSE (M-mode): 1.9 cm LEFT ATRIUM             Index       RIGHT ATRIUM           Index LA diam:        4.00 cm 2.02 cm/m  RA Area:     16.90 cm LA Vol (A2C):   64.6 ml 32.70 ml/m RA Volume:   46.10 ml  23.34 ml/m LA Vol (A4C):   41.0 ml 20.75 ml/m LA Biplane Vol: 51.9 ml 26.27 ml/m  AORTIC VALVE                PULMONIC VALVE AV Area (Vmax): 1.95 cm    PV Vmax:       1.13 m/s AV Vmax:        124.00 cm/s PV Peak grad:  5.1 mmHg AV Peak Grad:   6.2 mmHg LVOT Vmax:      69.80 cm/s  AORTA Ao Root diam: 2.80 cm MITRAL VALVE                TRICUSPID VALVE MV Area (PHT): 6.37 cm     TR Peak grad:   37.9 mmHg MV Decel Time: 119 msec     TR Vmax:        308.00 cm/s MV E velocity: 111.00 cm/s                             SHUNTS                              Systemic Diam: 2.10 cm Nelva Bush MD Electronically signed by Nelva Bush MD Signature Date/Time: 10/02/2020/7:17:48 AM    Final    IR NEPHROSTOMY PLACEMENT LEFT  Result Date: 10/01/2020 INDICATION: 87 year old male presenting with presumed urinary sepsis and non ST elevated myocardial infarction, bladder mass suspected. EXAM: 1. ULTRASOUND GUIDANCE FOR PUNCTURE OF THE LEFT RENAL COLLECTING SYSTEM 2. LEFT PERCUTANEOUS NEPHROSTOMY TUBE PLACEMENT. COMPARISON:  None. MEDICATIONS: The patient is receiving appropriate inpatient antibiotic coverage prior to procedure. ANESTHESIA/SEDATION: Moderate (conscious) sedation was employed during this procedure. A total of Versed 0 mg and Fentanyl 25 mcg was administered intravenously. Moderate Sedation Time: 0 minutes. The patient's level of consciousness and vital signs were monitored continuously by radiology nursing throughout the procedure under my direct supervision. CONTRAST:  Twenty mL Isovue 300 - administered into the renal collecting system FLUOROSCOPY TIME:  0.8 minutes, 123456 mGy COMPLICATIONS: None immediate. PROCEDURE: The procedure, risks, benefits, and alternatives were explained to the patient. Questions regarding the procedure were encouraged and answered. The patient understands and consents to the procedure. A timeout was performed prior to the initiation of the procedure. The left flank region was prepped and draped in the usual sterile fashion and a sterile drape was applied covering the operative field. A sterile gown and sterile gloves were used for the procedure. Local anesthesia was provided with 1% Lidocaine. Ultrasound was used  to localize the left kidney. Under direct ultrasound guidance, a 20 gauge needle was advanced into the renal collecting system. An ultrasound image documentation was performed. Access within the collecting system was confirmed with the efflux of urine followed by limited contrast injection. Over a Nitrex  wire, the tract was dilated with an Accustick stent. Next, under intermittent fluoroscpic guidance and over a short Amplatz wire, the track was dilated ultimately allowing placement of a 10.2 percutaneous nephrostomy catheter which was advanced to the level of the renal pelvis where the coil was formed and locked. Contrast was injected and several spot fluoroscopic images were obtained in various obliquities. The catheter was secured at the skin with a 0 silk retention suture and stat lock device and connected to a gravity bag was placed. Dressings were applied. The patient tolerated procedure well without immediate postprocedural complication. FINDINGS: Ultrasound scanning demonstrates a moderately dilated left collecting system. Under a combination of ultrasound and fluoroscopic guidance, a posterior inferior calix was targeted allowing placement of a 10.2 percutaneous nephrostomy catheter with end coiled and locked within the renal pelvis. Contrast injection confirmed appropriate positioning. Contrast does not travel beyond the mid ureter. IMPRESSION: Successful ultrasound and fluoroscopic guided placement of a left sided 10.2 [place] PCN. PLAN: Interventional Radiology will arrange for routine check and exchange of indwelling nephrostomy in 4-6 weeks. Ruthann Cancer, MD [facility] Electronically Signed   By: Ruthann Cancer MD   On: 10/01/2020 16:21     Assessment and Plan:   NSTEMI Elevated HS Tn --Unable to assess if chest pain.  EKG shows new LBBB and previous TWI in inferior lateral leads, now more pronounced.  Most recent echo shows reduced EF 25 to 30% with LV global hypokinesis.  Further ischemic work-up recommended for elevated troponin and reduced EF; however, patient is not an ideal candidate for catheterization given his comorbid conditions to include severe anemia and worsening renal function.  Medical management recommended.  Continue  IV heparin and ASA 81 mg as Hgb allows.  Daily CBC.  Hemoglobin 8.7, hematocrit 26.7.    Cose monitoring of H&H.  Started carvedilol 3.125 mg twice daily.  Continue statin for risk factor modification.  LFTs WNL.  LDL 58 and at goal of below 70.  Escalate medical therapy/GDMT as tolerated.    Recommend palliative care consult to determine goals of care.  Acute HFrEF Pulmonary hypertension, severely elevated right heart pressures --Most recent echo shows EF decreased to 25 to 30% and elevated right heart pressures with RVSP 52.9 mmHg.  ASD suspected per echo.  As above, further ischemic work-up recommended of reduced EF but patient is not an ideal candidate for catheterization at this time.  Medical management recommended.  No diuresis at this time, given renal function.  Recommend consulting nephrology, as he may need temporary hemodialysis to assist with volume status with consideration of worsening renal function.  Discontinued his IV fluids.  Avoid IV fluids, given his reduced EF.  Continue carvedilol 3.125 mg twice daily.    Given his reduced EF, no Cardizem/nondihydropyridine CCB.  Escalate GDMT as renal function allows.  Current renal function precludes escalation of GDMT with ACE/ARB/Entresto/spironolactone.  If renal function improves, recommend addition of these medications.   As above, recommend palliative care consult to determine goals of care.  Could consider V/Q scan once goals determined - defer to IM.   CKD/ESRD --Cr 4.03, potassium 4.8 --Recommend consult nephrology. --Avoid contrast procedures. --Renally dose nephrotoxins.  Hyperkalemia --  Monitor closely.  Anemia --Daily CBC.   --Transfuse below 8.0. --Monitor H&H closely on IV heparin, ASA. --Most recent hemoglobin 8.7, hematocrit 26.7  History of CVA --History of CVA.  No acute findings on recent CT-  CT head with nonacute L parietoccipital infarct, new since prior.  Per IM.     TIMI Risk  Score for Unstable Angina or Non-ST Elevation MI:   The patient's TIMI risk score is 3, which indicates a 13% risk of all cause mortality, new or recurrent myocardial infarction or need for urgent revascularization in the next 14 days.      For questions or updates, please contact [place] Please consult [email or web address] for contact info under    Signed, Arvil Chaco, PA-C  10/02/2020 1:18 PM

## 2020-10-02 NOTE — Consult Note (Signed)
ANTICOAGULATION CONSULT NOTE - Consult  Pharmacy Consult for heparin Indication: chest pain/ACS (NSTEMI)  No Known Allergies  Patient Measurements: Height: '5\' 9"'$  (175.3 cm) Weight: 74.8 kg (164 lb 14.4 oz) IBW/kg (Calculated) : 70.7 Heparin Dosing Weight: 81.6 kg  Vital Signs: Temp: 98.9 F (37.2 C) (03/01 0001) Temp Source: Oral (03/01 0001) BP: 131/58 (03/01 0001) Pulse Rate: 86 (03/01 0001)  Labs: Recent Labs    10/01/20 1124 10/01/20 1246 10/01/20 1525 10/01/20 2040 10/01/20 2248  HGB 9.7*  --   --   --   --   HCT 29.0*  --   --   --   --   PLT 237  --   --   --   --   APTT 41*  --   --   --   --   LABPROT 15.2  --   --   --   --   INR 1.2  --   --   --   --   HEPARINUNFRC  --   --   --   --  0.17*  CREATININE 3.98*  --   --   --   --   TROPONINIHS 206* 235* 400* 1,511*  --     Estimated Creatinine Clearance: 13.8 mL/min (A) (by C-G formula based on SCr of 3.98 mg/dL (H)).   Medications:  PTA: Plavix. No other AC or APT PTA per fill history or care everywhere.    Assessment: 87 yo Male with PMH sig for HTN, HLD, stroke/TIA, vasc dementia, CKD3, MDD presenting with AMS. ISO EKG changes and troponin trending upward pharmacy has been consulted for the management of hep gtt. Patient underwent IR procedure designated as HIGH BLEEDING risk. Per radiology note: okay to being anticoagulation immediately.   Baseline: Hgb 9.7 Hct 29 Plt237 Trop: 206>235 PT/INR: ordered/pending APTT: ordered/pending  Date Time HL Rate/Comment   Goal of Therapy:  Heparin level 0.3-0.7 units/ml Monitor platelets by anticoagulation protocol: Yes   Plan:  2/28:  HL @ 2248 = 0.17 Will order Heparin 2400 units IV X 1 bolus and increase drip rate to 1300 units/hr.  Will recheck HL 8 hrs after rate change on 3/1 @ 0800.   Orene Desanctis, PharmD Clinical Pharmacist  10/02/2020,1:05 AM

## 2020-10-02 NOTE — Plan of Care (Signed)
Pt admitted from ED. VSS, NAD observed. See assessment documentation. Unable to complete admission screening questions d/t pt poor historian and history of dementia. Left Nephrostomy tube tube draining dark red cloudy urine with sediment, flushed with 10cc NS. Primafit placed on pt. Bed alarm on for safety. Ian Rainwater NP updated on care and labs. See documentation.

## 2020-10-02 NOTE — Progress Notes (Addendum)
PROGRESS NOTE    Tayvian Salgueiro  [id number] DOB: [birth year] DOA: 10/01/2020 PCP: Albina Billet, MD   Chief complaint.  Altered mental status. Brief Narrative:  Johniel Garbarini is a 87 y.o. male with medical history significant of HTN, HLD, stroke/TIA, dementia, CKD-III, depression, presents with AMS, hematuria. CT abdomen/pelvis showed a possible blood clots in the bladder, with the possibility of bladder mass.  Patient also has hydronephrosis.  Emergent nephrostomy was performed by IR.  Urine cultures so far growing gram-negative rods, blood culture positive for Klebsiella pneumonia.  Patient has been treated with Rocephin. Patient also had elevated troponin, peaked at 2102.  Has been seen by cardiology, not a good candidate for heart cath due to multiple medical problems including renal failure, and bacteremia.   Assessment & Plan:   Principal Problem:   Acute pyelonephritis Active Problems:   TIA (transient ischemic attack)   Hypertension associated with diabetes ([place])   Depression due to dementia [facility])   NSTEMI (non-ST elevated myocardial infarction) ([place])   Type II diabetes mellitus with renal manifestations ([place])   Acute renal failure superimposed on stage 3a chronic kidney disease ([place])   HLD (hyperlipidemia)   Dementia (HCC)   Hydronephrosis of left kidney   Acute respiratory failure with hypoxia (HCC)   Severe sepsis with septic shock ([place])  #1.  Severe sepsis with septic shock secondary to pyelonephritis. Klebsiella pneumoniae septicemia. Acute pyelonephritis secondary to obstruction secondary to Klebsiella pneumonia. Left hydronephrosis likely secondary to bladder mass. Patient is status post left nephrostomy.  Currently treated with Rocephin. Patient had received IV fluid bolus, due to acute renal failure, will continue fluids for now. Urology consult obtained for possible bladder mass.  #2.  Non-STEMI. Acute systolic congestive heart failure with ejection fraction 25  to 30%. Moderate pulmonary hypertension. Acute hypoxemic respiratory failure. Patient has significant elevation of troponin, followed by cardiology. He is not a good candidate for heart cath due to medical conditions. Patient had echocardiogram from 2019, ejection fraction was normal. I am not certain about the patient volume status, she has elevated BNP, but this is associated with worsening renal function.  Chest x-ray does not seem to have increased pulmonary edema.  He is currently on IV fluids for acute kidney injury.  I will consult nephrology, specifically requested to look at the patient volume status to the study if needing fluids versus diuretics. Continue oxygen treatment. Patient still on IV heparin for non-STEMI.  #3.  Acute kidney injury on chronic kidney disease stage IIIa versus stage IV. Due to patient previous record, new early 2020, creatinine was 1.5, later the year creatinine increased to 3.5, this might reflect an obstruction from bladder mass. Patient is status post left nephrostomy, will monitor renal function until patient renal function reaches baseline. Consult nephrology.  4.  Type 2 diabetes. Well controlled.  5.  Essential hypertension. Continue to monitor blood pressure.  6.  Parkinson disease with dementia. Acute metabolic encephalopathy Patient still very confused not able to communicate.  CT head showed atrophy with old infarct, no acute changes. Continue home medicines.  7.  Anemia. Check iron B12 level.     DVT prophylaxis: IV heparin Code Status: Partial Family Communication: daughter updated  Disposition Plan:  .   Status is: Inpatient  Remains inpatient appropriate because:Inpatient level of care appropriate due to severity of illness   Dispo: The patient is from: Home              Anticipated d/c is  to: Home              Patient currently is not medically stable to d/c.   Difficult to place patient No        I/O last 3  completed shifts: In: 16 [P.O.:30; I.V.:10] Out: 200 [Drains:200] Total I/O In: 0  Out: 250 [Urine:50; Drains:200]     Consultants:   Cardiology, nephrology, urologist  Procedures: Left nephrostomy.  Antimicrobials:  Rocephin  Subjective: Patient is quite confused, not able to communicate.  But no agitation. Still on 2 L oxygen, not in respiratory distress. Does not seem to have any pain or nausea vomiting. No fever or chills.   Objective: Vitals:   10/02/20 0001 10/02/20 0535 10/02/20 0757 10/02/20 1134  BP: (!) 131/58 135/61 (!) 143/66 127/64  Pulse: 86 77 75 68  Resp: '19 18 15 15  '$ Temp: 98.9 F (37.2 C) 98.4 F (36.9 C) 98 F (36.7 C) 98.1 F (36.7 C)  TempSrc: Oral Oral Oral   SpO2: 99% 97% 99% 100%  Weight: 74.8 kg     Height:        Intake/Output Summary (Last 24 hours) at 10/02/2020 1328 Last data filed at 10/02/2020 1319 Gross per 24 hour  Intake 40 ml  Output 450 ml  Net -410 ml   Filed Weights   10/01/20 1118 10/01/20 2054 10/02/20 0001  Weight: 81.6 kg 71.3 kg 74.8 kg    Examination:  General exam: Appears calm and comfortable, ill appearing  Respiratory system: Clear to auscultation. Respiratory effort normal. Cardiovascular system: S1 & S2 heard, RRR. No JVD, murmurs, rubs, gallops or clicks. No pedal edema. Gastrointestinal system: Abdomen is nondistended, soft and nontender. No organomegaly or masses felt. Normal bowel sounds heard. Central nervous system: Drowsy, minimally responsive Extremities: Symmetric  Skin: No rashes, lesions or ulcers     Data Reviewed: I have personally reviewed following labs and imaging studies  CBC: Recent Labs  Lab 10/01/20 1124 10/02/20 0725 10/02/20 1124  WBC 19.5* 10.9* 9.8  NEUTROABS 18.0*  --  8.5*  HGB 9.7* 8.0* 8.7*  HCT 29.0* 25.0* 26.7*  MCV 91.2 91.6 92.4  PLT 237 196 0000000   Basic Metabolic Panel: Recent Labs  Lab 10/01/20 1124 10/02/20 1034  NA 140 138  K 4.2 4.8  CL 104 104   CO2 23 21*  GLUCOSE 184* 172*  BUN 48* 58*  CREATININE 3.98* 4.03*  CALCIUM 8.2* 7.6*   GFR: Estimated Creatinine Clearance: 13.6 mL/min (A) (by C-G formula based on SCr of 4.03 mg/dL (H)). Liver Function Tests: Recent Labs  Lab 10/01/20 1124  AST 19  ALT 7  ALKPHOS 54  BILITOT 0.7  PROT 6.5  ALBUMIN 2.7*   No results for input(s): LIPASE, AMYLASE in the last 168 hours. No results for input(s): AMMONIA in the last 168 hours. Coagulation Profile: Recent Labs  Lab 10/01/20 1124  INR 1.2   Cardiac Enzymes: No results for input(s): CKTOTAL, CKMB, CKMBINDEX, TROPONINI in the last 168 hours. BNP (last 3 results) No results for input(s): PROBNP in the last 8760 hours. HbA1C: Recent Labs    10/02/20 0725  HGBA1C 6.6*   CBG: Recent Labs  Lab 10/01/20 1720 10/01/20 2108 10/02/20 0756 10/02/20 1133  GLUCAP 175* 151* 169* 148*   Lipid Profile: Recent Labs    10/02/20 0725  CHOL 112  HDL 36*  LDLCALC 58  TRIG 89  CHOLHDL 3.1   Thyroid Function Tests: No results for input(s): TSH, T4TOTAL,  FREET4, T3FREE, THYROIDAB in the last 72 hours. Anemia Panel: No results for input(s): VITAMINB12, FOLATE, FERRITIN, TIBC, IRON, RETICCTPCT in the last 72 hours. Sepsis Labs: Recent Labs  Lab 10/01/20 1105 10/01/20 1525 10/01/20 2333 10/02/20 0145  PROCALCITON  --  71.94  --   --   LATICACIDVEN 5.1* 3.7* 2.9* 1.9    Recent Results (from the past 240 hour(s))  Urine Culture     Status: Abnormal (Preliminary result)   Collection Time: 10/01/20 11:24 AM   Specimen: Urine, Random  Result Value Ref Range Status   Specimen Description   Final    URINE, RANDOM Performed at [facility], [address] [place], [place] [address]    Special Requests   Final    NONE Performed at [facility], [address] [place], [place] [address]    Culture (A)  Final    >=100,000 COLONIES/mL KLEBSIELLA PNEUMONIAE SUSCEPTIBILITIES TO FOLLOW Performed at  [facility], [place] [address], [place], [place] [address]    Report Status PENDING  Incomplete  Blood culture (routine x 2)     Status: None (Preliminary result)   Collection Time: 10/01/20 11:24 AM   Specimen: BLOOD  Result Value Ref Range Status   Specimen Description BLOOD RIGHT ANTECUBITAL  Final   Special Requests   Final    BOTTLES DRAWN AEROBIC AND ANAEROBIC Blood Culture adequate volume   Culture  Setup Time   Final    GRAM NEGATIVE RODS IN BOTH AEROBIC AND ANAEROBIC BOTTLES Organism ID to follow CRITICAL RESULT CALLED TO, READ BACK BY AND VERIFIED WITH: JASON ROBBINS '@0255'$  10/02/20 RH Performed at [facility], [address] [place],  [address]    Culture GRAM NEGATIVE RODS  Final   Report Status PENDING  Incomplete  Blood Culture ID Panel (Reflexed)     Status: Abnormal   Collection Time: 10/01/20 11:24 AM  Result Value Ref Range Status   Enterococcus faecalis NOT DETECTED NOT DETECTED Final   Enterococcus Faecium NOT DETECTED NOT DETECTED Final   Listeria monocytogenes NOT DETECTED NOT DETECTED Final   Staphylococcus species NOT DETECTED NOT DETECTED Final   Staphylococcus aureus (BCID) NOT DETECTED NOT DETECTED Final   Staphylococcus epidermidis NOT DETECTED NOT DETECTED Final   Staphylococcus lugdunensis NOT DETECTED NOT DETECTED Final   Streptococcus species NOT DETECTED NOT DETECTED Final   Streptococcus agalactiae NOT DETECTED NOT DETECTED Final   Streptococcus pneumoniae NOT DETECTED NOT DETECTED Final   Streptococcus pyogenes NOT DETECTED NOT DETECTED Final   A.calcoaceticus-baumannii NOT DETECTED NOT DETECTED Final   Bacteroides fragilis NOT DETECTED NOT DETECTED Final   Enterobacterales DETECTED (A) NOT DETECTED Final    Comment: Enterobacterales represent a large order of gram negative bacteria, not a single organism. CRITICAL RESULT CALLED TO, READ BACK BY AND VERIFIED WITH: JASON ROBBINS '@0255'$  10/02/20 RH    Enterobacter cloacae  complex NOT DETECTED NOT DETECTED Final   Escherichia coli NOT DETECTED NOT DETECTED Final   Klebsiella aerogenes NOT DETECTED NOT DETECTED Final   Klebsiella oxytoca NOT DETECTED NOT DETECTED Final   Klebsiella pneumoniae DETECTED (A) NOT DETECTED Final    Comment: CRITICAL RESULT CALLED TO, READ BACK BY AND VERIFIED WITH: JASON ROBBINS '@0255'$  10/02/20 RH    Proteus species NOT DETECTED NOT DETECTED Final   Salmonella species NOT DETECTED NOT DETECTED Final   Serratia marcescens NOT DETECTED NOT DETECTED Final   Haemophilus influenzae NOT DETECTED NOT DETECTED Final   Neisseria meningitidis  NOT DETECTED NOT DETECTED Final   Pseudomonas aeruginosa NOT DETECTED NOT DETECTED Final   Stenotrophomonas maltophilia NOT DETECTED NOT DETECTED Final   Candida albicans NOT DETECTED NOT DETECTED Final   Candida auris NOT DETECTED NOT DETECTED Final   Candida glabrata NOT DETECTED NOT DETECTED Final   Candida krusei NOT DETECTED NOT DETECTED Final   Candida parapsilosis NOT DETECTED NOT DETECTED Final   Candida tropicalis NOT DETECTED NOT DETECTED Final   Cryptococcus neoformans/gattii NOT DETECTED NOT DETECTED Final   CTX-M ESBL NOT DETECTED NOT DETECTED Final   Carbapenem resistance IMP NOT DETECTED NOT DETECTED Final   Carbapenem resistance KPC NOT DETECTED NOT DETECTED Final   Carbapenem resistance NDM NOT DETECTED NOT DETECTED Final   Carbapenem resist OXA 48 LIKE NOT DETECTED NOT DETECTED Final   Carbapenem resistance VIM NOT DETECTED NOT DETECTED Final    Comment: Performed at [facility], [place], [place] [address]  SARS CORONAVIRUS 2 (TAT 6-24 HRS) Nasopharyngeal Nasopharyngeal Swab     Status: None   Collection Time: 10/01/20 12:36 PM   Specimen: Nasopharyngeal Swab  Result Value Ref Range Status   SARS Coronavirus 2 NEGATIVE NEGATIVE Final    Comment: (NOTE) SARS-CoV-2 target nucleic acids are NOT DETECTED.  The SARS-CoV-2 RNA is generally detectable in  upper and lower respiratory specimens during the acute phase of infection. Negative results do not preclude SARS-CoV-2 infection, do not rule out co-infections with other pathogens, and should not be used as the sole basis for treatment or other patient management decisions. Negative results must be combined with clinical observations, patient history, and epidemiological information. The expected result is Negative.  Fact Sheet for Patients: SugarRoll.be  Fact Sheet for Healthcare Providers: https://www.woods-mathews.com/  This test is not yet approved or cleared by the [place] FDA and  has been authorized for detection and/or diagnosis of SARS-CoV-2 by FDA under an Emergency Use Authorization (EUA). This EUA will remain  in effect (meaning this test can be used) for the duration of the COVID-19 declaration under Se ction 564(b)(1) of the Act, 21 U.S.C. section 360bbb-3(b)(1), unless the authorization is terminated or revoked sooner.  Performed at [facility], [place] [address], [place], [place] [address]   Blood culture (routine x 2)     Status: None (Preliminary result)   Collection Time: 10/01/20 12:46 PM   Specimen: BLOOD  Result Value Ref Range Status   Specimen Description BLOOD BOTTOM LEFT HAND  Final   Special Requests   Final    BOTTLES DRAWN AEROBIC AND ANAEROBIC Blood Culture adequate volume   Culture   Final    NO GROWTH < 24 HOURS Performed at [facility], [address], [place], [place] [address]    Report Status PENDING  Incomplete  Urine culture     Status: None (Preliminary result)   Collection Time: 10/01/20  4:08 PM   Specimen: Urine, Catheterized  Result Value Ref Range Status   Specimen Description URINE, CATHETERIZED  Final   Special Requests   Final    SYRINGE Performed at [facility], [place] [address] [place], [place] [address]    Culture PENDING  Incomplete   Report Status  PENDING  Incomplete         Radiology Studies: CT ABDOMEN PELVIS WO CONTRAST  Result Date: 10/01/2020 CLINICAL DATA:  Abdominal pain and hematuria EXAM: CT ABDOMEN AND PELVIS WITHOUT CONTRAST TECHNIQUE: Multidetector CT imaging of the abdomen and pelvis was  performed following the standard protocol without IV contrast. COMPARISON:  Ultrasound from 07/12/2018 FINDINGS: Lower chest: Small left pleural effusion is noted with mild bibasilar atelectasis. Hepatobiliary: No focal liver abnormality is seen. No gallstones, gallbladder wall thickening, or biliary dilatation. Pancreas: Hypodensity in the region of the head of the pancreas which corresponds to a cystic lesion seen on the prior ultrasound examination. It measures approximately 15 mm. The remainder of the pancreas is within normal limits. Spleen: Normal in size without focal abnormality. Adrenals/Urinary Tract: Left adrenal gland is within normal limits. Right adrenal gland demonstrates a small 15 mm lesion best seen on image number 29 of series 2. Statistically this likely represents an adenoma. Right kidney is well visualized without obstructive change or renal calculi. Left kidney demonstrates evidence of small nonobstructing lower pole renal stone as well as hydronephrosis and hydroureter which extends to the level of the urinary bladder. No obstructing stone is noted. The bladder is decompressed and irregular hyperdense material is noted within the bladder likely representing thrombus given the clinical history. Underlying mass lesion at the bladder trigone may be present given the left-sided hydronephrosis. Stomach/Bowel: No obstructive or inflammatory changes of the colon are seen. The appendix is within normal limits. No small bowel or gastric abnormality is seen. Vascular/Lymphatic: Aortic atherosclerosis. No enlarged abdominal or pelvic lymph nodes. Reproductive: Prostate is unremarkable. Other: No abdominal wall hernia or abnormality. No  abdominopelvic ascites. Musculoskeletal: Degenerative changes of lumbar spine are noted. IMPRESSION: Left-sided hydronephrosis is noted without obstructing renal stone. This raises suspicion for possible bladder mass. Additionally hyperdense material is noted within the bladder consistent with thrombus. Direct visualization is recommended for further evaluation. Nonobstructing lower pole left renal stone. Stable hypodensity within the head of the pancreas unchanged from 2019. Small right renal lesion likely representing an adenoma but incompletely evaluated on this exam. No other focal abnormality is noted. Electronically Signed   By: Inez Catalina M.D.   On: 10/01/2020 13:25   CT Head Wo Contrast  Result Date: 10/01/2020 CLINICAL DATA:  Mental status changes. EXAM: CT HEAD WITHOUT CONTRAST TECHNIQUE: Contiguous axial images were obtained from the base of the skull through the vertex without intravenous contrast. COMPARISON:  06/14/2020 FINDINGS: Brain: There is no evidence for acute hemorrhage, hydrocephalus, mass lesion, or abnormal extra-axial fluid collection. No definite CT evidence for acute infarction. Diffuse loss of parenchymal volume is consistent with atrophy. Patchy low attenuation in the deep hemispheric and periventricular white matter is nonspecific, but likely reflects chronic microvascular ischemic demyelination. Nonacute left parietooccipital infarct is new since prior. Vascular: No hyperdense vessel or unexpected calcification. Skull: No evidence for fracture. No worrisome lytic or sclerotic lesion. Sinuses/Orbits: The visualized paranasal sinuses and mastoid air cells are clear. Visualized portions of the globes and intraorbital fat are unremarkable. Other: None. IMPRESSION: 1. No acute intracranial abnormality. 2. Nonacute left parietooccipital infarct, new since prior. 3. Atrophy with chronic small vessel white matter ischemic disease. Electronically Signed   By: Misty Stanley M.D.   On:  10/01/2020 13:25   [place] Intraoperative  Result Date: 10/01/2020 CLINICAL DATA:  Ultrasound was provided for use by the ordering physician.  No provider Interpretation or professional fees incurred.    DG Chest Portable 1 View  Result Date: 10/01/2020 CLINICAL DATA:  87 year old male with altered mental status onset this morning. EXAM: PORTABLE CHEST 1 VIEW COMPARISON:  Portable chest 11/25/2018 and earlier. FINDINGS: Portable AP semi upright view at 1137 hours. The patient is more rotated to the  left. Possible cardiomegaly which is new since 2020. Other mediastinal contours are within normal limits. Subsequent left lung base hypo ventilation. But elsewhere Allowing for portable technique the lungs are clear. No pneumothorax. No acute osseous abnormality identified. Paucity of bowel gas in the upper abdomen. IMPRESSION: 1. Questionable new or increased cardiomegaly since 2020, associated left lung base atelectasis. 2. No other acute cardiopulmonary abnormality. Electronically Signed   By: Genevie Ann M.D.   On: 10/01/2020 11:56   ECHOCARDIOGRAM COMPLETE  Result Date: 10/02/2020    ECHOCARDIOGRAM REPORT   Patient Name:   STUART CALLAIS Date of Exam: 10/01/2020 Medical Rec #:  [id number]   Height:       69.0 in Accession #:    [id number]  Weight:       180.0 lb Date of Birth:  February 13, 1934   BSA:          1.976 m Patient Age:    29 years    BP:           127/61 mmHg Patient Gender: M           HR:           80 bpm. Exam Location:  [facility] Procedure: 2D Echo, Cardiac Doppler and Color Doppler Indications:     NSTEMI I21.4  History:         Patient has prior history of Echocardiogram examinations.                  Stroke; Risk Factors:Hypertension.  Sonographer:     Alyse Low Roar Referring Phys:  Unknown Foley NIU Diagnosing Phys: Nelva Bush MD IMPRESSIONS  1. Left ventricular ejection fraction, by estimation, is 25 to 30%. The left ventricle has severely decreased function. The left ventricle demonstrates global  hypokinesis. There is mild left ventricular hypertrophy. Left ventricular diastolic parameters  are indeterminate.  2. Right ventricular systolic function is low normal. The right ventricular size is normal. There is moderately elevated pulmonary artery systolic pressure.  3. Suspect ASD present with left-to-right shunting, incompletely imaged. Evidence of atrial level shunting detected by color flow Doppler.  4. The mitral valve is abnormal. Mild mitral valve regurgitation. No evidence of mitral stenosis.  5. The aortic valve is tricuspid. There is mild calcification of the aortic valve. There is mild thickening of the aortic valve. Aortic valve regurgitation is not visualized. Mild to moderate aortic valve sclerosis/calcification is present, without any evidence of aortic stenosis.  6. Mildly dilated pulmonary artery.  7. The inferior vena cava is dilated in size with <50% respiratory variability, suggesting right atrial pressure of 15 mmHg. FINDINGS  Left Ventricle: Left ventricular ejection fraction, by estimation, is 25 to 30%. The left ventricle has severely decreased function. The left ventricle demonstrates global hypokinesis. The left ventricular internal cavity size was normal in size. There is mild left ventricular hypertrophy. Left ventricular diastolic parameters are indeterminate. Right Ventricle: The right ventricular size is normal. No increase in right ventricular wall thickness. Right ventricular systolic function is low normal. There is moderately elevated pulmonary artery systolic pressure. The tricuspid regurgitant velocity  is 3.08 m/s, and with an assumed right atrial pressure of 15 mmHg, the estimated right ventricular systolic pressure is [id number] mmHg. Left Atrium: Left atrial size was normal in size. Right Atrium: Right atrial size was normal in size. Pericardium: Trivial pericardial effusion is present. Presence of pericardial fat pad. Mitral Valve: The mitral valve is abnormal. There is mild  thickening of the mitral valve  leaflet(s). There is mild calcification of the mitral valve leaflet(s). Mild mitral annular calcification. Mild mitral valve regurgitation. No evidence of mitral valve stenosis. Tricuspid Valve: The tricuspid valve is normal in structure. Tricuspid valve regurgitation is mild. Aortic Valve: The aortic valve is tricuspid. There is mild calcification of the aortic valve. There is mild thickening of the aortic valve. Aortic valve regurgitation is not visualized. Mild to moderate aortic valve sclerosis/calcification is present, without any evidence of aortic stenosis. Aortic valve peak gradient measures 6.2 mmHg. Pulmonic Valve: The pulmonic valve was normal in structure. Pulmonic valve regurgitation is not visualized. No evidence of pulmonic stenosis. Aorta: The aortic root is normal in size and structure. Pulmonary Artery: The pulmonary artery is mildly dilated. Venous: The inferior vena cava is dilated in size with less than 50% respiratory variability, suggesting right atrial pressure of 15 mmHg. IAS/Shunts: Evidence of atrial level shunting detected by color flow Doppler.  LEFT VENTRICLE PLAX 2D LVIDd:         4.90 cm      Diastology LVIDs:         4.20 cm      LV e' medial:    12.30 cm/s LV PW:         1.20 cm      LV E/e' medial:  9.0 LV IVS:        1.30 cm      LV e' lateral:   11.10 cm/s LVOT diam:     2.10 cm      LV E/e' lateral: 10.0 LVOT Area:     3.46 cm  LV Volumes (MOD) LV vol d, MOD A2C: 107.0 ml LV vol d, MOD A4C: 106.0 ml LV vol s, MOD A2C: 73.0 ml LV vol s, MOD A4C: 74.3 ml LV SV MOD A2C:     34.0 ml LV SV MOD A4C:     106.0 ml LV SV MOD BP:      31.9 ml RIGHT VENTRICLE RV Mid diam:    2.80 cm RV S prime:     10.90 cm/s TAPSE (M-mode): 1.9 cm LEFT ATRIUM             Index       RIGHT ATRIUM           Index LA diam:        4.00 cm 2.02 cm/m  RA Area:     16.90 cm LA Vol (A2C):   64.6 ml 32.70 ml/m RA Volume:   46.10 ml  23.34 ml/m LA Vol (A4C):   41.0 ml 20.75 ml/m  LA Biplane Vol: 51.9 ml 26.27 ml/m  AORTIC VALVE                PULMONIC VALVE AV Area (Vmax): 1.95 cm    PV Vmax:       1.13 m/s AV Vmax:        124.00 cm/s PV Peak grad:  5.1 mmHg AV Peak Grad:   6.2 mmHg LVOT Vmax:      69.80 cm/s  AORTA Ao Root diam: 2.80 cm MITRAL VALVE                TRICUSPID VALVE MV Area (PHT): 6.37 cm     TR Peak grad:   37.9 mmHg MV Decel Time: 119 msec     TR Vmax:        308.00 cm/s MV E velocity: 111.00 cm/s  SHUNTS                             Systemic Diam: 2.10 cm Nelva Bush MD Electronically signed by Nelva Bush MD Signature Date/Time: 10/02/2020/7:17:48 AM    Final    IR NEPHROSTOMY PLACEMENT LEFT  Result Date: 10/01/2020 INDICATION: 87 year old male presenting with presumed urinary sepsis and non ST elevated myocardial infarction, bladder mass suspected. EXAM: 1. ULTRASOUND GUIDANCE FOR PUNCTURE OF THE LEFT RENAL COLLECTING SYSTEM 2. LEFT PERCUTANEOUS NEPHROSTOMY TUBE PLACEMENT. COMPARISON:  None. MEDICATIONS: The patient is receiving appropriate inpatient antibiotic coverage prior to procedure. ANESTHESIA/SEDATION: Moderate (conscious) sedation was employed during this procedure. A total of Versed 0 mg and Fentanyl 25 mcg was administered intravenously. Moderate Sedation Time: 0 minutes. The patient's level of consciousness and vital signs were monitored continuously by radiology nursing throughout the procedure under my direct supervision. CONTRAST:  Twenty mL Isovue 300 - administered into the renal collecting system FLUOROSCOPY TIME:  0.8 minutes, 123456 mGy COMPLICATIONS: None immediate. PROCEDURE: The procedure, risks, benefits, and alternatives were explained to the patient. Questions regarding the procedure were encouraged and answered. The patient understands and consents to the procedure. A timeout was performed prior to the initiation of the procedure. The left flank region was prepped and draped in the usual sterile fashion and a  sterile drape was applied covering the operative field. A sterile gown and sterile gloves were used for the procedure. Local anesthesia was provided with 1% Lidocaine. Ultrasound was used to localize the left kidney. Under direct ultrasound guidance, a 20 gauge needle was advanced into the renal collecting system. An ultrasound image documentation was performed. Access within the collecting system was confirmed with the efflux of urine followed by limited contrast injection. Over a Nitrex wire, the tract was dilated with an Accustick stent. Next, under intermittent fluoroscpic guidance and over a short Amplatz wire, the track was dilated ultimately allowing placement of a 10.2 percutaneous nephrostomy catheter which was advanced to the level of the renal pelvis where the coil was formed and locked. Contrast was injected and several spot fluoroscopic images were obtained in various obliquities. The catheter was secured at the skin with a 0 silk retention suture and stat lock device and connected to a gravity bag was placed. Dressings were applied. The patient tolerated procedure well without immediate postprocedural complication. FINDINGS: Ultrasound scanning demonstrates a moderately dilated left collecting system. Under a combination of ultrasound and fluoroscopic guidance, a posterior inferior calix was targeted allowing placement of a 10.2 percutaneous nephrostomy catheter with end coiled and locked within the renal pelvis. Contrast injection confirmed appropriate positioning. Contrast does not travel beyond the mid ureter. IMPRESSION: Successful ultrasound and fluoroscopic guided placement of a left sided 10.2 [place] PCN. PLAN: Interventional Radiology will arrange for routine check and exchange of indwelling nephrostomy in 4-6 weeks. Ruthann Cancer, MD [facility] Radiology Electronically Signed   By: Ruthann Cancer MD   On: 10/01/2020 16:21        Scheduled  Meds: . aspirin EC  81 mg Oral Daily  . atorvastatin  40 mg Oral Daily  . carbidopa-levodopa  1 tablet Oral TID  . carvedilol  3.125 mg Oral BID WC  . divalproex  250 mg Oral BID  . donepezil  10 mg Oral QHS  . escitalopram  5 mg Oral Daily  . insulin aspart  0-5 Units Subcutaneous QHS  . insulin aspart  0-9 Units Subcutaneous TID WC  . iodixanol  50 mL Other Once  . sodium chloride flush  5 mL Intracatheter Q8H   Continuous Infusions: . cefTRIAXone (ROCEPHIN)  IV 2 g (10/02/20 0511)  . heparin 1,300 Units/hr (10/02/20 0015)     LOS: 1 day    Time spent: 34 minutes    Sharen Hones, MD [facility]   To contact the attending provider between 7A-7P or the covering provider during after hours 7P-7A, please log into the web site [email or web address] and access using universal [place] password for that web site. If you do not have the password, please call the hospital operator.  10/02/2020, 1:28 PM

## 2020-10-03 DIAGNOSIS — N10 Acute tubulo-interstitial nephritis: Secondary | ICD-10-CM | POA: Diagnosis not present

## 2020-10-03 DIAGNOSIS — I5020 Unspecified systolic (congestive) heart failure: Secondary | ICD-10-CM

## 2020-10-03 LAB — BASIC METABOLIC PANEL
Anion gap: 11 (ref 5–15)
BUN: 69 mg/dL — ABNORMAL HIGH (ref 8–23)
CO2: 24 mmol/L (ref 22–32)
Calcium: 7.6 mg/dL — ABNORMAL LOW (ref 8.9–10.3)
Chloride: 107 mmol/L (ref 98–111)
Creatinine, Ser: 3.89 mg/dL — ABNORMAL HIGH (ref 0.61–1.24)
GFR, Estimated: 15 mL/min — ABNORMAL LOW (ref >60)
Glucose, Bld: 132 mg/dL — ABNORMAL HIGH (ref 70–99)
Potassium: 4.6 mmol/L (ref 3.5–5.1)
Sodium: 142 mmol/L (ref 135–145)

## 2020-10-03 LAB — GLUCOSE, CAPILLARY
Glucose-Capillary: 133 mg/dL — ABNORMAL HIGH (ref 70–99)
Glucose-Capillary: 192 mg/dL — ABNORMAL HIGH (ref 70–99)
Glucose-Capillary: 177 mg/dL — ABNORMAL HIGH (ref 70–99)
Glucose-Capillary: 140 mg/dL — ABNORMAL HIGH (ref 70–99)

## 2020-10-03 LAB — CBC WITH DIFFERENTIAL/PLATELET
Abs Immature Granulocytes: 0.09 10*3/uL — ABNORMAL HIGH (ref 0.00–0.07)
Basophils Absolute: 0 10*3/uL (ref 0.0–0.1)
Basophils Relative: 0 %
Eosinophils Absolute: 0 10*3/uL (ref 0.0–0.5)
Eosinophils Relative: 0 %
HCT: 23.1 % — ABNORMAL LOW (ref 39.0–52.0)
Hemoglobin: 7.4 g/dL — ABNORMAL LOW (ref 13.0–17.0)
Immature Granulocytes: 1 %
Lymphocytes Relative: 7 %
Lymphs Abs: 0.5 10*3/uL — ABNORMAL LOW (ref 0.7–4.0)
MCH: 29.7 pg (ref 26.0–34.0)
MCHC: 32 g/dL (ref 30.0–36.0)
MCV: 92.8 fL (ref 80.0–100.0)
Monocytes Absolute: 0.5 10*3/uL (ref 0.1–1.0)
Monocytes Relative: 7 %
Neutro Abs: 6.5 10*3/uL (ref 1.7–7.7)
Neutrophils Relative %: 85 %
Platelets: 172 10*3/uL (ref 150–400)
RBC: 2.49 MIL/uL — ABNORMAL LOW (ref 4.22–5.81)
RDW: 13.7 % (ref 11.5–15.5)
WBC: 7.7 10*3/uL (ref 4.0–10.5)
nRBC: 0 % (ref 0.0–0.2)

## 2020-10-03 LAB — URINE CULTURE: Culture: >=100000 — AB

## 2020-10-03 LAB — MAGNESIUM: Magnesium: 2.4 mg/dL (ref 1.7–2.4)

## 2020-10-03 LAB — HEPARIN LEVEL (UNFRACTIONATED): Heparin Unfractionated: 0.19 IU/mL — ABNORMAL LOW (ref 0.30–0.70)

## 2020-10-03 LAB — HEMOGLOBIN: Hemoglobin: 7.9 g/dL — ABNORMAL LOW (ref 13.0–17.0)

## 2020-10-03 MED ORDER — DEXTROSE-NACL 5-0.45 % IV SOLN: INTRAVENOUS | Status: DC

## 2020-10-03 NOTE — Consult Note (Signed)
Ian Jimenez, Ian Jimenez 10/03/20  Subjective:   LOS: 2  Ian Jimenez 87 year old Caucasian male with a past medical history of hyperlipidemia, hypertension, stroke/TIA, dementia, CKD stage III, and depression.  Patient presents to the emergency room from his skilled nursing facility with altered mental status and hematuria.  Patient has intermittent moments of alertness and orientation.  History was gathered from his daughter who states he was more confused today.  In the ED, patient found to have large amount of blood in urine.  Unable to assess for UTI symptoms.  CT scan was ordered showed left-sided hydronephrosis without obstructing stone possible bladder mass.  Patient admitted for severe sepsis with septic shock due to acute pyelonephritis.  Patient also being followed by urology and IR placed a left nephrostomy tube.  We have been consulted to monitor kidney function within the current  setting of septic shock.  Baseline creatinine is 1.41 back in April 2020.  Current creatinine is 3.89.    Patient seen resting in bed this morning Appears comfortable without distress Unable to answer questions at this time    Objective:  Vital signs in last 24 hours:  Temp:  [97.6 F (36.4 C)-98.7 F (37.1 C)] 97.9 F (36.6 C) (03/02 1523) Pulse Rate:  [60-72] 65 (03/02 1523) Resp:  [15-20] 18 (03/02 1523) BP: (112-139)/(59-67) 130/67 (03/02 1523) SpO2:  [97 %-100 %] 97 % (03/02 1523) Weight:  [70 kg] 70 kg (03/02 0444)  Weight change: -11.6 kg Filed Weights   10/01/20 2054 10/02/20 0001 10/03/20 0444  Weight: 71.3 kg 74.8 kg 70 kg    Intake/Output:    Intake/Output Summary (Last 24 hours) at 10/03/2020 1539 Last data filed at 10/03/2020 1100 Gross per 24 hour  Intake 781.04 ml  Output 920 ml  Net -138.96 ml     Physical Exam: General: No acute distress  HEENT Moist oral muosa, anteric  Pulm/lungs Clear, normal breathing pattern  CVS/Heart  regular rate,  no rubs or gallops  Abdomen:   Soft, left upper quadrant tenderness  Extremities:  Minimal peripheral edema  Neurologic:  Alert, disoriented  Skin:  No rashes or masses          Basic Metabolic Panel:  Recent Labs  Lab 10/01/20 1124 10/02/20 1034 10/03/20 0626  NA 140 138 142  K 4.2 4.8 4.6  CL 104 104 107  CO2 23 21* 24  GLUCOSE 184* 172* 132*  BUN 48* 58* 69*  CREATININE 3.98* 4.03* 3.89*  CALCIUM 8.2* 7.6* 7.6*  MG  --   --  2.4     CBC: Recent Labs  Lab 10/01/20 1124 10/02/20 0725 10/02/20 1124 10/03/20 0626 10/03/20 1405  WBC 19.5* 10.9* 9.8 7.7  --   NEUTROABS 18.0*  --  8.5* 6.5  --   HGB 9.7* 8.0* 8.7* 7.4* 7.9*  HCT 29.0* 25.0* 26.7* 23.1*  --   MCV 91.2 91.6 92.4 92.8  --   PLT 237 196 176 172  --      No results found for: HEPBSAG, HEPBSAB, HEPBIGM    Microbiology:  Recent Results (from the past 240 hour(s))  Urine Culture     Status: Abnormal   Collection Time: 10/01/20 11:24 AM   Specimen: Urine, Random  Result Value Ref Range Status   Specimen Description   Final    URINE, RANDOM Performed at [facility], [address] [place], [place] [address]    Special Requests   Final  NONE Performed at [facility], [address] [place], [place] [address]    Culture >=100,000 COLONIES/mL KLEBSIELLA PNEUMONIAE (A)  Final   Report Status 10/03/2020 FINAL  Final   Organism ID, Bacteria KLEBSIELLA PNEUMONIAE (A)  Final      Susceptibility   Klebsiella pneumoniae - MIC*    AMPICILLIN RESISTANT Resistant     CEFAZOLIN <=4 SENSITIVE Sensitive     CEFEPIME <=0.12 SENSITIVE Sensitive     CEFTRIAXONE <=0.25 SENSITIVE Sensitive     CIPROFLOXACIN <=0.25 SENSITIVE Sensitive     GENTAMICIN <=1 SENSITIVE Sensitive     IMIPENEM <=0.25 SENSITIVE Sensitive     NITROFURANTOIN 32 SENSITIVE Sensitive     TRIMETH/SULFA <=20 SENSITIVE Sensitive     AMPICILLIN/SULBACTAM 4 SENSITIVE Sensitive     PIP/TAZO <=4 SENSITIVE Sensitive      * >=100,000 COLONIES/mL KLEBSIELLA PNEUMONIAE  Blood culture (routine x 2)     Status: Abnormal (Preliminary result)   Collection Time: 10/01/20 11:24 AM   Specimen: BLOOD  Result Value Ref Range Status   Specimen Description   Final    BLOOD RIGHT ANTECUBITAL Performed at [facility], [address] [place], [place] [address]    Special Requests   Final    BOTTLES DRAWN AEROBIC AND ANAEROBIC Blood Culture adequate volume Performed at [facility], [address] [place], [place] [address]    Culture  Setup Time   Final    GRAM NEGATIVE RODS IN BOTH AEROBIC AND ANAEROBIC BOTTLES CRITICAL RESULT CALLED TO, READ BACK BY AND VERIFIED WITH: JASON ROBBINS '@0255'$  10/02/20 RH Performed at [facility], [place] [address], [place], [place] [address]    Culture KLEBSIELLA PNEUMONIAE (A)  Final   Report Status PENDING  Incomplete  Blood Culture ID Panel (Reflexed)     Status: Abnormal   Collection Time: 10/01/20 11:24 AM  Result Value Ref Range Status   Enterococcus faecalis NOT DETECTED NOT DETECTED Final   Enterococcus Faecium NOT DETECTED NOT DETECTED Final   Listeria monocytogenes NOT DETECTED NOT DETECTED Final   Staphylococcus species NOT DETECTED NOT DETECTED Final   Staphylococcus aureus (BCID) NOT DETECTED NOT DETECTED Final   Staphylococcus epidermidis NOT DETECTED NOT DETECTED Final   Staphylococcus lugdunensis NOT DETECTED NOT DETECTED Final   Streptococcus species NOT DETECTED NOT DETECTED Final   Streptococcus agalactiae NOT DETECTED NOT DETECTED Final   Streptococcus pneumoniae NOT DETECTED NOT DETECTED Final   Streptococcus pyogenes NOT DETECTED NOT DETECTED Final   A.calcoaceticus-baumannii NOT DETECTED NOT DETECTED Final   Bacteroides fragilis NOT DETECTED NOT DETECTED Final   Enterobacterales DETECTED (A) NOT DETECTED Final    Comment: Enterobacterales represent a large order of gram negative bacteria, not a single organism. CRITICAL RESULT  CALLED TO, READ BACK BY AND VERIFIED WITH: JASON ROBBINS '@0255'$  10/02/20 RH    Enterobacter cloacae complex NOT DETECTED NOT DETECTED Final   Escherichia coli NOT DETECTED NOT DETECTED Final   Klebsiella aerogenes NOT DETECTED NOT DETECTED Final   Klebsiella oxytoca NOT DETECTED NOT DETECTED Final   Klebsiella pneumoniae DETECTED (A) NOT DETECTED Final    Comment: CRITICAL RESULT CALLED TO, READ BACK BY AND VERIFIED WITH: JASON ROBBINS '@0255'$  10/02/20 RH    Proteus species NOT DETECTED NOT DETECTED Final   Salmonella species NOT DETECTED NOT DETECTED Final   Serratia marcescens NOT DETECTED NOT DETECTED Final   Haemophilus influenzae NOT DETECTED NOT DETECTED Final   Neisseria meningitidis NOT DETECTED NOT DETECTED Final  Pseudomonas aeruginosa NOT DETECTED NOT DETECTED Final   Stenotrophomonas maltophilia NOT DETECTED NOT DETECTED Final   Candida albicans NOT DETECTED NOT DETECTED Final   Candida auris NOT DETECTED NOT DETECTED Final   Candida glabrata NOT DETECTED NOT DETECTED Final   Candida krusei NOT DETECTED NOT DETECTED Final   Candida parapsilosis NOT DETECTED NOT DETECTED Final   Candida tropicalis NOT DETECTED NOT DETECTED Final   Cryptococcus neoformans/gattii NOT DETECTED NOT DETECTED Final   CTX-M ESBL NOT DETECTED NOT DETECTED Final   Carbapenem resistance IMP NOT DETECTED NOT DETECTED Final   Carbapenem resistance KPC NOT DETECTED NOT DETECTED Final   Carbapenem resistance NDM NOT DETECTED NOT DETECTED Final   Carbapenem resist OXA 48 LIKE NOT DETECTED NOT DETECTED Final   Carbapenem resistance VIM NOT DETECTED NOT DETECTED Final    Comment: Performed at [facility], [place], [place] [address]  SARS CORONAVIRUS 2 (TAT 6-24 HRS) Nasopharyngeal Nasopharyngeal Swab     Status: None   Collection Time: 10/01/20 12:36 PM   Specimen: Nasopharyngeal Swab  Result Value Ref Range Status   SARS Coronavirus 2 NEGATIVE NEGATIVE Final    Comment:  (NOTE) SARS-CoV-2 target nucleic acids are NOT DETECTED.  The SARS-CoV-2 RNA is generally detectable in upper and lower respiratory specimens during the acute phase of infection. Negative results do not preclude SARS-CoV-2 infection, do not rule out co-infections with other pathogens, and should not be used as the sole basis for treatment or other patient management decisions. Negative results must be combined with clinical observations, patient history, and epidemiological information. The expected result is Negative.  Fact Sheet for Patients: SugarRoll.be  Fact Sheet for Healthcare Providers: https://www.woods-mathews.com/  This test is not yet approved or cleared by the [place] FDA and  has been authorized for detection and/or diagnosis of SARS-CoV-2 by FDA under an Emergency Use Authorization (EUA). This EUA will remain  in effect (meaning this test can be used) for the duration of the COVID-19 declaration under Se ction 564(b)(1) of the Act, 21 U.S.C. section 360bbb-3(b)(1), unless the authorization is terminated or revoked sooner.  Performed at [facility], [place] [address], [place], [place] [address]   Blood culture (routine x 2)     Status: None (Preliminary result)   Collection Time: 10/01/20 12:46 PM   Specimen: BLOOD  Result Value Ref Range Status   Specimen Description BLOOD BOTTOM LEFT HAND  Final   Special Requests   Final    BOTTLES DRAWN AEROBIC AND ANAEROBIC Blood Culture adequate volume   Culture   Final    NO GROWTH 2 DAYS Performed at [facility], [address] [place], [place] [address]    Report Status PENDING  Incomplete  Urine culture     Status: Abnormal (Preliminary result)   Collection Time: 10/01/20  4:08 PM   Specimen: Urine, Catheterized  Result Value Ref Range Status   Specimen Description URINE, CATHETERIZED  Final   Special Requests SYRINGE  Final   Culture (A)  Final     >=100,000 COLONIES/mL KLEBSIELLA PNEUMONIAE SUSCEPTIBILITIES TO FOLLOW Performed at [facility], [place] [address] [place], [place] [address]    Report Status PENDING  Incomplete    Coagulation Studies: Recent Labs    10/01/20 1124  LABPROT 15.2  INR 1.2    Urinalysis: Recent Labs    10/01/20 1124  COLORURINE RED*  LABSPEC 1.026  PHURINE TEST NOT REPORTED DUE TO COLOR INTERFERENCE OF  URINE PIGMENT  GLUCOSEU TEST NOT REPORTED DUE TO COLOR INTERFERENCE OF URINE PIGMENT*  HGBUR TEST NOT REPORTED DUE TO COLOR INTERFERENCE OF URINE PIGMENT*  BILIRUBINUR TEST NOT REPORTED DUE TO COLOR INTERFERENCE OF URINE PIGMENT*  KETONESUR TEST NOT REPORTED DUE TO COLOR INTERFERENCE OF URINE PIGMENT*  PROTEINUR TEST NOT REPORTED DUE TO COLOR INTERFERENCE OF URINE PIGMENT*  NITRITE TEST NOT REPORTED DUE TO COLOR INTERFERENCE OF URINE PIGMENT*  LEUKOCYTESUR TEST NOT REPORTED DUE TO COLOR INTERFERENCE OF URINE PIGMENT*      Imaging: [place] Intraoperative  Result Date: 10/01/2020 CLINICAL DATA:  Ultrasound was provided for use by the ordering physician.  No provider Interpretation or professional fees incurred.    ECHOCARDIOGRAM COMPLETE  Result Date: 10/02/2020    ECHOCARDIOGRAM REPORT   Patient Name:   Ian Jimenez Date of Exam: 10/01/2020 Medical Rec #:  [id number]   Height:       69.0 in Accession #:    [id number]  Weight:       180.0 lb Date of Birth:  1933/12/21   BSA:          1.976 m Patient Age:    51 years    BP:           127/61 mmHg Patient Gender: M           HR:           80 bpm. Exam Location:  [facility] Procedure: 2D Echo, Cardiac Doppler and Color Doppler Indications:     NSTEMI I21.4  History:         Patient has prior history of Echocardiogram examinations.                  Stroke; Risk Factors:Hypertension.  Sonographer:     Alyse Low Roar Referring Phys:  Unknown Foley NIU Diagnosing Phys: Nelva Bush MD IMPRESSIONS  1. Left ventricular ejection fraction, by estimation, is 25 to 30%. The  left ventricle has severely decreased function. The left ventricle demonstrates global hypokinesis. There is mild left ventricular hypertrophy. Left ventricular diastolic parameters  are indeterminate.  2. Right ventricular systolic function is low normal. The right ventricular size is normal. There is moderately elevated pulmonary artery systolic pressure.  3. Suspect ASD present with left-to-right shunting, incompletely imaged. Evidence of atrial level shunting detected by color flow Doppler.  4. The mitral valve is abnormal. Mild mitral valve regurgitation. No evidence of mitral stenosis.  5. The aortic valve is tricuspid. There is mild calcification of the aortic valve. There is mild thickening of the aortic valve. Aortic valve regurgitation is not visualized. Mild to moderate aortic valve sclerosis/calcification is present, without any evidence of aortic stenosis.  6. Mildly dilated pulmonary artery.  7. The inferior vena cava is dilated in size with <50% respiratory variability, suggesting right atrial pressure of 15 mmHg. FINDINGS  Left Ventricle: Left ventricular ejection fraction, by estimation, is 25 to 30%. The left ventricle has severely decreased function. The left ventricle demonstrates global hypokinesis. The left ventricular internal cavity size was normal in size. There is mild left ventricular hypertrophy. Left ventricular diastolic parameters are indeterminate. Right Ventricle: The right ventricular size is normal. No increase in right ventricular wall thickness. Right ventricular systolic function is low normal. There is moderately elevated pulmonary artery systolic pressure. The tricuspid regurgitant velocity  is 3.08 m/s, and with an assumed right atrial pressure of 15 mmHg, the estimated right ventricular systolic pressure is 99991111 mmHg. Left Atrium: Left atrial size was  normal in size. Right Atrium: Right atrial size was normal in size. Pericardium: Trivial pericardial effusion is present.  Presence of pericardial fat pad. Mitral Valve: The mitral valve is abnormal. There is mild thickening of the mitral valve leaflet(s). There is mild calcification of the mitral valve leaflet(s). Mild mitral annular calcification. Mild mitral valve regurgitation. No evidence of mitral valve stenosis. Tricuspid Valve: The tricuspid valve is normal in structure. Tricuspid valve regurgitation is mild. Aortic Valve: The aortic valve is tricuspid. There is mild calcification of the aortic valve. There is mild thickening of the aortic valve. Aortic valve regurgitation is not visualized. Mild to moderate aortic valve sclerosis/calcification is present, without any evidence of aortic stenosis. Aortic valve peak gradient measures 6.2 mmHg. Pulmonic Valve: The pulmonic valve was normal in structure. Pulmonic valve regurgitation is not visualized. No evidence of pulmonic stenosis. Aorta: The aortic root is normal in size and structure. Pulmonary Artery: The pulmonary artery is mildly dilated. Venous: The inferior vena cava is dilated in size with less than 50% respiratory variability, suggesting right atrial pressure of 15 mmHg. IAS/Shunts: Evidence of atrial level shunting detected by color flow Doppler.  LEFT VENTRICLE PLAX 2D LVIDd:         4.90 cm      Diastology LVIDs:         4.20 cm      LV e' medial:    12.30 cm/s LV PW:         1.20 cm      LV E/e' medial:  9.0 LV IVS:        1.30 cm      LV e' lateral:   11.10 cm/s LVOT diam:     2.10 cm      LV E/e' lateral: 10.0 LVOT Area:     3.46 cm  LV Volumes (MOD) LV vol d, MOD A2C: 107.0 ml LV vol d, MOD A4C: 106.0 ml LV vol s, MOD A2C: 73.0 ml LV vol s, MOD A4C: 74.3 ml LV SV MOD A2C:     34.0 ml LV SV MOD A4C:     106.0 ml LV SV MOD BP:      31.9 ml RIGHT VENTRICLE RV Mid diam:    2.80 cm RV S prime:     10.90 cm/s TAPSE (M-mode): 1.9 cm LEFT ATRIUM             Index       RIGHT ATRIUM           Index LA diam:        4.00 cm 2.02 cm/m  RA Area:     16.90 cm LA Vol (A2C):    64.6 ml 32.70 ml/m RA Volume:   46.10 ml  23.34 ml/m LA Vol (A4C):   41.0 ml 20.75 ml/m LA Biplane Vol: 51.9 ml 26.27 ml/m  AORTIC VALVE                PULMONIC VALVE AV Area (Vmax): 1.95 cm    PV Vmax:       1.13 m/s AV Vmax:        124.00 cm/s PV Peak grad:  5.1 mmHg AV Peak Grad:   6.2 mmHg LVOT Vmax:      69.80 cm/s  AORTA Ao Root diam: 2.80 cm MITRAL VALVE                TRICUSPID VALVE MV Area (PHT): 6.37 cm     TR Peak grad:  37.9 mmHg MV Decel Time: 119 msec     TR Vmax:        308.00 cm/s MV E velocity: 111.00 cm/s                             SHUNTS                             Systemic Diam: 2.10 cm Nelva Bush MD Electronically signed by Nelva Bush MD Signature Date/Time: 10/02/2020/7:17:48 AM    Final    IR NEPHROSTOMY PLACEMENT LEFT  Result Date: 10/01/2020 INDICATION: 87 year old male presenting with presumed urinary sepsis and non ST elevated myocardial infarction, bladder mass suspected. EXAM: 1. ULTRASOUND GUIDANCE FOR PUNCTURE OF THE LEFT RENAL COLLECTING SYSTEM 2. LEFT PERCUTANEOUS NEPHROSTOMY TUBE PLACEMENT. COMPARISON:  None. MEDICATIONS: The patient is receiving appropriate inpatient antibiotic coverage prior to procedure. ANESTHESIA/SEDATION: Moderate (conscious) sedation was employed during this procedure. A total of Versed 0 mg and Fentanyl 25 mcg was administered intravenously. Moderate Sedation Time: 0 minutes. The patient's level of consciousness and vital signs were monitored continuously by radiology nursing throughout the procedure under my direct supervision. CONTRAST:  Twenty mL Isovue 300 - administered into the renal collecting system FLUOROSCOPY TIME:  0.8 minutes, 123456 mGy COMPLICATIONS: None immediate. PROCEDURE: The procedure, risks, benefits, and alternatives were explained to the patient. Questions regarding the procedure were encouraged and answered. The patient understands and consents to the procedure. A timeout was performed prior to the initiation of the  procedure. The left flank region was prepped and draped in the usual sterile fashion and a sterile drape was applied covering the operative field. A sterile gown and sterile gloves were used for the procedure. Local anesthesia was provided with 1% Lidocaine. Ultrasound was used to localize the left kidney. Under direct ultrasound guidance, a 20 gauge needle was advanced into the renal collecting system. An ultrasound image documentation was performed. Access within the collecting system was confirmed with the efflux of urine followed by limited contrast injection. Over a Nitrex wire, the tract was dilated with an Accustick stent. Next, under intermittent fluoroscpic guidance and over a short Amplatz wire, the track was dilated ultimately allowing placement of a 10.2 percutaneous nephrostomy catheter which was advanced to the level of the renal pelvis where the coil was formed and locked. Contrast was injected and several spot fluoroscopic images were obtained in various obliquities. The catheter was secured at the skin with a 0 silk retention suture and stat lock device and connected to a gravity bag was placed. Dressings were applied. The patient tolerated procedure well without immediate postprocedural complication. FINDINGS: Ultrasound scanning demonstrates a moderately dilated left collecting system. Under a combination of ultrasound and fluoroscopic guidance, a posterior inferior calix was targeted allowing placement of a 10.2 percutaneous nephrostomy catheter with end coiled and locked within the renal pelvis. Contrast injection confirmed appropriate positioning. Contrast does not travel beyond the mid ureter. IMPRESSION: Successful ultrasound and fluoroscopic guided placement of a left sided 10.2 [place] PCN. PLAN: Interventional Radiology will arrange for routine check and exchange of indwelling nephrostomy in 4-6 weeks. Ruthann Cancer, MD [facility]  Electronically Signed   By: Ruthann Cancer MD   On: 10/01/2020 16:21     Medications:   . cefTRIAXone (ROCEPHIN)  IV Stopped (10/03/20 0541)  . dextrose 5 % and 0.45% NaCl 75  mL/hr at 10/03/20 0917   . atorvastatin  40 mg Oral Daily  . carbidopa-levodopa  1 tablet Oral TID  . carvedilol  3.125 mg Oral BID WC  . divalproex  250 mg Oral BID  . donepezil  10 mg Oral QHS  . escitalopram  5 mg Oral Daily  . insulin aspart  0-5 Units Subcutaneous QHS  . insulin aspart  0-9 Units Subcutaneous TID WC  . iodixanol  50 mL Other Once  . sodium chloride flush  5 mL Intracatheter Q8H   albuterol, dextromethorphan-guaiFENesin, loperamide, ondansetron (ZOFRAN) IV, oxyCODONE-acetaminophen  Assessment/ Plan:  87 y.o. male with  was admitted on 10/01/2020 for  Principal Problem:   Acute pyelonephritis Active Problems:   TIA (transient ischemic attack)   Hypertension associated with diabetes ([place])   Depression due to dementia [facility])   NSTEMI (non-ST elevated myocardial infarction) ([place])   Type II diabetes mellitus with renal manifestations ([place])   Acute renal failure superimposed on stage 3a chronic kidney disease ([place])   HLD (hyperlipidemia)   Dementia ([place])   Hydronephrosis of left kidney   Acute respiratory failure with hypoxia ([facility])   Severe sepsis with septic shock ([place])   Septicemia due to Klebsiella pneumoniae (H[facility])   HFrEF (heart failure with reduced ejection fraction) ([place])  NSTEMI (non-ST elevated myocardial infarction) ([place]) [I21.4] Bladder mass [N32.89] Sepsis with acute renal failure and tubular necrosis without septic shock, due to unspecified organism ([place]) [A41.9, R65.20, N17.0]  #. Acute Kidney Injury on Chronic kidney disease Stage 4 -likely due to acute pyelonephritis with Klebsiella pneumoniae -Baseline creatinine is 1.41 back in April 2020.  -Current creatinine is 3.89.  -Nephrostomy tube placed via vascular -Will monitor labs for renal recovery -Continue IVF  #.  Acute Pyelonephritis secondary to obstruction  -Left nephrostomy tube placed to assist drainage -Antibiotics -IVF D51/2 NS '@75'$  ml/hr   #. Diabetes type 2 with CKD Hgb A1c MFr Bld (%)  Date Value  10/02/2020 6.6 (H)  Glucose stable at this time  Thank you for the consult   LOS: [place] 3/2/20223:39 PM  [place], [place]

## 2020-10-03 NOTE — Progress Notes (Signed)
PROGRESS NOTE    Ian Jimenez  [id number] DOB: 1933/08/22 DOA: 10/01/2020 PCP: Albina Billet, MD   Chief complaint.  Altered mental status. Brief Narrative:  Ian Jimenez is a 87 y.o. male with medical history significant of HTN, HLD, stroke/TIA, dementia, CKD-III, depression, presents with AMS, hematuria. CT abdomen/pelvis showed a possible blood clots in the bladder, with the possibility of bladder mass.  Patient also has hydronephrosis.  Emergent nephrostomy was performed by IR.  Urine cultures so far growing gram-negative rods, blood culture positive for Klebsiella pneumonia.  Patient has been treated with Rocephin. Patient also had elevated troponin, peaked at 2102.  Has been seen by cardiology, not a good candidate for heart cath due to multiple medical problems including renal failure, and bacteremia.   Assessment & Plan:   Principal Problem:   Acute pyelonephritis Active Problems:   TIA (transient ischemic attack)   Hypertension associated with diabetes ([place])   Depression due to dementia [facility])   NSTEMI (non-ST elevated myocardial infarction) ([place])   Type II diabetes mellitus with renal manifestations ([place])   Acute renal failure superimposed on stage 3a chronic kidney disease ([place])   HLD (hyperlipidemia)   Dementia (HCC)   Hydronephrosis of left kidney   Acute respiratory failure with hypoxia (H[facility])   Severe sepsis with septic shock ([place])   Septicemia due to Klebsiella pneumoniae ([place])  # Severe sepsis with septic shock secondary to pyelonephritis. # Klebsiella pneumoniae bactermia # Acute pyelonephritis secondary to obstruction secondary to Klebsiella pneumonia. # Left hydronephrosis likely secondary to bladder mass. Patient is status post left nephrostomy.  Currently treated with Rocephin. Patient had received IV fluid bolus, due to acute renal failure, will continue fluids for now. Urology consult obtained for possible bladder mass. Sensitivities have returned -  continue ceftriaxone for now - f/u urology recs - possible palliative care consult - ID consult if does not transition to comfort care   # Non-STEMI. Acute systolic congestive heart failure with ejection fraction 25 to 30%. Moderate pulmonary hypertension. Acute hypoxemic respiratory failure. Patient has significant elevation of troponin, followed by cardiology. He is not a good candidate for heart cath due to medical conditions. Patient had echocardiogram from 2019, ejection fraction was normal. I am not certain about the patient volume status, she has elevated BNP, but this is associated with worsening renal function.  Chest x-ray does not seem to have increased pulmonary edema.  He is currently on IV fluids for acute kidney injury. H has dropped to 7.4 today from 8-9s. Nephrostomy tube w/ bloody fluid, urine is clear from foley - repeat hgb later today, hold heparin if continues to drop - f/u cardiology recs   #.  Acute kidney injury on chronic kidney disease stage IIIa versus stage IV. Due to patient previous record, new early 2020, creatinine was 1.5, later the year creatinine increased to 3.5, this might reflect an obstruction from bladder mass. Patient is status post left nephrostomy, will monitor renal function until patient renal function reaches baseline. No hyperkalemia or acidosis - nephrology consult pending  #   Type 2 diabetes. Well controlled.  # .  Essential hypertension. Continue to monitor blood pressure. Currently wnl  #  Parkinson disease with dementia. Acute metabolic encephalopathy Patient still very confused not able to communicate.  CT head showed atrophy with old infarct, no acute changes. Continue home medicines.      DVT prophylaxis: IV heparin Code Status: Partial (pressors but no cpr or intubation) Family Communication: daughter updated @  bedside Disposition Plan:  .   Status is: Inpatient  Remains inpatient appropriate because:Inpatient  level of care appropriate due to severity of illness   Dispo: The patient is from: Home              Anticipated d/c is to: Home              Patient currently is not medically stable to d/c.   Difficult to place patient No        I/O last 3 completed shifts: In: [id number] [P.O.:330; I.V.:270.4; IV Piggyback:220.6] Out: 1250 [Urine:550; Drains:700] No intake/output data recorded.     Consultants:   Cardiology, nephrology, urologist  Procedures: Left nephrostomy.  Antimicrobials:  Rocephin  Subjective: Pt not communicative, does not appear in pain.  Objective: Vitals:   10/02/20 1632 10/02/20 2009 10/03/20 0444 10/03/20 0720  BP: 139/63 112/60 136/67 137/64  Pulse: 72 68 64 62  Resp: '15 19 18 18  '$ Temp: 98.1 F (36.7 C) 98.7 F (37.1 C) 98.2 F (36.8 C) 97.6 F (36.4 C)  TempSrc:  Oral Oral Oral  SpO2: 98% 98% 100% 100%  Weight:   70 kg   Height:        Intake/Output Summary (Last 24 hours) at 10/03/2020 0834 Last data filed at 10/03/2020 0602 Gross per 24 hour  Intake 781.04 ml  Output 1050 ml  Net -268.96 ml   Filed Weights   10/01/20 2054 10/02/20 0001 10/03/20 0444  Weight: 71.3 kg 74.8 kg 70 kg    Examination:  General exam: Appears calm and comfortable, ill appearing  Respiratory system: Clear to auscultation. Respiratory effort normal. Cardiovascular system: S1 & S2 heard, RRR. No JVD, murmurs, rubs, gallops or clicks. No pedal edema. Gastrointestinal system: Abdomen is mildly distended, soft and nontender. No organomegaly or masses felt. Normal bowel sounds heard. Central nervous system: Drowsy, minimally responsive Extremities: Symmetric  Skin: No rashes, lesions or ulcers. Pale      Data Reviewed: I have personally reviewed following labs and imaging studies  CBC: Recent Labs  Lab 10/01/20 1124 10/02/20 0725 10/02/20 1124 10/03/20 0626  WBC 19.5* 10.9* 9.8 7.7  NEUTROABS 18.0*  --  8.5* 6.5  HGB 9.7* 8.0* 8.7* 7.4*  HCT 29.0* 25.0*  26.7* 23.1*  MCV 91.2 91.6 92.4 92.8  PLT 237 196 176 [id number]   Basic Metabolic Panel: Recent Labs  Lab 10/01/20 1124 10/02/20 1034 10/03/20 0626  NA 140 138 142  K 4.2 4.8 4.6  CL 104 104 107  CO2 23 21* 24  GLUCOSE 184* 172* 132*  BUN 48* 58* 69*  CREATININE 3.98* 4.03* 3.89*  CALCIUM 8.2* 7.6* 7.6*  MG  --   --  2.4   GFR: Estimated Creatinine Clearance: 14 mL/min (A) (by C-G formula based on SCr of 3.89 mg/dL (H)). Liver Function Tests: Recent Labs  Lab 10/01/20 1124  AST 19  ALT 7  ALKPHOS 54  BILITOT 0.7  PROT 6.5  ALBUMIN 2.7*   No results for input(s): LIPASE, AMYLASE in the last 168 hours. No results for input(s): AMMONIA in the last 168 hours. Coagulation Profile: Recent Labs  Lab 10/01/20 1124  INR 1.2   Cardiac Enzymes: No results for input(s): CKTOTAL, CKMB, CKMBINDEX, TROPONINI in the last 168 hours. BNP (last 3 results) No results for input(s): PROBNP in the last 8760 hours. HbA1C: Recent Labs    10/02/20 0725  HGBA1C 6.6*   CBG: Recent Labs  Lab 10/02/20 0756 10/02/20 1133 10/02/20  1631 10/02/20 2008 10/03/20 0717  GLUCAP 169* 148* 141* 150* 133*   Lipid Profile: Recent Labs    10/02/20 0725  CHOL 112  HDL 36*  LDLCALC 58  TRIG 89  CHOLHDL 3.1   Thyroid Function Tests: No results for input(s): TSH, T4TOTAL, FREET4, T3FREE, THYROIDAB in the last 72 hours. Anemia Panel: Recent Labs    10/01/20 1525 10/02/20 0725  VITAMINB12 290  --   TIBC  --  168*  IRON  --  6*   Sepsis Labs: Recent Labs  Lab 10/01/20 1105 10/01/20 1525 10/01/20 2333 10/02/20 0145  PROCALCITON  --  71.94  --   --   LATICACIDVEN 5.1* 3.7* 2.9* 1.9    Recent Results (from the past 240 hour(s))  Urine Culture     Status: Abnormal   Collection Time: 10/01/20 11:24 AM   Specimen: Urine, Random  Result Value Ref Range Status   Specimen Description   Final    URINE, RANDOM Performed at [facility], [address] [place], [place]  [address]    Special Requests   Final    NONE Performed at [facility], [address] [place], [place] [address]    Culture >=100,000 COLONIES/mL KLEBSIELLA PNEUMONIAE (A)  Final   Report Status 10/03/2020 FINAL  Final   Organism ID, Bacteria KLEBSIELLA PNEUMONIAE (A)  Final      Susceptibility   Klebsiella pneumoniae - MIC*    AMPICILLIN RESISTANT Resistant     CEFAZOLIN <=4 SENSITIVE Sensitive     CEFEPIME <=0.12 SENSITIVE Sensitive     CEFTRIAXONE <=0.25 SENSITIVE Sensitive     CIPROFLOXACIN <=0.25 SENSITIVE Sensitive     GENTAMICIN <=1 SENSITIVE Sensitive     IMIPENEM <=0.25 SENSITIVE Sensitive     NITROFURANTOIN 32 SENSITIVE Sensitive     TRIMETH/SULFA <=20 SENSITIVE Sensitive     AMPICILLIN/SULBACTAM 4 SENSITIVE Sensitive     PIP/TAZO <=4 SENSITIVE Sensitive     * >=100,000 COLONIES/mL KLEBSIELLA PNEUMONIAE  Blood culture (routine x 2)     Status: Abnormal (Preliminary result)   Collection Time: 10/01/20 11:24 AM   Specimen: BLOOD  Result Value Ref Range Status   Specimen Description   Final    BLOOD RIGHT ANTECUBITAL Performed at [facility], [address], [place], [place] [address]    Special Requests   Final    BOTTLES DRAWN AEROBIC AND ANAEROBIC Blood Culture adequate volume Performed at [facility], [address] [place], [place] [address]    Culture  Setup Time   Final    GRAM NEGATIVE RODS IN BOTH AEROBIC AND ANAEROBIC BOTTLES CRITICAL RESULT CALLED TO, READ BACK BY AND VERIFIED WITH: JASON ROBBINS '@0255'$  10/02/20 RH Performed at [facility], [place] [address] [place],  [address]    Culture KLEBSIELLA PNEUMONIAE (A)  Final   Report Status PENDING  Incomplete  Blood Culture ID Panel (Reflexed)     Status: Abnormal   Collection Time: 10/01/20 11:24 AM  Result Value Ref Range Status   Enterococcus faecalis NOT DETECTED NOT DETECTED Final   Enterococcus Faecium NOT DETECTED NOT DETECTED Final   Listeria  monocytogenes NOT DETECTED NOT DETECTED Final   Staphylococcus species NOT DETECTED NOT DETECTED Final   Staphylococcus aureus (BCID) NOT DETECTED NOT DETECTED Final   Staphylococcus epidermidis NOT DETECTED NOT DETECTED Final   Staphylococcus lugdunensis NOT DETECTED NOT DETECTED Final   Streptococcus species NOT DETECTED NOT DETECTED Final   Streptococcus agalactiae NOT  DETECTED NOT DETECTED Final   Streptococcus pneumoniae NOT DETECTED NOT DETECTED Final   Streptococcus pyogenes NOT DETECTED NOT DETECTED Final   A.calcoaceticus-baumannii NOT DETECTED NOT DETECTED Final   Bacteroides fragilis NOT DETECTED NOT DETECTED Final   Enterobacterales DETECTED (A) NOT DETECTED Final    Comment: Enterobacterales represent a large order of gram negative bacteria, not a single organism. CRITICAL RESULT CALLED TO, READ BACK BY AND VERIFIED WITH: JASON ROBBINS '@0255'$  10/02/20 RH    Enterobacter cloacae complex NOT DETECTED NOT DETECTED Final   Escherichia coli NOT DETECTED NOT DETECTED Final   Klebsiella aerogenes NOT DETECTED NOT DETECTED Final   Klebsiella oxytoca NOT DETECTED NOT DETECTED Final   Klebsiella pneumoniae DETECTED (A) NOT DETECTED Final    Comment: CRITICAL RESULT CALLED TO, READ BACK BY AND VERIFIED WITH: JASON ROBBINS '@0255'$  10/02/20 RH    Proteus species NOT DETECTED NOT DETECTED Final   Salmonella species NOT DETECTED NOT DETECTED Final   Serratia marcescens NOT DETECTED NOT DETECTED Final   Haemophilus influenzae NOT DETECTED NOT DETECTED Final   Neisseria meningitidis NOT DETECTED NOT DETECTED Final   Pseudomonas aeruginosa NOT DETECTED NOT DETECTED Final   Stenotrophomonas maltophilia NOT DETECTED NOT DETECTED Final   Candida albicans NOT DETECTED NOT DETECTED Final   Candida auris NOT DETECTED NOT DETECTED Final   Candida glabrata NOT DETECTED NOT DETECTED Final   Candida krusei NOT DETECTED NOT DETECTED Final   Candida parapsilosis NOT DETECTED NOT DETECTED Final   Candida  tropicalis NOT DETECTED NOT DETECTED Final   Cryptococcus neoformans/gattii NOT DETECTED NOT DETECTED Final   CTX-M ESBL NOT DETECTED NOT DETECTED Final   Carbapenem resistance IMP NOT DETECTED NOT DETECTED Final   Carbapenem resistance KPC NOT DETECTED NOT DETECTED Final   Carbapenem resistance NDM NOT DETECTED NOT DETECTED Final   Carbapenem resist OXA 48 LIKE NOT DETECTED NOT DETECTED Final   Carbapenem resistance VIM NOT DETECTED NOT DETECTED Final    Comment: Performed at [facility], [address] [place], [place] [address]  SARS CORONAVIRUS 2 (TAT 6-24 HRS) Nasopharyngeal Nasopharyngeal Swab     Status: None   Collection Time: 10/01/20 12:36 PM   Specimen: Nasopharyngeal Swab  Result Value Ref Range Status   SARS Coronavirus 2 NEGATIVE NEGATIVE Final    Comment: (NOTE) SARS-CoV-2 target nucleic acids are NOT DETECTED.  The SARS-CoV-2 RNA is generally detectable in upper and lower respiratory specimens during the acute phase of infection. Negative results do not preclude SARS-CoV-2 infection, do not rule out co-infections with other pathogens, and should not be used as the sole basis for treatment or other patient management decisions. Negative results must be combined with clinical observations, patient history, and epidemiological information. The expected result is Negative.  Fact Sheet for Patients: SugarRoll.be  Fact Sheet for Healthcare Providers: https://www.woods-mathews.com/  This test is not yet approved or cleared by the [place] FDA and  has been authorized for detection and/or diagnosis of SARS-CoV-2 by FDA under an Emergency Use Authorization (EUA). This EUA will remain  in effect (meaning this test can be used) for the duration of the COVID-19 declaration under Se ction 564(b)(1) of the Act, 21 U.S.C. section 360bbb-3(b)(1), unless the authorization is terminated or revoked sooner.  Performed at  [facility], [place] [address], [place], [place] [address]   Blood culture (routine x 2)     Status: None (Preliminary result)   Collection Time: 10/01/20 12:46 PM   Specimen: BLOOD  Result  Value Ref Range Status   Specimen Description BLOOD BOTTOM LEFT HAND  Final   Special Requests   Final    BOTTLES DRAWN AEROBIC AND ANAEROBIC Blood Culture adequate volume   Culture   Final    NO GROWTH 2 DAYS Performed at [facility], [address], [place], [place] [address]    Report Status PENDING  Incomplete  Urine culture     Status: Abnormal (Preliminary result)   Collection Time: 10/01/20  4:08 PM   Specimen: Urine, Catheterized  Result Value Ref Range Status   Specimen Description URINE, CATHETERIZED  Final   Special Requests SYRINGE  Final   Culture (A)  Final    >=100,000 COLONIES/mL GRAM NEGATIVE RODS SUSCEPTIBILITIES TO FOLLOW Performed at [facility], [place] [address] [place],  [address]    Report Status PENDING  Incomplete         Radiology Studies: CT ABDOMEN PELVIS WO CONTRAST  Result Date: 10/01/2020 CLINICAL DATA:  Abdominal pain and hematuria EXAM: CT ABDOMEN AND PELVIS WITHOUT CONTRAST TECHNIQUE: Multidetector CT imaging of the abdomen and pelvis was performed following the standard protocol without IV contrast. COMPARISON:  Ultrasound from 07/12/2018 FINDINGS: Lower chest: Small left pleural effusion is noted with mild bibasilar atelectasis. Hepatobiliary: No focal liver abnormality is seen. No gallstones, gallbladder wall thickening, or biliary dilatation. Pancreas: Hypodensity in the region of the head of the pancreas which corresponds to a cystic lesion seen on the prior ultrasound examination. It measures approximately 15 mm. The remainder of the pancreas is within normal limits. Spleen: Normal in size without focal abnormality. Adrenals/Urinary Tract: Left adrenal gland is within normal limits. Right adrenal gland demonstrates a small 15  mm lesion best seen on image number 29 of series 2. Statistically this likely represents an adenoma. Right kidney is well visualized without obstructive change or renal calculi. Left kidney demonstrates evidence of small nonobstructing lower pole renal stone as well as hydronephrosis and hydroureter which extends to the level of the urinary bladder. No obstructing stone is noted. The bladder is decompressed and irregular hyperdense material is noted within the bladder likely representing thrombus given the clinical history. Underlying mass lesion at the bladder trigone may be present given the left-sided hydronephrosis. Stomach/Bowel: No obstructive or inflammatory changes of the colon are seen. The appendix is within normal limits. No small bowel or gastric abnormality is seen. Vascular/Lymphatic: Aortic atherosclerosis. No enlarged abdominal or pelvic lymph nodes. Reproductive: Prostate is unremarkable. Other: No abdominal wall hernia or abnormality. No abdominopelvic ascites. Musculoskeletal: Degenerative changes of lumbar spine are noted. IMPRESSION: Left-sided hydronephrosis is noted without obstructing renal stone. This raises suspicion for possible bladder mass. Additionally hyperdense material is noted within the bladder consistent with thrombus. Direct visualization is recommended for further evaluation. Nonobstructing lower pole left renal stone. Stable hypodensity within the head of the pancreas unchanged from 2019. Small right renal lesion likely representing an adenoma but incompletely evaluated on this exam. No other focal abnormality is noted. Electronically Signed   By: Inez Catalina M.D.   On: 10/01/2020 13:25   CT Head Wo Contrast  Result Date: 10/01/2020 CLINICAL DATA:  Mental status changes. EXAM: CT HEAD WITHOUT CONTRAST TECHNIQUE: Contiguous axial images were obtained from the base of the skull through the vertex without intravenous contrast. COMPARISON:  06/14/2020 FINDINGS: Brain: There is  no evidence for acute hemorrhage, hydrocephalus, mass lesion, or abnormal extra-axial fluid collection. No definite CT evidence for acute infarction. Diffuse loss of parenchymal  volume is consistent with atrophy. Patchy low attenuation in the deep hemispheric and periventricular white matter is nonspecific, but likely reflects chronic microvascular ischemic demyelination. Nonacute left parietooccipital infarct is new since prior. Vascular: No hyperdense vessel or unexpected calcification. Skull: No evidence for fracture. No worrisome lytic or sclerotic lesion. Sinuses/Orbits: The visualized paranasal sinuses and mastoid air cells are clear. Visualized portions of the globes and intraorbital fat are unremarkable. Other: None. IMPRESSION: 1. No acute intracranial abnormality. 2. Nonacute left parietooccipital infarct, new since prior. 3. Atrophy with chronic small vessel white matter ischemic disease. Electronically Signed   By: Misty Stanley M.D.   On: 10/01/2020 13:25   [place] Intraoperative  Result Date: 10/01/2020 CLINICAL DATA:  Ultrasound was provided for use by the ordering physician.  No provider Interpretation or professional fees incurred.    DG Chest Portable 1 View  Result Date: 10/01/2020 CLINICAL DATA:  87 year old male with altered mental status onset this morning. EXAM: PORTABLE CHEST 1 VIEW COMPARISON:  Portable chest 11/25/2018 and earlier. FINDINGS: Portable AP semi upright view at 1137 hours. The patient is more rotated to the left. Possible cardiomegaly which is new since 2020. Other mediastinal contours are within normal limits. Subsequent left lung base hypo ventilation. But elsewhere Allowing for portable technique the lungs are clear. No pneumothorax. No acute osseous abnormality identified. Paucity of bowel gas in the upper abdomen. IMPRESSION: 1. Questionable new or increased cardiomegaly since 2020, associated left lung base atelectasis. 2. No other acute cardiopulmonary abnormality.  Electronically Signed   By: Genevie Ann M.D.   On: 10/01/2020 11:56   ECHOCARDIOGRAM COMPLETE  Result Date: 10/02/2020    ECHOCARDIOGRAM REPORT   Patient Name:   BURLIN GODKIN Date of Exam: 10/01/2020 Medical Rec #:  [id number]   Height:       69.0 in Accession #:    [id number]  Weight:       180.0 lb Date of Birth:  Apr 19, 1934   BSA:          1.976 m Patient Age:    18 years    BP:           127/61 mmHg Patient Gender: M           HR:           80 bpm. Exam Location:  [facility] Procedure: 2D Echo, Cardiac Doppler and Color Doppler Indications:     NSTEMI I21.4  History:         Patient has prior history of Echocardiogram examinations.                  Stroke; Risk Factors:Hypertension.  Sonographer:     Alyse Low Roar Referring Phys:  Unknown Foley NIU Diagnosing Phys: Nelva Bush MD IMPRESSIONS  1. Left ventricular ejection fraction, by estimation, is 25 to 30%. The left ventricle has severely decreased function. The left ventricle demonstrates global hypokinesis. There is mild left ventricular hypertrophy. Left ventricular diastolic parameters  are indeterminate.  2. Right ventricular systolic function is low normal. The right ventricular size is normal. There is moderately elevated pulmonary artery systolic pressure.  3. Suspect ASD present with left-to-right shunting, incompletely imaged. Evidence of atrial level shunting detected by color flow Doppler.  4. The mitral valve is abnormal. Mild mitral valve regurgitation. No evidence of mitral stenosis.  5. The aortic valve is tricuspid. There is mild calcification of the aortic valve. There is mild thickening of the aortic valve. Aortic valve regurgitation is not visualized.  Mild to moderate aortic valve sclerosis/calcification is present, without any evidence of aortic stenosis.  6. Mildly dilated pulmonary artery.  7. The inferior vena cava is dilated in size with <50% respiratory variability, suggesting right atrial pressure of 15 mmHg. FINDINGS  Left Ventricle: Left  ventricular ejection fraction, by estimation, is 25 to 30%. The left ventricle has severely decreased function. The left ventricle demonstrates global hypokinesis. The left ventricular internal cavity size was normal in size. There is mild left ventricular hypertrophy. Left ventricular diastolic parameters are indeterminate. Right Ventricle: The right ventricular size is normal. No increase in right ventricular wall thickness. Right ventricular systolic function is low normal. There is moderately elevated pulmonary artery systolic pressure. The tricuspid regurgitant velocity  is 3.08 m/s, and with an assumed right atrial pressure of 15 mmHg, the estimated right ventricular systolic pressure is [id number] mmHg. Left Atrium: Left atrial size was normal in size. Right Atrium: Right atrial size was normal in size. Pericardium: Trivial pericardial effusion is present. Presence of pericardial fat pad. Mitral Valve: The mitral valve is abnormal. There is mild thickening of the mitral valve leaflet(s). There is mild calcification of the mitral valve leaflet(s). Mild mitral annular calcification. Mild mitral valve regurgitation. No evidence of mitral valve stenosis. Tricuspid Valve: The tricuspid valve is normal in structure. Tricuspid valve regurgitation is mild. Aortic Valve: The aortic valve is tricuspid. There is mild calcification of the aortic valve. There is mild thickening of the aortic valve. Aortic valve regurgitation is not visualized. Mild to moderate aortic valve sclerosis/calcification is present, without any evidence of aortic stenosis. Aortic valve peak gradient measures 6.2 mmHg. Pulmonic Valve: The pulmonic valve was normal in structure. Pulmonic valve regurgitation is not visualized. No evidence of pulmonic stenosis. Aorta: The aortic root is normal in size and structure. Pulmonary Artery: The pulmonary artery is mildly dilated. Venous: The inferior vena cava is dilated in size with less than 50% respiratory  variability, suggesting right atrial pressure of 15 mmHg. IAS/Shunts: Evidence of atrial level shunting detected by color flow Doppler.  LEFT VENTRICLE PLAX 2D LVIDd:         4.90 cm      Diastology LVIDs:         4.20 cm      LV e' medial:    12.30 cm/s LV PW:         1.20 cm      LV E/e' medial:  9.0 LV IVS:        1.30 cm      LV e' lateral:   11.10 cm/s LVOT diam:     2.10 cm      LV E/e' lateral: 10.0 LVOT Area:     3.46 cm  LV Volumes (MOD) LV vol d, MOD A2C: 107.0 ml LV vol d, MOD A4C: 106.0 ml LV vol s, MOD A2C: 73.0 ml LV vol s, MOD A4C: 74.3 ml LV SV MOD A2C:     34.0 ml LV SV MOD A4C:     106.0 ml LV SV MOD BP:      31.9 ml RIGHT VENTRICLE RV Mid diam:    2.80 cm RV S prime:     10.90 cm/s TAPSE (M-mode): 1.9 cm LEFT ATRIUM             Index       RIGHT ATRIUM           Index LA diam:        4.00 cm 2.02  cm/m  RA Area:     16.90 cm LA Vol (A2C):   64.6 ml 32.70 ml/m RA Volume:   46.10 ml  23.34 ml/m LA Vol (A4C):   41.0 ml 20.75 ml/m LA Biplane Vol: 51.9 ml 26.27 ml/m  AORTIC VALVE                PULMONIC VALVE AV Area (Vmax): 1.95 cm    PV Vmax:       1.13 m/s AV Vmax:        124.00 cm/s PV Peak grad:  5.1 mmHg AV Peak Grad:   6.2 mmHg LVOT Vmax:      69.80 cm/s  AORTA Ao Root diam: 2.80 cm MITRAL VALVE                TRICUSPID VALVE MV Area (PHT): 6.37 cm     TR Peak grad:   37.9 mmHg MV Decel Time: 119 msec     TR Vmax:        308.00 cm/s MV E velocity: 111.00 cm/s                             SHUNTS                             Systemic Diam: 2.10 cm Nelva Bush MD Electronically signed by Nelva Bush MD Signature Date/Time: 10/02/2020/7:17:48 AM    Final    IR NEPHROSTOMY PLACEMENT LEFT  Result Date: 10/01/2020 INDICATION: 87 year old male presenting with presumed urinary sepsis and non ST elevated myocardial infarction, bladder mass suspected. EXAM: 1. ULTRASOUND GUIDANCE FOR PUNCTURE OF THE LEFT RENAL COLLECTING SYSTEM 2. LEFT PERCUTANEOUS NEPHROSTOMY TUBE PLACEMENT. COMPARISON:   None. MEDICATIONS: The patient is receiving appropriate inpatient antibiotic coverage prior to procedure. ANESTHESIA/SEDATION: Moderate (conscious) sedation was employed during this procedure. A total of Versed 0 mg and Fentanyl 25 mcg was administered intravenously. Moderate Sedation Time: 0 minutes. The patient's level of consciousness and vital signs were monitored continuously by radiology nursing throughout the procedure under my direct supervision. CONTRAST:  Twenty mL Isovue 300 - administered into the renal collecting system FLUOROSCOPY TIME:  0.8 minutes, 123456 mGy COMPLICATIONS: None immediate. PROCEDURE: The procedure, risks, benefits, and alternatives were explained to the patient. Questions regarding the procedure were encouraged and answered. The patient understands and consents to the procedure. A timeout was performed prior to the initiation of the procedure. The left flank region was prepped and draped in the usual sterile fashion and a sterile drape was applied covering the operative field. A sterile gown and sterile gloves were used for the procedure. Local anesthesia was provided with 1% Lidocaine. Ultrasound was used to localize the left kidney. Under direct ultrasound guidance, a 20 gauge needle was advanced into the renal collecting system. An ultrasound image documentation was performed. Access within the collecting system was confirmed with the efflux of urine followed by limited contrast injection. Over a Nitrex wire, the tract was dilated with an Accustick stent. Next, under intermittent fluoroscpic guidance and over a short Amplatz wire, the track was dilated ultimately allowing placement of a 10.2 percutaneous nephrostomy catheter which was advanced to the level of the renal pelvis where the coil was formed and locked. Contrast was injected and several spot fluoroscopic images were obtained in various obliquities. The catheter was secured at the skin with a 0 silk retention suture and  stat lock device and connected to a gravity bag was placed. Dressings were applied. The patient tolerated procedure well without immediate postprocedural complication. FINDINGS: Ultrasound scanning demonstrates a moderately dilated left collecting system. Under a combination of ultrasound and fluoroscopic guidance, a posterior inferior calix was targeted allowing placement of a 10.2 percutaneous nephrostomy catheter with end coiled and locked within the renal pelvis. Contrast injection confirmed appropriate positioning. Contrast does not travel beyond the mid ureter. IMPRESSION: Successful ultrasound and fluoroscopic guided placement of a left sided 10.2 [place] PCN. PLAN: Interventional Radiology will arrange for routine check and exchange of indwelling nephrostomy in 4-6 weeks. Ruthann Cancer, MD [facility] Electronically Signed   By: Ruthann Cancer MD   On: 10/01/2020 16:21        Scheduled Meds: . aspirin EC  81 mg Oral Daily  . atorvastatin  40 mg Oral Daily  . carbidopa-levodopa  1 tablet Oral TID  . carvedilol  3.125 mg Oral BID WC  . divalproex  250 mg Oral BID  . donepezil  10 mg Oral QHS  . escitalopram  5 mg Oral Daily  . insulin aspart  0-5 Units Subcutaneous QHS  . insulin aspart  0-9 Units Subcutaneous TID WC  . iodixanol  50 mL Other Once  . sodium chloride flush  5 mL Intracatheter Q8H   Continuous Infusions: . cefTRIAXone (ROCEPHIN)  IV Stopped (10/03/20 0541)  . heparin 1,300 Units/hr (10/03/20 0602)     LOS: 2 days    Time spent: 34 minutes    Desma Maxim, MD [facility]   To contact the attending provider between 7A-7P or the covering provider during after hours 7P-7A, please log into the web site [email or web address] and access using universal [place] password for that web site. If you do not have the password, please call the hospital operator.  10/03/2020, 8:34 AM

## 2020-10-03 NOTE — Progress Notes (Signed)
Urology Consult Follow Up  Subjective: Patient sleeping soundly.  He appears pale.    Nephrostomy is draining clear yellow urine and condom cath collecting bloody urine.   VSS afebrile  His hemoglobin continues to drop and is now at 7.4 from 8.7 yesterday.  His serum creatinine has mild improvement from 4.03 yesterday to 3.89 today.  His PSA is 0.23.  His urine cytology is still pending.  His troponins peaked at 2102 and has been seen by cardiology.  They have recommended not to pursue heart catheterization due to his multiple medical problems including the renal failure and bacteremia.  Urine culture preliminary results positive for greater than 100,000 colonies of Klebsiella pneumoniae susceptibilities to follow.  Blood cultures negative grow after 2 days.   Bladder scan yesterday was 79 cc.    Spoke with his daughter this afternoon.  She states that he has been resting comfortably, mostly sleeping, and is not having pain.     Anti-infectives: Anti-infectives (From admission, onward)   Start     Dose/Rate Route Frequency Ordered Stop   10/02/20 0400  cefTRIAXone (ROCEPHIN) 2 g in sodium chloride 0.9 % 100 mL IVPB        2 g 200 mL/hr over 30 Minutes Intravenous Every 24 hours 10/02/20 0320     10/01/20 1215  cefTRIAXone (ROCEPHIN) 1 g in sodium chloride 0.9 % 100 mL IVPB  Status:  Discontinued        1 g 200 mL/hr over 30 Minutes Intravenous Every 24 hours 10/01/20 1206 10/02/20 0319      Current Facility-Administered Medications  Medication Dose Route Frequency Provider Last Rate Last Admin  . albuterol (PROVENTIL) (2.5 MG/3ML) 0.083% nebulizer solution 2.5 mg  2.5 mg Nebulization Q4H PRN Ivor Costa, MD      . aspirin EC tablet 81 mg  81 mg Oral Daily Ivor Costa, MD   81 mg at 10/03/20 [id number]  . atorvastatin (LIPITOR) tablet 40 mg  40 mg Oral Daily Ivor Costa, MD   40 mg at 10/03/20 [id number]  . carbidopa-levodopa (SINEMET IR) 25-100 MG per tablet immediate release 1 tablet  1 tablet  Oral TID Ivor Costa, MD   1 tablet at 10/03/20 [phone]  . carvedilol (COREG) tablet 3.125 mg  3.125 mg Oral BID WC Visser, Jacquelyn D, PA-C   3.125 mg at 10/03/20 0917  . cefTRIAXone (ROCEPHIN) 2 g in sodium chloride 0.9 % 100 mL IVPB  2 g Intravenous Q24H Sharion Settler, NP   Stopped at 10/03/20 0541  . dextromethorphan-guaiFENesin (MUCINEX DM) 30-600 MG per 12 hr tablet 1 tablet  1 tablet Oral BID PRN Ivor Costa, MD      . dextrose 5 %-0.45 % sodium chloride infusion   Intravenous Continuous Gwynne Edinger, MD 75 mL/hr at 10/03/20 0917 New Bag at 10/03/20 0917  . divalproex (DEPAKOTE) DR tablet 250 mg  250 mg Oral BID Ivor Costa, MD   250 mg at 10/03/20 [id number]  . donepezil (ARICEPT) tablet 10 mg  10 mg Oral QHS Ivor Costa, MD   10 mg at 10/02/20 2205  . escitalopram (LEXAPRO) tablet 5 mg  5 mg Oral Daily Ivor Costa, MD   5 mg at 10/03/20 [id number]  . insulin aspart (novoLOG) injection 0-5 Units  0-5 Units Subcutaneous QHS Ivor Costa, MD      . insulin aspart (novoLOG) injection 0-9 Units  0-9 Units Subcutaneous TID WC Ivor Costa, MD   1 Units at 10/03/20 [phone]  . iodixanol (VISIPAQUE)  320 MG/ML injection 50 mL  50 mL Other Once Suttle, Dylan J, MD      . loperamide (IMODIUM) capsule 2 mg  2 mg Oral PRN Ivor Costa, MD      . ondansetron [facility]) injection 4 mg  4 mg Intravenous Q6H PRN Sharion Settler, NP   4 mg at 10/01/20 2058  . oxyCODONE-acetaminophen (PERCOCET/ROXICET) 5-325 MG per tablet 1 tablet  1 tablet Oral Q4H PRN Ivor Costa, MD      . sodium chloride flush (NS) 0.9 % injection 5 mL  5 mL Intracatheter Q8H Suttle, Rosanne Ashing, MD   5 mL at 10/03/20 0512     Objective: Vital signs in last 24 hours: Temp:  [97.6 F (36.4 C)-98.7 F (37.1 C)] 97.6 F (36.4 C) (03/02 0720) Pulse Rate:  [62-72] 62 (03/02 0720) Resp:  [15-19] 18 (03/02 0720) BP: (112-139)/(60-67) 137/64 (03/02 0720) SpO2:  [98 %-100 %] 100 % (03/02 0720) Weight:  [70 kg] 70 kg (03/02 0444)  Intake/Output from previous  day: 03/01 0701 - 03/02 0700 In: 781 [P.O.:300; I.V.:260.4; IV Piggyback:220.6] Out: 1050 [Urine:550; Drains:500] Intake/Output this shift: No intake/output data recorded.   Physical Exam Constitutional:  Pale.  Somnolent.  Not orientated. HEENT: [place] AT, moist mucus membranes.  Trachea midline Cardiovascular: No clubbing, cyanosis, or edema. Respiratory: Normal respiratory effort, no increased work of breathing. GU: No CVA tenderness.  No bladder fullness or masses.   Neurologic: Grossly intact, no focal deficits, moving all 4 extremities. Psychiatric: Normal mood and affect.  Lab Results:  Recent Labs    10/02/20 1124 10/03/20 0626  WBC 9.8 7.7  HGB 8.7* 7.4*  HCT 26.7* 23.1*  PLT 176 172   BMET Recent Labs    10/02/20 1034 10/03/20 0626  NA 138 142  K 4.8 4.6  CL 104 107  CO2 21* 24  GLUCOSE 172* 132*  BUN 58* 69*  CREATININE 4.03* 3.89*  CALCIUM 7.6* 7.6*   PT/INR Recent Labs    10/01/20 1124  LABPROT 15.2  INR 1.2   ABG No results for input(s): PHART, HCO3 in the last 72 hours.  Invalid input(s): PCO2, PO2  Studies/Results: CT ABDOMEN PELVIS WO CONTRAST  Result Date: 10/01/2020 CLINICAL DATA:  Abdominal pain and hematuria EXAM: CT ABDOMEN AND PELVIS WITHOUT CONTRAST TECHNIQUE: Multidetector CT imaging of the abdomen and pelvis was performed following the standard protocol without IV contrast. COMPARISON:  Ultrasound from 07/12/2018 FINDINGS: Lower chest: Small left pleural effusion is noted with mild bibasilar atelectasis. Hepatobiliary: No focal liver abnormality is seen. No gallstones, gallbladder wall thickening, or biliary dilatation. Pancreas: Hypodensity in the region of the head of the pancreas which corresponds to a cystic lesion seen on the prior ultrasound examination. It measures approximately 15 mm. The remainder of the pancreas is within normal limits. Spleen: Normal in size without focal abnormality. Adrenals/Urinary Tract: Left adrenal gland  is within normal limits. Right adrenal gland demonstrates a small 15 mm lesion best seen on image number 29 of series 2. Statistically this likely represents an adenoma. Right kidney is well visualized without obstructive change or renal calculi. Left kidney demonstrates evidence of small nonobstructing lower pole renal stone as well as hydronephrosis and hydroureter which extends to the level of the urinary bladder. No obstructing stone is noted. The bladder is decompressed and irregular hyperdense material is noted within the bladder likely representing thrombus given the clinical history. Underlying mass lesion at the bladder trigone may be present given the left-sided hydronephrosis.  Stomach/Bowel: No obstructive or inflammatory changes of the colon are seen. The appendix is within normal limits. No small bowel or gastric abnormality is seen. Vascular/Lymphatic: Aortic atherosclerosis. No enlarged abdominal or pelvic lymph nodes. Reproductive: Prostate is unremarkable. Other: No abdominal wall hernia or abnormality. No abdominopelvic ascites. Musculoskeletal: Degenerative changes of lumbar spine are noted. IMPRESSION: Left-sided hydronephrosis is noted without obstructing renal stone. This raises suspicion for possible bladder mass. Additionally hyperdense material is noted within the bladder consistent with thrombus. Direct visualization is recommended for further evaluation. Nonobstructing lower pole left renal stone. Stable hypodensity within the head of the pancreas unchanged from 2019. Small right renal lesion likely representing an adenoma but incompletely evaluated on this exam. No other focal abnormality is noted. Electronically Signed   By: Inez Catalina M.D.   On: 10/01/2020 13:25   CT Head Wo Contrast  Result Date: 10/01/2020 CLINICAL DATA:  Mental status changes. EXAM: CT HEAD WITHOUT CONTRAST TECHNIQUE: Contiguous axial images were obtained from the base of the skull through the vertex without  intravenous contrast. COMPARISON:  06/14/2020 FINDINGS: Brain: There is no evidence for acute hemorrhage, hydrocephalus, mass lesion, or abnormal extra-axial fluid collection. No definite CT evidence for acute infarction. Diffuse loss of parenchymal volume is consistent with atrophy. Patchy low attenuation in the deep hemispheric and periventricular white matter is nonspecific, but likely reflects chronic microvascular ischemic demyelination. Nonacute left parietooccipital infarct is new since prior. Vascular: No hyperdense vessel or unexpected calcification. Skull: No evidence for fracture. No worrisome lytic or sclerotic lesion. Sinuses/Orbits: The visualized paranasal sinuses and mastoid air cells are clear. Visualized portions of the globes and intraorbital fat are unremarkable. Other: None. IMPRESSION: 1. No acute intracranial abnormality. 2. Nonacute left parietooccipital infarct, new since prior. 3. Atrophy with chronic small vessel white matter ischemic disease. Electronically Signed   By: Misty Stanley M.D.   On: 10/01/2020 13:25   [place] Intraoperative  Result Date: 10/01/2020 CLINICAL DATA:  Ultrasound was provided for use by the ordering physician.  No provider Interpretation or professional fees incurred.    DG Chest Portable 1 View  Result Date: 10/01/2020 CLINICAL DATA:  87 year old male with altered mental status onset this morning. EXAM: PORTABLE CHEST 1 VIEW COMPARISON:  Portable chest 11/25/2018 and earlier. FINDINGS: Portable AP semi upright view at 1137 hours. The patient is more rotated to the left. Possible cardiomegaly which is new since 2020. Other mediastinal contours are within normal limits. Subsequent left lung base hypo ventilation. But elsewhere Allowing for portable technique the lungs are clear. No pneumothorax. No acute osseous abnormality identified. Paucity of bowel gas in the upper abdomen. IMPRESSION: 1. Questionable new or increased cardiomegaly since 2020, associated left  lung base atelectasis. 2. No other acute cardiopulmonary abnormality. Electronically Signed   By: Genevie Ann M.D.   On: 10/01/2020 11:56   ECHOCARDIOGRAM COMPLETE  Result Date: 10/02/2020    ECHOCARDIOGRAM REPORT   Patient Name:   TALLY EHLKE Date of Exam: 10/01/2020 Medical Rec #:  [id number]   Height:       69.0 in Accession #:    [id number]  Weight:       180.0 lb Date of Birth:  May 27, 1934   BSA:          1.976 m Patient Age:    87 years    BP:           127/61 mmHg Patient Gender: M           HR:  80 bpm. Exam Location:  [facility] Procedure: 2D Echo, Cardiac Doppler and Color Doppler Indications:     NSTEMI I21.4  History:         Patient has prior history of Echocardiogram examinations.                  Stroke; Risk Factors:Hypertension.  Sonographer:     Alyse Low Roar Referring Phys:  Unknown Foley NIU Diagnosing Phys: Nelva Bush MD IMPRESSIONS  1. Left ventricular ejection fraction, by estimation, is 25 to 30%. The left ventricle has severely decreased function. The left ventricle demonstrates global hypokinesis. There is mild left ventricular hypertrophy. Left ventricular diastolic parameters  are indeterminate.  2. Right ventricular systolic function is low normal. The right ventricular size is normal. There is moderately elevated pulmonary artery systolic pressure.  3. Suspect ASD present with left-to-right shunting, incompletely imaged. Evidence of atrial level shunting detected by color flow Doppler.  4. The mitral valve is abnormal. Mild mitral valve regurgitation. No evidence of mitral stenosis.  5. The aortic valve is tricuspid. There is mild calcification of the aortic valve. There is mild thickening of the aortic valve. Aortic valve regurgitation is not visualized. Mild to moderate aortic valve sclerosis/calcification is present, without any evidence of aortic stenosis.  6. Mildly dilated pulmonary artery.  7. The inferior vena cava is dilated in size with <50% respiratory variability,  suggesting right atrial pressure of 15 mmHg. FINDINGS  Left Ventricle: Left ventricular ejection fraction, by estimation, is 25 to 30%. The left ventricle has severely decreased function. The left ventricle demonstrates global hypokinesis. The left ventricular internal cavity size was normal in size. There is mild left ventricular hypertrophy. Left ventricular diastolic parameters are indeterminate. Right Ventricle: The right ventricular size is normal. No increase in right ventricular wall thickness. Right ventricular systolic function is low normal. There is moderately elevated pulmonary artery systolic pressure. The tricuspid regurgitant velocity  is 3.08 m/s, and with an assumed right atrial pressure of 15 mmHg, the estimated right ventricular systolic pressure is [id number] mmHg. Left Atrium: Left atrial size was normal in size. Right Atrium: Right atrial size was normal in size. Pericardium: Trivial pericardial effusion is present. Presence of pericardial fat pad. Mitral Valve: The mitral valve is abnormal. There is mild thickening of the mitral valve leaflet(s). There is mild calcification of the mitral valve leaflet(s). Mild mitral annular calcification. Mild mitral valve regurgitation. No evidence of mitral valve stenosis. Tricuspid Valve: The tricuspid valve is normal in structure. Tricuspid valve regurgitation is mild. Aortic Valve: The aortic valve is tricuspid. There is mild calcification of the aortic valve. There is mild thickening of the aortic valve. Aortic valve regurgitation is not visualized. Mild to moderate aortic valve sclerosis/calcification is present, without any evidence of aortic stenosis. Aortic valve peak gradient measures 6.2 mmHg. Pulmonic Valve: The pulmonic valve was normal in structure. Pulmonic valve regurgitation is not visualized. No evidence of pulmonic stenosis. Aorta: The aortic root is normal in size and structure. Pulmonary Artery: The pulmonary artery is mildly dilated. Venous:  The inferior vena cava is dilated in size with less than 50% respiratory variability, suggesting right atrial pressure of 15 mmHg. IAS/Shunts: Evidence of atrial level shunting detected by color flow Doppler.  LEFT VENTRICLE PLAX 2D LVIDd:         4.90 cm      Diastology LVIDs:         4.20 cm      LV e' medial:  12.30 cm/s LV PW:         1.20 cm      LV E/e' medial:  9.0 LV IVS:        1.30 cm      LV e' lateral:   11.10 cm/s LVOT diam:     2.10 cm      LV E/e' lateral: 10.0 LVOT Area:     3.46 cm  LV Volumes (MOD) LV vol d, MOD A2C: 107.0 ml LV vol d, MOD A4C: 106.0 ml LV vol s, MOD A2C: 73.0 ml LV vol s, MOD A4C: 74.3 ml LV SV MOD A2C:     34.0 ml LV SV MOD A4C:     106.0 ml LV SV MOD BP:      31.9 ml RIGHT VENTRICLE RV Mid diam:    2.80 cm RV S prime:     10.90 cm/s TAPSE (M-mode): 1.9 cm LEFT ATRIUM             Index       RIGHT ATRIUM           Index LA diam:        4.00 cm 2.02 cm/m  RA Area:     16.90 cm LA Vol (A2C):   64.6 ml 32.70 ml/m RA Volume:   46.10 ml  23.34 ml/m LA Vol (A4C):   41.0 ml 20.75 ml/m LA Biplane Vol: 51.9 ml 26.27 ml/m  AORTIC VALVE                PULMONIC VALVE AV Area (Vmax): 1.95 cm    PV Vmax:       1.13 m/s AV Vmax:        124.00 cm/s PV Peak grad:  5.1 mmHg AV Peak Grad:   6.2 mmHg LVOT Vmax:      69.80 cm/s  AORTA Ao Root diam: 2.80 cm MITRAL VALVE                TRICUSPID VALVE MV Area (PHT): 6.37 cm     TR Peak grad:   37.9 mmHg MV Decel Time: 119 msec     TR Vmax:        308.00 cm/s MV E velocity: 111.00 cm/s                             SHUNTS                             Systemic Diam: 2.10 cm Nelva Bush MD Electronically signed by Nelva Bush MD Signature Date/Time: 10/02/2020/7:17:48 AM    Final    IR NEPHROSTOMY PLACEMENT LEFT  Result Date: 10/01/2020 INDICATION: 87 year old male presenting with presumed urinary sepsis and non ST elevated myocardial infarction, bladder mass suspected. EXAM: 1. ULTRASOUND GUIDANCE FOR PUNCTURE OF THE LEFT RENAL  COLLECTING SYSTEM 2. LEFT PERCUTANEOUS NEPHROSTOMY TUBE PLACEMENT. COMPARISON:  None. MEDICATIONS: The patient is receiving appropriate inpatient antibiotic coverage prior to procedure. ANESTHESIA/SEDATION: Moderate (conscious) sedation was employed during this procedure. A total of Versed 0 mg and Fentanyl 25 mcg was administered intravenously. Moderate Sedation Time: 0 minutes. The patient's level of consciousness and vital signs were monitored continuously by radiology nursing throughout the procedure under my direct supervision. CONTRAST:  Twenty mL Isovue 300 - administered into the renal collecting system FLUOROSCOPY TIME:  0.8 minutes, [id number] mGy COMPLICATIONS: None immediate. PROCEDURE: The procedure,  risks, benefits, and alternatives were explained to the patient. Questions regarding the procedure were encouraged and answered. The patient understands and consents to the procedure. A timeout was performed prior to the initiation of the procedure. The left flank region was prepped and draped in the usual sterile fashion and a sterile drape was applied covering the operative field. A sterile gown and sterile gloves were used for the procedure. Local anesthesia was provided with 1% Lidocaine. Ultrasound was used to localize the left kidney. Under direct ultrasound guidance, a 20 gauge needle was advanced into the renal collecting system. An ultrasound image documentation was performed. Access within the collecting system was confirmed with the efflux of urine followed by limited contrast injection. Over a Nitrex wire, the tract was dilated with an Accustick stent. Next, under intermittent fluoroscpic guidance and over a short Amplatz wire, the track was dilated ultimately allowing placement of a 10.2 percutaneous nephrostomy catheter which was advanced to the level of the renal pelvis where the coil was formed and locked. Contrast was injected and several spot fluoroscopic images were obtained in various  obliquities. The catheter was secured at the skin with a 0 silk retention suture and stat lock device and connected to a gravity bag was placed. Dressings were applied. The patient tolerated procedure well without immediate postprocedural complication. FINDINGS: Ultrasound scanning demonstrates a moderately dilated left collecting system. Under a combination of ultrasound and fluoroscopic guidance, a posterior inferior calix was targeted allowing placement of a 10.2 percutaneous nephrostomy catheter with end coiled and locked within the renal pelvis. Contrast injection confirmed appropriate positioning. Contrast does not travel beyond the mid ureter. IMPRESSION: Successful ultrasound and fluoroscopic guided placement of a left sided 10.2 [place] PCN. PLAN: Interventional Radiology will arrange for routine check and exchange of indwelling nephrostomy in 4-6 weeks. Ruthann Cancer, MD [facility] Radiology Electronically Signed   By: Ruthann Cancer MD   On: 10/01/2020 16:21     Assessment and Plan:  87 year old comorbid gentleman with dementia admitted for management of sepsis, likely from urinary source of left-sided hydronephrosis which may be due to an obstructing mass who is now status post left nephrostomy tube placement.  PSA 0.23 so the underlying bladder mass is not due to prostate cancer.  Urine cytology still pending.   Recommendations: - continue to trend serum creatinine - if it should worsen, consider renal ultrasound to assess for retention, resolution of hydronephrosis and clot burden - continue to monitor UOP and if worsens - consider renal ultrasound  - will need cystoscopy as an outpatient - Urine cytology has not been sent - will remedy today - will continue to follow           LOS: 2 days    [facility] 10/03/2020

## 2020-10-03 NOTE — Progress Notes (Addendum)
Progress Note  Patient Name: Ian Jimenez Date of Encounter: 10/03/2020  [facility] Cardiologist: New- End  Subjective   Hgb dropped into the 8.7>7.4, he is on IV heparin. Blood in his urine overnight. Creatinine mildly improved.  Patient opened his eyes during interview and able to follow instructions. Family member reports he has been sleeping most of the time. Pale on exam. He denies chest pain.   Inpatient Medications    Scheduled Meds: . aspirin EC  81 mg Oral Daily  . atorvastatin  40 mg Oral Daily  . carbidopa-levodopa  1 tablet Oral TID  . carvedilol  3.125 mg Oral BID WC  . divalproex  250 mg Oral BID  . donepezil  10 mg Oral QHS  . escitalopram  5 mg Oral Daily  . insulin aspart  0-5 Units Subcutaneous QHS  . insulin aspart  0-9 Units Subcutaneous TID WC  . iodixanol  50 mL Other Once  . sodium chloride flush  5 mL Intracatheter Q8H   Continuous Infusions: . cefTRIAXone (ROCEPHIN)  IV Stopped (10/03/20 0541)  . heparin 1,300 Units/hr (10/03/20 0602)   PRN Meds: albuterol, dextromethorphan-guaiFENesin, loperamide, morphine injection, ondansetron (ZOFRAN) IV, oxyCODONE-acetaminophen   Vital Signs    Vitals:   10/02/20 1632 10/02/20 2009 10/03/20 0444 10/03/20 0720  BP: 139/63 112/60 136/67 137/64  Pulse: 72 68 64 62  Resp: '15 19 18 18  '$ Temp: 98.1 F (36.7 C) 98.7 F (37.1 C) 98.2 F (36.8 C) 97.6 F (36.4 C)  TempSrc:  Oral Oral Oral  SpO2: 98% 98% 100% 100%  Weight:   70 kg   Height:        Intake/Output Summary (Last 24 hours) at 10/03/2020 0814 Last data filed at 10/03/2020 0602 Gross per 24 hour  Intake 781.04 ml  Output 1050 ml  Net -268.96 ml   Last 3 Weights 10/03/2020 10/02/2020 10/01/2020  Weight (lbs) 154 lb 6.4 oz 164 lb 14.4 oz 157 lb 3 oz  Weight (kg) 70.035 kg 74.798 kg 71.3 kg      Telemetry    SR, HR 60s, brief 1st degree AV block - Personally Reviewed  ECG    No new - Personally Reviewed  Physical Exam   GEN: No acute  distress.   Neck: No JVD Cardiac: RRR, no murmurs, rubs, or gallops.  Respiratory: decreased breath sounds GI: Soft, nontender, non-distended  MS: No edema; No deformity. Neuro:  Nonfocal  Psych: Normal affect   Labs    High Sensitivity Troponin:   Recent Labs  Lab 10/01/20 1124 10/01/20 1246 10/01/20 1525 10/01/20 2040 10/02/20 0725  TROPONINIHS 206* 235* 400* 1,511* 2,102*      Chemistry Recent Labs  Lab 10/01/20 1124 10/02/20 1034 10/03/20 0626  NA 140 138 142  K 4.2 4.8 4.6  CL 104 104 107  CO2 23 21* 24  GLUCOSE 184* 172* 132*  BUN 48* 58* 69*  CREATININE 3.98* 4.03* 3.89*  CALCIUM 8.2* 7.6* 7.6*  PROT 6.5  --   --   ALBUMIN 2.7*  --   --   AST 19  --   --   ALT 7  --   --   ALKPHOS 54  --   --   BILITOT 0.7  --   --   GFRNONAA 14* 14* 15*  ANIONGAP '13 13 11     '$ Hematology Recent Labs  Lab 10/02/20 0725 10/02/20 1124 10/03/20 0626  WBC 10.9* 9.8 7.7  RBC 2.73* 2.89* 2.49*  HGB 8.0* 8.7* 7.4*  HCT 25.0* 26.7* 23.1*  MCV 91.6 92.4 92.8  MCH 29.3 30.1 29.7  MCHC 32.0 32.6 32.0  RDW 13.8 14.1 13.7  PLT 196 176 172    BNP Recent Labs  Lab 10/01/20 1124  BNP 2,334.2*     DDimer No results for input(s): DDIMER in the last 168 hours.   Radiology    CT ABDOMEN PELVIS WO CONTRAST  Result Date: 10/01/2020 CLINICAL DATA:  Abdominal pain and hematuria EXAM: CT ABDOMEN AND PELVIS WITHOUT CONTRAST TECHNIQUE: Multidetector CT imaging of the abdomen and pelvis was performed following the standard protocol without IV contrast. COMPARISON:  Ultrasound from 07/12/2018 FINDINGS: Lower chest: Small left pleural effusion is noted with mild bibasilar atelectasis. Hepatobiliary: No focal liver abnormality is seen. No gallstones, gallbladder wall thickening, or biliary dilatation. Pancreas: Hypodensity in the region of the head of the pancreas which corresponds to a cystic lesion seen on the prior ultrasound examination. It measures approximately 15 mm. The  remainder of the pancreas is within normal limits. Spleen: Normal in size without focal abnormality. Adrenals/Urinary Tract: Left adrenal gland is within normal limits. Right adrenal gland demonstrates a small 15 mm lesion best seen on image number 29 of series 2. Statistically this likely represents an adenoma. Right kidney is well visualized without obstructive change or renal calculi. Left kidney demonstrates evidence of small nonobstructing lower pole renal stone as well as hydronephrosis and hydroureter which extends to the level of the urinary bladder. No obstructing stone is noted. The bladder is decompressed and irregular hyperdense material is noted within the bladder likely representing thrombus given the clinical history. Underlying mass lesion at the bladder trigone may be present given the left-sided hydronephrosis. Stomach/Bowel: No obstructive or inflammatory changes of the colon are seen. The appendix is within normal limits. No small bowel or gastric abnormality is seen. Vascular/Lymphatic: Aortic atherosclerosis. No enlarged abdominal or pelvic lymph nodes. Reproductive: Prostate is unremarkable. Other: No abdominal wall hernia or abnormality. No abdominopelvic ascites. Musculoskeletal: Degenerative changes of lumbar spine are noted. IMPRESSION: Left-sided hydronephrosis is noted without obstructing renal stone. This raises suspicion for possible bladder mass. Additionally hyperdense material is noted within the bladder consistent with thrombus. Direct visualization is recommended for further evaluation. Nonobstructing lower pole left renal stone. Stable hypodensity within the head of the pancreas unchanged from 2019. Small right renal lesion likely representing an adenoma but incompletely evaluated on this exam. No other focal abnormality is noted. Electronically Signed   By: Inez Catalina M.D.   On: 10/01/2020 13:25   CT Head Wo Contrast  Result Date: 10/01/2020 CLINICAL DATA:  Mental status  changes. EXAM: CT HEAD WITHOUT CONTRAST TECHNIQUE: Contiguous axial images were obtained from the base of the skull through the vertex without intravenous contrast. COMPARISON:  06/14/2020 FINDINGS: Brain: There is no evidence for acute hemorrhage, hydrocephalus, mass lesion, or abnormal extra-axial fluid collection. No definite CT evidence for acute infarction. Diffuse loss of parenchymal volume is consistent with atrophy. Patchy low attenuation in the deep hemispheric and periventricular white matter is nonspecific, but likely reflects chronic microvascular ischemic demyelination. Nonacute left parietooccipital infarct is new since prior. Vascular: No hyperdense vessel or unexpected calcification. Skull: No evidence for fracture. No worrisome lytic or sclerotic lesion. Sinuses/Orbits: The visualized paranasal sinuses and mastoid air cells are clear. Visualized portions of the globes and intraorbital fat are unremarkable. Other: None. IMPRESSION: 1. No acute intracranial abnormality. 2. Nonacute left parietooccipital infarct, new since prior. 3. Atrophy with  chronic small vessel white matter ischemic disease. Electronically Signed   By: Misty Stanley M.D.   On: 10/01/2020 13:25   [place] Intraoperative  Result Date: 10/01/2020 CLINICAL DATA:  Ultrasound was provided for use by the ordering physician.  No provider Interpretation or professional fees incurred.    DG Chest Portable 1 View  Result Date: 10/01/2020 CLINICAL DATA:  87 year old male with altered mental status onset this morning. EXAM: PORTABLE CHEST 1 VIEW COMPARISON:  Portable chest 11/25/2018 and earlier. FINDINGS: Portable AP semi upright view at 1137 hours. The patient is more rotated to the left. Possible cardiomegaly which is new since 2020. Other mediastinal contours are within normal limits. Subsequent left lung base hypo ventilation. But elsewhere Allowing for portable technique the lungs are clear. No pneumothorax. No acute osseous  abnormality identified. Paucity of bowel gas in the upper abdomen. IMPRESSION: 1. Questionable new or increased cardiomegaly since 2020, associated left lung base atelectasis. 2. No other acute cardiopulmonary abnormality. Electronically Signed   By: Genevie Ann M.D.   On: 10/01/2020 11:56   ECHOCARDIOGRAM COMPLETE  Result Date: 10/02/2020    ECHOCARDIOGRAM REPORT   Patient Name:   LYDEN DEASY Date of Exam: 10/01/2020 Medical Rec #:  [id number]   Height:       69.0 in Accession #:    [id number]  Weight:       180.0 lb Date of Birth:  13-Nov-1933   BSA:          1.976 m Patient Age:    42 years    BP:           127/61 mmHg Patient Gender: M           HR:           80 bpm. Exam Location:  [facility] Procedure: 2D Echo, Cardiac Doppler and Color Doppler Indications:     NSTEMI I21.4  History:         Patient has prior history of Echocardiogram examinations.                  Stroke; Risk Factors:Hypertension.  Sonographer:     Alyse Low Roar Referring Phys:  Unknown Foley NIU Diagnosing Phys: Nelva Bush MD IMPRESSIONS  1. Left ventricular ejection fraction, by estimation, is 25 to 30%. The left ventricle has severely decreased function. The left ventricle demonstrates global hypokinesis. There is mild left ventricular hypertrophy. Left ventricular diastolic parameters  are indeterminate.  2. Right ventricular systolic function is low normal. The right ventricular size is normal. There is moderately elevated pulmonary artery systolic pressure.  3. Suspect ASD present with left-to-right shunting, incompletely imaged. Evidence of atrial level shunting detected by color flow Doppler.  4. The mitral valve is abnormal. Mild mitral valve regurgitation. No evidence of mitral stenosis.  5. The aortic valve is tricuspid. There is mild calcification of the aortic valve. There is mild thickening of the aortic valve. Aortic valve regurgitation is not visualized. Mild to moderate aortic valve sclerosis/calcification is present, without any  evidence of aortic stenosis.  6. Mildly dilated pulmonary artery.  7. The inferior vena cava is dilated in size with <50% respiratory variability, suggesting right atrial pressure of 15 mmHg. FINDINGS  Left Ventricle: Left ventricular ejection fraction, by estimation, is 25 to 30%. The left ventricle has severely decreased function. The left ventricle demonstrates global hypokinesis. The left ventricular internal cavity size was normal in size. There is mild left ventricular hypertrophy. Left ventricular diastolic parameters are indeterminate.  Right Ventricle: The right ventricular size is normal. No increase in right ventricular wall thickness. Right ventricular systolic function is low normal. There is moderately elevated pulmonary artery systolic pressure. The tricuspid regurgitant velocity  is 3.08 m/s, and with an assumed right atrial pressure of 15 mmHg, the estimated right ventricular systolic pressure is [id number] mmHg. Left Atrium: Left atrial size was normal in size. Right Atrium: Right atrial size was normal in size. Pericardium: Trivial pericardial effusion is present. Presence of pericardial fat pad. Mitral Valve: The mitral valve is abnormal. There is mild thickening of the mitral valve leaflet(s). There is mild calcification of the mitral valve leaflet(s). Mild mitral annular calcification. Mild mitral valve regurgitation. No evidence of mitral valve stenosis. Tricuspid Valve: The tricuspid valve is normal in structure. Tricuspid valve regurgitation is mild. Aortic Valve: The aortic valve is tricuspid. There is mild calcification of the aortic valve. There is mild thickening of the aortic valve. Aortic valve regurgitation is not visualized. Mild to moderate aortic valve sclerosis/calcification is present, without any evidence of aortic stenosis. Aortic valve peak gradient measures 6.2 mmHg. Pulmonic Valve: The pulmonic valve was normal in structure. Pulmonic valve regurgitation is not visualized. No  evidence of pulmonic stenosis. Aorta: The aortic root is normal in size and structure. Pulmonary Artery: The pulmonary artery is mildly dilated. Venous: The inferior vena cava is dilated in size with less than 50% respiratory variability, suggesting right atrial pressure of 15 mmHg. IAS/Shunts: Evidence of atrial level shunting detected by color flow Doppler.  LEFT VENTRICLE PLAX 2D LVIDd:         4.90 cm      Diastology LVIDs:         4.20 cm      LV e' medial:    12.30 cm/s LV PW:         1.20 cm      LV E/e' medial:  9.0 LV IVS:        1.30 cm      LV e' lateral:   11.10 cm/s LVOT diam:     2.10 cm      LV E/e' lateral: 10.0 LVOT Area:     3.46 cm  LV Volumes (MOD) LV vol d, MOD A2C: 107.0 ml LV vol d, MOD A4C: 106.0 ml LV vol s, MOD A2C: 73.0 ml LV vol s, MOD A4C: 74.3 ml LV SV MOD A2C:     34.0 ml LV SV MOD A4C:     106.0 ml LV SV MOD BP:      31.9 ml RIGHT VENTRICLE RV Mid diam:    2.80 cm RV S prime:     10.90 cm/s TAPSE (M-mode): 1.9 cm LEFT ATRIUM             Index       RIGHT ATRIUM           Index LA diam:        4.00 cm 2.02 cm/m  RA Area:     16.90 cm LA Vol (A2C):   64.6 ml 32.70 ml/m RA Volume:   46.10 ml  23.34 ml/m LA Vol (A4C):   41.0 ml 20.75 ml/m LA Biplane Vol: 51.9 ml 26.27 ml/m  AORTIC VALVE                PULMONIC VALVE AV Area (Vmax): 1.95 cm    PV Vmax:       1.13 m/s AV Vmax:  124.00 cm/s PV Peak grad:  5.1 mmHg AV Peak Grad:   6.2 mmHg LVOT Vmax:      69.80 cm/s  AORTA Ao Root diam: 2.80 cm MITRAL VALVE                TRICUSPID VALVE MV Area (PHT): 6.37 cm     TR Peak grad:   37.9 mmHg MV Decel Time: 119 msec     TR Vmax:        308.00 cm/s MV E velocity: 111.00 cm/s                             SHUNTS                             Systemic Diam: 2.10 cm Nelva Bush MD Electronically signed by Nelva Bush MD Signature Date/Time: 10/02/2020/7:17:48 AM    Final    IR NEPHROSTOMY PLACEMENT LEFT  Result Date: 10/01/2020 INDICATION: 87 year old male presenting with  presumed urinary sepsis and non ST elevated myocardial infarction, bladder mass suspected. EXAM: 1. ULTRASOUND GUIDANCE FOR PUNCTURE OF THE LEFT RENAL COLLECTING SYSTEM 2. LEFT PERCUTANEOUS NEPHROSTOMY TUBE PLACEMENT. COMPARISON:  None. MEDICATIONS: The patient is receiving appropriate inpatient antibiotic coverage prior to procedure. ANESTHESIA/SEDATION: Moderate (conscious) sedation was employed during this procedure. A total of Versed 0 mg and Fentanyl 25 mcg was administered intravenously. Moderate Sedation Time: 0 minutes. The patient's level of consciousness and vital signs were monitored continuously by radiology nursing throughout the procedure under my direct supervision. CONTRAST:  Twenty mL Isovue 300 - administered into the renal collecting system FLUOROSCOPY TIME:  0.8 minutes, 123456 mGy COMPLICATIONS: None immediate. PROCEDURE: The procedure, risks, benefits, and alternatives were explained to the patient. Questions regarding the procedure were encouraged and answered. The patient understands and consents to the procedure. A timeout was performed prior to the initiation of the procedure. The left flank region was prepped and draped in the usual sterile fashion and a sterile drape was applied covering the operative field. A sterile gown and sterile gloves were used for the procedure. Local anesthesia was provided with 1% Lidocaine. Ultrasound was used to localize the left kidney. Under direct ultrasound guidance, a 20 gauge needle was advanced into the renal collecting system. An ultrasound image documentation was performed. Access within the collecting system was confirmed with the efflux of urine followed by limited contrast injection. Over a Nitrex wire, the tract was dilated with an Accustick stent. Next, under intermittent fluoroscpic guidance and over a short Amplatz wire, the track was dilated ultimately allowing placement of a 10.2 percutaneous nephrostomy catheter which was advanced to the level  of the renal pelvis where the coil was formed and locked. Contrast was injected and several spot fluoroscopic images were obtained in various obliquities. The catheter was secured at the skin with a 0 silk retention suture and stat lock device and connected to a gravity bag was placed. Dressings were applied. The patient tolerated procedure well without immediate postprocedural complication. FINDINGS: Ultrasound scanning demonstrates a moderately dilated left collecting system. Under a combination of ultrasound and fluoroscopic guidance, a posterior inferior calix was targeted allowing placement of a 10.2 percutaneous nephrostomy catheter with end coiled and locked within the renal pelvis. Contrast injection confirmed appropriate positioning. Contrast does not travel beyond the mid ureter. IMPRESSION: Successful ultrasound and fluoroscopic guided placement of a left sided 10.2 [place] PCN.  PLAN: Interventional Radiology will arrange for routine check and exchange of indwelling nephrostomy in 4-6 weeks. Ruthann Cancer, MD [facility] Electronically Signed   By: Ruthann Cancer MD   On: 10/01/2020 16:21    Cardiac Studies   Echo 10/01/20 1. Left ventricular ejection fraction, by estimation, is 25 to 30%. The  left ventricle has severely decreased function. The left ventricle  demonstrates global hypokinesis. There is mild left ventricular  hypertrophy. Left ventricular diastolic parameters  are indeterminate.  2. Right ventricular systolic function is low normal. The right  ventricular size is normal. There is moderately elevated pulmonary artery  systolic pressure.  3. Suspect ASD present with left-to-right shunting, incompletely imaged.  Evidence of atrial level shunting detected by color flow Doppler.  4. The mitral valve is abnormal. Mild mitral valve regurgitation. No  evidence of mitral stenosis.  5. The aortic valve is tricuspid. There  is mild calcification of the  aortic valve. There is mild thickening of the aortic valve. Aortic valve  regurgitation is not visualized. Mild to moderate aortic valve  sclerosis/calcification is present, without any  evidence of aortic stenosis.  6. Mildly dilated pulmonary artery.  7. The inferior vena cava is dilated in size with <50% respiratory  variability, suggesting right atrial pressure of 15 mmHg.   Patient Profile     87 y.o. male with pmh of HTN, DM2, stroke, dementia, CKD who is being evaluated for elevated troponin and new cardiomyopathy.   Assessment & Plan    NSTEMI HS troponin - Given metal status unble to assess for anginal symptoms - EKG shows new LBBB - Echo showed LVEF 25-30% with global hypokinesis - Patient is not a candidate for invasive procedures given multiple comorbidity and mental status - plan for IV heparin for 48 hours, however with blood in the urine and dropping Hgb, I will hold IV heparin. Md to see - stop ASA for anemia - continue Coreg 3.'125mg'$  BID and statin.  Acute HFrEF/pulmonary HTN - Most recent echo showed decreased EF 35-30% and elevated right heart pressures with RVSP 52.9 mmHg, suspected ASD - Plan for medical management as above - Diuretics held for CKD - continue coreg - No Ace/ARB/Entresto/spiro due to renal function  CKD/ESRD - creatinine elevated to 4.03 on and potassium 4.8 - creatinine in late 2020 was 3.5 - creatinine mildly improved today, 4.03>3.89 - recommend nephrology consult  Anemia - monitor H&H on IV heparin.  - Hgb 8.7>7.4. Hold heparin as above  Severe sepsis with shock 2/2 pyelonephritis - s/p Left nephrostomy - abx per IM - s/p IVF - Urology following  History of CVA - no acute changes on head CT  Goals of care - consider palliative consult   For questions or updates, please contact [facility] Please consult [email or web address] for contact info under        Signed, Nabeeha Badertscher Ninfa Meeker, PA-C   10/03/2020, 8:14 AM

## 2020-10-04 ENCOUNTER — Inpatient Hospital Stay: Payer: Medicare Other

## 2020-10-04 DIAGNOSIS — F0390 Unspecified dementia without behavioral disturbance: Secondary | ICD-10-CM

## 2020-10-04 DIAGNOSIS — E1121 Type 2 diabetes mellitus with diabetic nephropathy: Secondary | ICD-10-CM

## 2020-10-04 DIAGNOSIS — N10 Acute tubulo-interstitial nephritis: Secondary | ICD-10-CM | POA: Diagnosis not present

## 2020-10-04 LAB — URINE CULTURE: Culture: >=100000 — AB

## 2020-10-04 LAB — COMPREHENSIVE METABOLIC PANEL
ALT: 5 U/L (ref 0–44)
AST: 11 U/L — ABNORMAL LOW (ref 15–41)
Albumin: 2 g/dL — ABNORMAL LOW (ref 3.5–5.0)
Alkaline Phosphatase: 48 U/L (ref 38–126)
Anion gap: 9 (ref 5–15)
BUN: 74 mg/dL — ABNORMAL HIGH (ref 8–23)
CO2: 24 mmol/L (ref 22–32)
Calcium: 7.5 mg/dL — ABNORMAL LOW (ref 8.9–10.3)
Chloride: 108 mmol/L (ref 98–111)
Creatinine, Ser: 3.69 mg/dL — ABNORMAL HIGH (ref 0.61–1.24)
GFR, Estimated: 15 mL/min — ABNORMAL LOW (ref >60)
Glucose, Bld: 188 mg/dL — ABNORMAL HIGH (ref 70–99)
Potassium: 4.2 mmol/L (ref 3.5–5.1)
Sodium: 141 mmol/L (ref 135–145)
Total Bilirubin: 0.5 mg/dL (ref 0.3–1.2)
Total Protein: 5.5 g/dL — ABNORMAL LOW (ref 6.5–8.1)

## 2020-10-04 LAB — CBC
HCT: 24.2 % — ABNORMAL LOW (ref 39.0–52.0)
Hemoglobin: 8 g/dL — ABNORMAL LOW (ref 13.0–17.0)
MCH: 30.2 pg (ref 26.0–34.0)
MCHC: 33.1 g/dL (ref 30.0–36.0)
MCV: 91.3 fL (ref 80.0–100.0)
Platelets: 189 10*3/uL (ref 150–400)
RBC: 2.65 MIL/uL — ABNORMAL LOW (ref 4.22–5.81)
RDW: 13.7 % (ref 11.5–15.5)
WBC: 7.3 10*3/uL (ref 4.0–10.5)
nRBC: 0 % (ref 0.0–0.2)

## 2020-10-04 LAB — GLUCOSE, CAPILLARY
Glucose-Capillary: 157 mg/dL — ABNORMAL HIGH (ref 70–99)
Glucose-Capillary: 193 mg/dL — ABNORMAL HIGH (ref 70–99)
Glucose-Capillary: 138 mg/dL — ABNORMAL HIGH (ref 70–99)
Glucose-Capillary: 150 mg/dL — ABNORMAL HIGH (ref 70–99)

## 2020-10-04 MED ORDER — CEFAZOLIN SODIUM-DEXTROSE 2-4 GM/100ML-% IV SOLN
2.0000 g | Freq: Two times a day (BID) | INTRAVENOUS | Status: DC
  Administered 2020-10-05: 2 g via INTRAVENOUS
  Filled 2020-10-04 (×2): qty 100

## 2020-10-04 NOTE — Progress Notes (Signed)
[place], [place] 10/04/20  Subjective:   LOS: 3  Ian Jimenez 87 year old Caucasian male with a past medical history of hyperlipidemia, hypertension, stroke/TIA, dementia, CKD stage III, and depression.  Patient presents to the emergency room from his skilled nursing facility with altered mental status and hematuria.  Patient has intermittent moments of alertness and orientation.  History was gathered from his daughter who states he was more confused today.  In the ED, patient found to have large amount of blood in urine.  Unable to assess for UTI symptoms.  CT scan was ordered showed left-sided hydronephrosis without obstructing stone possible bladder mass.  Patient admitted for severe sepsis with septic shock due to acute pyelonephritis.  Patient also being followed by urology and IR placed a left nephrostomy tube.  We have been consulted to monitor kidney function within the current  setting of septic shock.  Baseline creatinine is 1.41 back in April 2020.  Current creatinine is 3.89.    Patient seen resting quietly in bed Daughter at bedside Daughter feels he is doing better She feed him some oatmeal for breakfast  UOP-445 ml  Objective:  Vital signs in last 24 hours:  Temp:  [97.5 F (36.4 C)-98.6 F (37 C)] 98.6 F (37 C) (03/03 1257) Pulse Rate:  [63-70] 64 (03/03 1257) Resp:  [16-18] 18 (03/03 1257) BP: (125-138)/(57-67) 127/57 (03/03 1257) SpO2:  [94 %-97 %] 96 % (03/03 1257) Weight:  [70.2 kg] 70.2 kg (03/03 0500)  Weight change: 0.165 kg Filed Weights   10/02/20 0001 10/03/20 0444 10/04/20 0500  Weight: 74.8 kg 70 kg 70.2 kg    Intake/Output:    Intake/Output Summary (Last 24 hours) at 10/04/2020 1344 Last data filed at 10/04/2020 0900 Gross per 24 hour  Intake 1788.75 ml  Output 900 ml  Net 888.75 ml     Physical Exam: General: No acute distress  HEENT Moist oral muosa, anteric  Pulm/lungs Clear, normal breathing pattern   CVS/Heart  regular rate, no rubs or gallops  Abdomen:   Soft, left upper quadrant tenderness  Extremities:  Minimal peripheral edema  Neurologic:  Alert, disoriented  Skin:  No rashes or masses          Basic Metabolic Panel:  Recent Labs  Lab 10/01/20 1124 10/02/20 1034 10/03/20 0626 10/04/20 0604  NA 140 138 142 141  K 4.2 4.8 4.6 4.2  CL 104 104 107 108  CO2 23 21* 24 24  GLUCOSE 184* 172* 132* 188*  BUN 48* 58* 69* 74*  CREATININE 3.98* 4.03* 3.89* 3.69*  CALCIUM 8.2* 7.6* 7.6* 7.5*  MG  --   --  2.4  --      CBC: Recent Labs  Lab 10/01/20 1124 10/02/20 0725 10/02/20 1124 10/03/20 0626 10/03/20 1405 10/04/20 0604  WBC 19.5* 10.9* 9.8 7.7  --  7.3  NEUTROABS 18.0*  --  8.5* 6.5  --   --   HGB 9.7* 8.0* 8.7* 7.4* 7.9* 8.0*  HCT 29.0* 25.0* 26.7* 23.1*  --  24.2*  MCV 91.2 91.6 92.4 92.8  --  91.3  PLT 237 196 176 172  --  189     No results found for: HEPBSAG, HEPBSAB, HEPBIGM    Microbiology:  Recent Results (from the past 240 hour(s))  Urine Culture     Status: Abnormal   Collection Time: 10/01/20 11:24 AM   Specimen: Urine, Random  Result Value Ref Range Status   Specimen Description   Final  URINE, RANDOM Performed at [facility], [address] [place], [place] [address]    Special Requests   Final    NONE Performed at [facility], [address] [place], [place] [address]    Culture >=100,000 COLONIES/mL KLEBSIELLA PNEUMONIAE (A)  Final   Report Status 10/03/2020 FINAL  Final   Organism ID, Bacteria KLEBSIELLA PNEUMONIAE (A)  Final      Susceptibility   Klebsiella pneumoniae - MIC*    AMPICILLIN RESISTANT Resistant     CEFAZOLIN <=4 SENSITIVE Sensitive     CEFEPIME <=0.12 SENSITIVE Sensitive     CEFTRIAXONE <=0.25 SENSITIVE Sensitive     CIPROFLOXACIN <=0.25 SENSITIVE Sensitive     GENTAMICIN <=1 SENSITIVE Sensitive     IMIPENEM <=0.25 SENSITIVE Sensitive     NITROFURANTOIN 32 SENSITIVE Sensitive      TRIMETH/SULFA <=20 SENSITIVE Sensitive     AMPICILLIN/SULBACTAM 4 SENSITIVE Sensitive     PIP/TAZO <=4 SENSITIVE Sensitive     * >=100,000 COLONIES/mL KLEBSIELLA PNEUMONIAE  Blood culture (routine x 2)     Status: Abnormal (Preliminary result)   Collection Time: 10/01/20 11:24 AM   Specimen: BLOOD  Result Value Ref Range Status   Specimen Description   Final    BLOOD RIGHT ANTECUBITAL Performed at [facility], [address] [place], [place] [address]    Special Requests   Final    BOTTLES DRAWN AEROBIC AND ANAEROBIC Blood Culture adequate volume Performed at [facility], [address] [place], [place] [address]    Culture  Setup Time   Final    GRAM NEGATIVE RODS IN BOTH AEROBIC AND ANAEROBIC BOTTLES CRITICAL RESULT CALLED TO, READ BACK BY AND VERIFIED WITH: JASON ROBBINS '@0255'$  10/02/20 RH Performed at [facility], [place] [address], [place], [place] [address]    Culture KLEBSIELLA PNEUMONIAE (A)  Final   Report Status PENDING  Incomplete  Blood Culture ID Panel (Reflexed)     Status: Abnormal   Collection Time: 10/01/20 11:24 AM  Result Value Ref Range Status   Enterococcus faecalis NOT DETECTED NOT DETECTED Final   Enterococcus Faecium NOT DETECTED NOT DETECTED Final   Listeria monocytogenes NOT DETECTED NOT DETECTED Final   Staphylococcus species NOT DETECTED NOT DETECTED Final   Staphylococcus aureus (BCID) NOT DETECTED NOT DETECTED Final   Staphylococcus epidermidis NOT DETECTED NOT DETECTED Final   Staphylococcus lugdunensis NOT DETECTED NOT DETECTED Final   Streptococcus species NOT DETECTED NOT DETECTED Final   Streptococcus agalactiae NOT DETECTED NOT DETECTED Final   Streptococcus pneumoniae NOT DETECTED NOT DETECTED Final   Streptococcus pyogenes NOT DETECTED NOT DETECTED Final   A.calcoaceticus-baumannii NOT DETECTED NOT DETECTED Final   Bacteroides fragilis NOT DETECTED NOT DETECTED Final   Enterobacterales DETECTED (A) NOT DETECTED  Final    Comment: Enterobacterales represent a large order of gram negative bacteria, not a single organism. CRITICAL RESULT CALLED TO, READ BACK BY AND VERIFIED WITH: JASON ROBBINS '@0255'$  10/02/20 RH    Enterobacter cloacae complex NOT DETECTED NOT DETECTED Final   Escherichia coli NOT DETECTED NOT DETECTED Final   Klebsiella aerogenes NOT DETECTED NOT DETECTED Final   Klebsiella oxytoca NOT DETECTED NOT DETECTED Final   Klebsiella pneumoniae DETECTED (A) NOT DETECTED Final    Comment: CRITICAL RESULT CALLED TO, READ BACK BY AND VERIFIED WITH: JASON ROBBINS '@0255'$  10/02/20 RH    Proteus species NOT DETECTED NOT DETECTED Final   Salmonella species NOT DETECTED NOT DETECTED Final   Serratia  marcescens NOT DETECTED NOT DETECTED Final   Haemophilus influenzae NOT DETECTED NOT DETECTED Final   Neisseria meningitidis NOT DETECTED NOT DETECTED Final   Pseudomonas aeruginosa NOT DETECTED NOT DETECTED Final   Stenotrophomonas maltophilia NOT DETECTED NOT DETECTED Final   Candida albicans NOT DETECTED NOT DETECTED Final   Candida auris NOT DETECTED NOT DETECTED Final   Candida glabrata NOT DETECTED NOT DETECTED Final   Candida krusei NOT DETECTED NOT DETECTED Final   Candida parapsilosis NOT DETECTED NOT DETECTED Final   Candida tropicalis NOT DETECTED NOT DETECTED Final   Cryptococcus neoformans/gattii NOT DETECTED NOT DETECTED Final   CTX-M ESBL NOT DETECTED NOT DETECTED Final   Carbapenem resistance IMP NOT DETECTED NOT DETECTED Final   Carbapenem resistance KPC NOT DETECTED NOT DETECTED Final   Carbapenem resistance NDM NOT DETECTED NOT DETECTED Final   Carbapenem resist OXA 48 LIKE NOT DETECTED NOT DETECTED Final   Carbapenem resistance VIM NOT DETECTED NOT DETECTED Final    Comment: Performed at [facility], [address] [place], [place] [address]  SARS CORONAVIRUS 2 (TAT 6-24 HRS) Nasopharyngeal Nasopharyngeal Swab     Status: None   Collection Time: 10/01/20 12:36 PM    Specimen: Nasopharyngeal Swab  Result Value Ref Range Status   SARS Coronavirus 2 NEGATIVE NEGATIVE Final    Comment: (NOTE) SARS-CoV-2 target nucleic acids are NOT DETECTED.  The SARS-CoV-2 RNA is generally detectable in upper and lower respiratory specimens during the acute phase of infection. Negative results do not preclude SARS-CoV-2 infection, do not rule out co-infections with other pathogens, and should not be used as the sole basis for treatment or other patient management decisions. Negative results must be combined with clinical observations, patient history, and epidemiological information. The expected result is Negative.  Fact Sheet for Patients: SugarRoll.be  Fact Sheet for Healthcare Providers: https://www.woods-mathews.com/  This test is not yet approved or cleared by the [place] FDA and  has been authorized for detection and/or diagnosis of SARS-CoV-2 by FDA under an Emergency Use Authorization (EUA). This EUA will remain  in effect (meaning this test can be used) for the duration of the COVID-19 declaration under Se ction 564(b)(1) of the Act, 21 U.S.C. section 360bbb-3(b)(1), unless the authorization is terminated or revoked sooner.  Performed at [facility], [place] [address] [place], [place] [address]   Blood culture (routine x 2)     Status: None (Preliminary result)   Collection Time: 10/01/20 12:46 PM   Specimen: BLOOD  Result Value Ref Range Status   Specimen Description BLOOD BOTTOM LEFT HAND  Final   Special Requests   Final    BOTTLES DRAWN AEROBIC AND ANAEROBIC Blood Culture adequate volume   Culture   Final    NO GROWTH 3 DAYS Performed at [facility], [address], [place], [place] [address]    Report Status PENDING  Incomplete  Urine culture     Status: Abnormal   Collection Time: 10/01/20  4:08 PM   Specimen: Urine, Catheterized  Result Value Ref Range Status   Specimen  Description URINE, CATHETERIZED  Final   Special Requests   Final    SYRINGE Performed at [facility], [place] [address] [place], [place] [address]    Culture >=100,000 COLONIES/mL KLEBSIELLA PNEUMONIAE (A)  Final   Report Status 10/04/2020 FINAL  Final   Organism ID, Bacteria KLEBSIELLA PNEUMONIAE (A)  Final      Susceptibility   Klebsiella pneumoniae - MIC*  AMPICILLIN >=32 RESISTANT Resistant     CEFAZOLIN <=4 SENSITIVE Sensitive     CEFEPIME <=0.12 SENSITIVE Sensitive     CEFTRIAXONE <=0.25 SENSITIVE Sensitive     CIPROFLOXACIN <=0.25 SENSITIVE Sensitive     GENTAMICIN <=1 SENSITIVE Sensitive     IMIPENEM <=0.25 SENSITIVE Sensitive     NITROFURANTOIN 64 INTERMEDIATE Intermediate     TRIMETH/SULFA <=20 SENSITIVE Sensitive     AMPICILLIN/SULBACTAM 8 SENSITIVE Sensitive     PIP/TAZO <=4 SENSITIVE Sensitive     * >=100,000 COLONIES/mL KLEBSIELLA PNEUMONIAE    Coagulation Studies: No results for input(s): LABPROT, INR in the last 72 hours.  Urinalysis: No results for input(s): COLORURINE, LABSPEC, PHURINE, GLUCOSEU, HGBUR, BILIRUBINUR, KETONESUR, PROTEINUR, UROBILINOGEN, NITRITE, LEUKOCYTESUR in the last 72 hours.  Invalid input(s): APPERANCEUR    Imaging: No results found.   Medications:   . [START ON 10/05/2020]  ceFAZolin (ANCEF) IV    . dextrose 5 % and 0.45% NaCl 75 mL/hr at 10/04/20 1120   . atorvastatin  40 mg Oral Daily  . carbidopa-levodopa  1 tablet Oral TID  . carvedilol  3.125 mg Oral BID WC  . divalproex  250 mg Oral BID  . donepezil  10 mg Oral QHS  . escitalopram  5 mg Oral Daily  . insulin aspart  0-5 Units Subcutaneous QHS  . insulin aspart  0-9 Units Subcutaneous TID WC  . iodixanol  50 mL Other Once  . sodium chloride flush  5 mL Intracatheter Q8H   albuterol, dextromethorphan-guaiFENesin, loperamide, ondansetron (ZOFRAN) IV, oxyCODONE-acetaminophen  Assessment/ Plan:  87 y.o. male with  was admitted on 10/01/2020 for  Principal  Problem:   Acute pyelonephritis Active Problems:   TIA (transient ischemic attack)   Hypertension associated with diabetes ([place])   Depression due to dementia [facility])   NSTEMI (non-ST elevated myocardial infarction) ([place])   Type II diabetes mellitus with renal manifestations ([place])   Acute renal failure superimposed on stage 3a chronic kidney disease ([place])   HLD (hyperlipidemia)   Dementia ([place])   Hydronephrosis of left kidney   Acute respiratory failure with hypoxia ([facility])   Severe sepsis with septic shock ([place])   Septicemia due to Klebsiella pneumoniae ([facility])   HFrEF (heart failure with reduced ejection fraction) ([place])  NSTEMI (non-ST elevated myocardial infarction) ([place]) [I21.4] Bladder mass [N32.89] Sepsis with acute renal failure and tubular necrosis without septic shock, due to unspecified organism ([place]) [A41.9, R65.20, N17.0]  #. Acute Kidney Injury on Chronic kidney disease Stage 4 -likely due to acute pyelonephritis with Klebsiella pneumoniae -Baseline creatinine is 1.41 back in April 2020.  -Current creatinine is 3.69 -Nephrostomy tube draining clear yellow fluid -Confirmed with daughter that we will provide conservative management. Daughter says they would not pursue dialysis.  -Will monitor labs  #. Acute Pyelonephritis secondary to obstruction  -Left nephrostomy tube placed to decompress kidney -Antibiotics -IVF D51/2 NS '@75'$  ml/hr   #. Diabetes type 2 with CKD Hgb A1c MFr Bld (%)  Date Value  10/02/2020 6.6 (H)  Stable glucose  Thank you for the consult   LOS: [place] 3/3/20221:44 PM  [place], [place]

## 2020-10-04 NOTE — Progress Notes (Signed)
Progress Note  Patient Name: Ian Jimenez Date of Encounter: 10/04/2020  [facility] Cardiologist: [facility]  Subjective  Minimally conversant Reports not hungry, he does not need anything Gross hematuria and Foley catheter Appears pale Laying supine in bed, otherwise no distress  Inpatient Medications    Scheduled Meds: . atorvastatin  40 mg Oral Daily  . carbidopa-levodopa  1 tablet Oral TID  . carvedilol  3.125 mg Oral BID WC  . divalproex  250 mg Oral BID  . donepezil  10 mg Oral QHS  . escitalopram  5 mg Oral Daily  . insulin aspart  0-5 Units Subcutaneous QHS  . insulin aspart  0-9 Units Subcutaneous TID WC  . iodixanol  50 mL Other Once  . sodium chloride flush  5 mL Intracatheter Q8H   Continuous Infusions: . [START ON 10/05/2020]  ceFAZolin (ANCEF) IV    . dextrose 5 % and 0.45% NaCl 75 mL/hr at 10/04/20 1120   PRN Meds: albuterol, dextromethorphan-guaiFENesin, loperamide, ondansetron (ZOFRAN) IV, oxyCODONE-acetaminophen   Vital Signs    Vitals:   10/04/20 0541 10/04/20 0754 10/04/20 1257 10/04/20 1525  BP: 131/61 138/64 (!) 127/57 129/78  Pulse: 70 67 64 68  Resp: '16 17 18 18  '$ Temp: 97.8 F (36.6 C) 97.8 F (36.6 C) 98.6 F (37 C) 98.2 F (36.8 C)  TempSrc: Oral Oral Oral Oral  SpO2: 97% 96% 96% 100%  Weight:      Height:        Intake/Output Summary (Last 24 hours) at 10/04/2020 1616 Last data filed at 10/04/2020 1427 Gross per 24 hour  Intake 1788.75 ml  Output 1000 ml  Net 788.75 ml   Last 3 Weights 10/04/2020 10/03/2020 10/02/2020  Weight (lbs) 154 lb 12.2 oz 154 lb 6.4 oz 164 lb 14.4 oz  Weight (kg) 70.2 kg 70.035 kg 74.798 kg      Telemetry    - Personally Reviewed  ECG     - Personally Reviewed  Physical Exam   GEN: No acute distress.  Appears pale Neck:  Unable to estimate JVD Cardiac: RRR, no murmurs, rubs, or gallops.  Respiratory: Clear to auscultation bilaterally. GI: Soft, nontender, non-distended  MS: No edema; No  deformity. Neuro:  Unable to participate Psych: Minimally conversant  Labs    High Sensitivity Troponin:   Recent Labs  Lab 10/01/20 1124 10/01/20 1246 10/01/20 1525 10/01/20 2040 10/02/20 0725  TROPONINIHS 206* 235* 400* 1,511* 2,102*      Chemistry Recent Labs  Lab 10/01/20 1124 10/02/20 1034 10/03/20 0626 10/04/20 0604  NA 140 138 142 141  K 4.2 4.8 4.6 4.2  CL 104 104 107 108  CO2 23 21* 24 24  GLUCOSE 184* 172* 132* 188*  BUN 48* 58* 69* 74*  CREATININE 3.98* 4.03* 3.89* 3.69*  CALCIUM 8.2* 7.6* 7.6* 7.5*  PROT 6.5  --   --  5.5*  ALBUMIN 2.7*  --   --  2.0*  AST 19  --   --  11*  ALT 7  --   --  5  ALKPHOS 54  --   --  48  BILITOT 0.7  --   --  0.5  GFRNONAA 14* 14* 15* 15*  ANIONGAP '13 13 11 9     '$ Hematology Recent Labs  Lab 10/02/20 1124 10/03/20 0626 10/03/20 1405 10/04/20 0604  WBC 9.8 7.7  --  7.3  RBC 2.89* 2.49*  --  2.65*  HGB 8.7* 7.4* 7.9* 8.0*  HCT  26.7* 23.1*  --  24.2*  MCV 92.4 92.8  --  91.3  MCH 30.1 29.7  --  30.2  MCHC 32.6 32.0  --  33.1  RDW 14.1 13.7  --  13.7  PLT 176 172  --  189    BNP Recent Labs  Lab 10/01/20 1124  BNP 2,334.2*     DDimer No results for input(s): DDIMER in the last 168 hours.   Radiology    US RENAL  Result Date: 10/04/2020 CLINICAL DATA:  Acute kidney injury. EXAM: RENAL / URINARY TRACT ULTRASOUND COMPLETE COMPARISON:  July 12, 2018.  October 01, 2020. FINDINGS: Right Kidney: Renal measurements: 8.8 x 5.3 x 5.0 cm = volume: 121 mL. Increased echogenicity of renal parenchyma is noted suggesting medical renal disease. No mass or hydronephrosis visualized. Left Kidney: Renal measurements: 10.8 x 5.7 x 5.5 cm = volume: 176 mL. Increased echogenicity of renal parenchyma is noted suggesting medical renal disease. No mass or hydronephrosis visualized. Bladder: 7.2 x 6.3 x 4.9 cm multilobulated abnormality is seen in the inferior portion of the urinary bladder concerning for neoplasm or malignancy.  Other: None. IMPRESSION: Increased echogenicity of renal parenchyma is noted suggesting medical renal disease. 7.2 x 6.3 x 4.9 cm multilobulated abnormality is seen in the inferior portion of the urinary bladder concerning for neoplasm or malignancy. Cystoscopy is recommended for further evaluation. Electronically Signed   By: Marijo Conception M.D.   On: 10/04/2020 13:47    Cardiac Studies   Echocardiogram Performed October 01, 2020 EF 25 to 30% Global hypokinesis, moderately elevated right heart pressures ASD suspected left to right shunting Dilated IVC  Patient Profile     87 year old gentleman with history of dementia, CVA, diabetes, hypertension presenting with altered mental status, diagnosed with elevated troponins, urinary tract obstruction, anemia.  Seen for NSTEMI.   Assessment & Plan    Non-STEMI Troponin peak 2000 All anticoagulation held given gross hematuria No further cardiac work-up at this time, not a candidate ACE ARB held secondary to renal failure  Cardiomyopathy Ejection fraction 25 to 30% On carvedilol, all other medications held Appears euvolemic  Hydronephrosis, obstructing mass, status post left nephrostomy tube, Klebsiella UTI, gross hematuria Followed by urology  History of stroke, TIA, dementia Flat affect Minimally communicative  Goals of care Palliative meeting with patient, family Multiorgan failure including kidney, cardiac, neurologic, urologic, hematologic Very high risk sudden death from MI, cardiac arrhythmia Consider hospice   Total encounter time more than 25 minutes  Greater than 50% was spent in counseling and coordination of care with the patient    For questions or updates, please contact [facility] Please consult [email or web address] for contact info under        Signed, Ida Rogue, MD  10/04/2020, 4:16 PM

## 2020-10-04 NOTE — Plan of Care (Signed)
PMT note:  Patient is resting in bed and cannot speak with me. Spoke with daughter Caren Griffins. She is aware of his status and would like to have a family meeting tomorrow as her mother is legally his decision maker though she is the contact. Plans for family meeting tomorrow at 1:00.

## 2020-10-04 NOTE — Progress Notes (Signed)
Urology Inpatient Progress Note  Subjective: No acute events overnight. Creatinine remains elevated today, 3.69.  WBC count stable, 7.3.  Hemoglobin up today, 8.0.  Urine culture finalized with ampicillin resistant and Macrobid intermediate Klebsiella pneumonia, on antibiotics as below.  PSA has resulted 0.23 and urine cytology is pending. Left nephrostomy tube in place draining clear, yellow urine.  Condom cath in place draining dark maroon urine without clots.  No evidence of active bleeding. Patient is somnolent today and unable to contribute to HPI.  Patient's daughter is at the bedside and reports that he wakes only to painful stimuli and continues to appear uncomfortable with palpation of the lower abdomen.  She states that she does not wish for her father to be in pain and wants him to be as comfortable as possible.  She is curious about speaking with the palliative care team regarding scope of care, stating that she is aware that her father has multiple medical problems at this point and she is unsure but she wishes to proceed with a more invasive tests and/or procedures.  Anti-infectives: Anti-infectives (From admission, onward)   Start     Dose/Rate Route Frequency Ordered Stop   10/02/20 0400  cefTRIAXone (ROCEPHIN) 2 g in sodium chloride 0.9 % 100 mL IVPB        2 g 200 mL/hr over 30 Minutes Intravenous Every 24 hours 10/02/20 0320     10/01/20 1215  cefTRIAXone (ROCEPHIN) 1 g in sodium chloride 0.9 % 100 mL IVPB  Status:  Discontinued        1 g 200 mL/hr over 30 Minutes Intravenous Every 24 hours 10/01/20 1206 10/02/20 0319      Current Facility-Administered Medications  Medication Dose Route Frequency Provider Last Rate Last Admin  . albuterol (PROVENTIL) (2.5 MG/3ML) 0.083% nebulizer solution 2.5 mg  2.5 mg Nebulization Q4H PRN Ivor Costa, MD      . atorvastatin (LIPITOR) tablet 40 mg  40 mg Oral Daily Ivor Costa, MD   40 mg at 10/04/20 [id number]  . carbidopa-levodopa (SINEMET IR)  25-100 MG per tablet immediate release 1 tablet  1 tablet Oral TID Ivor Costa, MD   1 tablet at 10/04/20 0813  . carvedilol (COREG) tablet 3.125 mg  3.125 mg Oral BID WC Visser, Jacquelyn D, PA-C   3.125 mg at 10/04/20 [id number]  . cefTRIAXone (ROCEPHIN) 2 g in sodium chloride 0.9 % 100 mL IVPB  2 g Intravenous Q24H Sharion Settler, NP 200 mL/hr at 10/04/20 0512 2 g at 10/04/20 0512  . dextromethorphan-guaiFENesin (MUCINEX DM) 30-600 MG per 12 hr tablet 1 tablet  1 tablet Oral BID PRN Ivor Costa, MD      . dextrose 5 %-0.45 % sodium chloride infusion   Intravenous Continuous Gwynne Edinger, MD 75 mL/hr at 10/03/20 2233 New Bag at 10/03/20 2233  . divalproex (DEPAKOTE) DR tablet 250 mg  250 mg Oral BID Ivor Costa, MD   250 mg at 10/04/20 [id number]  . donepezil (ARICEPT) tablet 10 mg  10 mg Oral QHS Ivor Costa, MD   10 mg at 10/03/20 2225  . escitalopram (LEXAPRO) tablet 5 mg  5 mg Oral Daily Ivor Costa, MD   5 mg at 10/04/20 [id number]  . insulin aspart (novoLOG) injection 0-5 Units  0-5 Units Subcutaneous QHS Ivor Costa, MD      . insulin aspart (novoLOG) injection 0-9 Units  0-9 Units Subcutaneous TID WC Ivor Costa, MD   2 Units at 10/04/20 [id number]  . iodixanol (  VISIPAQUE) 320 MG/ML injection 50 mL  50 mL Other Once Suttle, Dylan J, MD      . loperamide (IMODIUM) capsule 2 mg  2 mg Oral PRN Ivor Costa, MD      . ondansetron [facility]) injection 4 mg  4 mg Intravenous Q6H PRN Sharion Settler, NP   4 mg at 10/01/20 2058  . oxyCODONE-acetaminophen (PERCOCET/ROXICET) 5-325 MG per tablet 1 tablet  1 tablet Oral Q4H PRN Ivor Costa, MD      . sodium chloride flush (NS) 0.9 % injection 5 mL  5 mL Intracatheter Q8H Suttle, Dylan J, MD   5 mL at 10/04/20 0511   Objective: Vital signs in last 24 hours: Temp:  [97.5 F (36.4 C)-97.9 F (36.6 C)] 97.8 F (36.6 C) (03/03 0754) Pulse Rate:  [60-70] 67 (03/03 0754) Resp:  [16-20] 17 (03/03 0754) BP: (125-138)/(59-67) 138/64 (03/03 0754) SpO2:  [94 %-100 %] 96 % (03/03  0754) Weight:  [70.2 kg] 70.2 kg (03/03 0500)  Intake/Output from previous day: 03/02 0701 - 03/03 0700 In: 1716.7 [P.O.:240; I.V.:1476.7] Out: 1020 [Urine:445; Drains:575] Intake/Output this shift: No intake/output data recorded.  Physical Exam Vitals and nursing note reviewed.  Constitutional:      General: He is sleeping.     Appearance: He is not toxic-appearing or diaphoretic.  HENT:     Head: Normocephalic and atraumatic.  Pulmonary:     Effort: Pulmonary effort is normal. No respiratory distress.  Skin:    General: Skin is warm and dry.    Lab Results:  Recent Labs    10/03/20 0626 10/03/20 1405 10/04/20 0604  WBC 7.7  --  7.3  HGB 7.4* 7.9* 8.0*  HCT 23.1*  --  24.2*  PLT 172  --  189   BMET Recent Labs    10/03/20 0626 10/04/20 0604  NA 142 141  K 4.6 4.2  CL 107 108  CO2 24 24  GLUCOSE 132* 188*  BUN 69* 74*  CREATININE 3.89* 3.69*  CALCIUM 7.6* 7.5*   PT/INR Recent Labs    10/01/20 1124  LABPROT 15.2  INR 1.2   Assessment & Plan: 87 year old comorbid male with dementia admitted for management of sepsis of likely urinary source with left-sided hydronephrosis possibly secondary to an obstructing mass, now s/p left nephrostomy tube placement and on culture appropriate antibiotics for Klebsiella UTI.  He is voiding via condom cath with significant gross hematuria without clots and creatinine is downtrending but remains elevated over baseline.  Recommend renal ultrasound today including bladder imaging to ensure he is emptying his bladder well and there does not appear to be any clots.  I suspect his left hydronephrosis will have improved or resolved given well draining left nephrostomy tube.  Continue culture appropriate antibiotics for Klebsiella UTI.  We continue to await urine cytology, however may defer further evaluation of possible bladder mass per family's wishes.  Recommend palliative care consult to define scope of care.  Debroah Loop, PA-C 10/04/2020

## 2020-10-04 NOTE — Progress Notes (Signed)
PROGRESS NOTE    Rockne Saldutti  [id number] DOB: 11-24-1933 DOA: 10/01/2020 PCP: Albina Billet, MD   Chief complaint.  Altered mental status. Brief Narrative:  Ian Jimenez is a 87 y.o. male with medical history significant of HTN, HLD, stroke/TIA, dementia, CKD-III, depression, presents with AMS, hematuria. CT abdomen/pelvis showed a possible blood clots in the bladder, with the possibility of bladder mass.  Patient also has hydronephrosis.  Emergent nephrostomy was performed by IR.  Urine cultures so far growing gram-negative rods, blood culture positive for Klebsiella pneumonia.  Patient has been treated with Rocephin. Patient also had elevated troponin, peaked at 2102.  Has been seen by cardiology, not a good candidate for heart cath due to multiple medical problems including renal failure, and bacteremia.   Assessment & Plan:   Principal Problem:   Acute pyelonephritis Active Problems:   TIA (transient ischemic attack)   Hypertension associated with diabetes ([place])   Depression due to dementia [facility])   NSTEMI (non-ST elevated myocardial infarction) ([place])   Type II diabetes mellitus with renal manifestations ([place])   Acute renal failure superimposed on stage 3a chronic kidney disease ([place])   HLD (hyperlipidemia)   Dementia (H[facility])   Hydronephrosis of left kidney   Acute respiratory failure with hypoxia (H[facility])   Severe sepsis with septic shock ([place])   Septicemia due to Klebsiella pneumoniae (HCC)   HFrEF (heart failure with reduced ejection fraction) ([place])  # Severe sepsis with septic shock secondary to pyelonephritis. # Klebsiella pneumoniae bactermia # Acute pyelonephritis secondary to obstruction secondary to Klebsiella pneumonia. # Left hydronephrosis likely secondary to bladder mass. Patient is status post left nephrostomy. Patient had received IV fluid bolus, due to acute renal failure, will continue fluids for now. Urology consult obtained for possible bladder mass.  Sensitivities have returned - s/p ceftriaxone, narrow to cefazolin (abx 2/28> - f/u urology recs - renal u/s today - palliative care consult today - ID consult if does not transition to comfort care  # Non-STEMI. Acute systolic congestive heart failure with ejection fraction 25 to 30%. Moderate pulmonary hypertension. Acute hypoxemic respiratory failure. Patient has significant elevation of troponin, followed by cardiology. He is not a good candidate for heart cath due to medical conditions. Patient had echocardiogram from 2019, ejection fraction was normal. I am not certain about the patient volume status, she has elevated BNP, but this is associated with worsening renal function.  Chest x-ray does not seem to have increased pulmonary edema.  He is currently on IV fluids for acute kidney injury. H stable today at 8. Continuous blood in foley - cardiology following, holding heparin for now  #.  Acute kidney injury on chronic kidney disease stage IIIa versus stage IV. Due to patient previous record, new early 2020, creatinine was 1.5, later the year creatinine increased to 3.5, this might reflect an obstruction from bladder mass. Patient is status post left nephrostomy, will monitor renal function until patient renal function reaches baseline. No hyperkalemia or acidosis. Kidney function slowly improving - nephrology consulted, no indication for dialysis and daughter has declined - continue gentle fluids  #   Type 2 diabetes. Well controlled.  # .  Essential hypertension. Continue to monitor blood pressure. Currently wnl  #  Parkinson disease with dementia. Acute metabolic encephalopathy Patient still very confused not able to communicate.  CT head showed atrophy with old infarct, no acute changes. Continue home medicines.    DVT prophylaxis: IV heparin Code Status: Partial (pressors but no cpr  or intubation) Family Communication: daughter updated @ bedside Disposition Plan:   .   Status is: Inpatient  Remains inpatient appropriate because:Inpatient level of care appropriate due to severity of illness   Dispo: The patient is from: Home              Anticipated d/c is to: Home              Patient currently is not medically stable to d/c.   Difficult to place patient No        I/O last 3 completed shifts: In: 2257.7 [P.O.:300; I.V.:1737.1; IV Piggyback:220.6] Out: 1320 [Urine:545; Drains:775] Total I/O In: 312.1 [I.V.:212.1; IV Piggyback:100] Out: -      Consultants:   Cardiology, nephrology, urologist  Procedures: Left nephrostomy.  Antimicrobials:  Rocephin  Subjective: Pt not communicative, does not appear in pain.  Objective: Vitals:   10/03/20 2103 10/04/20 0500 10/04/20 0541 10/04/20 0754  BP: 125/64  131/61 138/64  Pulse: 63  70 67  Resp: '16  16 17  '$ Temp: (!) 97.5 F (36.4 C)  97.8 F (36.6 C) 97.8 F (36.6 C)  TempSrc: Oral  Oral Oral  SpO2: 94%  97% 96%  Weight:  70.2 kg    Height:        Intake/Output Summary (Last 24 hours) at 10/04/2020 1126 Last data filed at 10/04/2020 0900 Gross per 24 hour  Intake 1788.75 ml  Output 900 ml  Net 888.75 ml   Filed Weights   10/02/20 0001 10/03/20 0444 10/04/20 0500  Weight: 74.8 kg 70 kg 70.2 kg    Examination:  General exam: Appears calm and comfortable, ill appearing  Respiratory system: Clear to auscultation. Respiratory effort normal. Cardiovascular system: S1 & S2 heard, RRR. No JVD, murmurs, rubs, gallops or clicks. No pedal edema. Gastrointestinal system: Abdomen is mildly distended, soft and nontender. No organomegaly or masses felt. Normal bowel sounds heard. Central nervous system: Drowsy, minimally responsive Extremities: Symmetric  Skin: No rashes, lesions or ulcers. Pale      Data Reviewed: I have personally reviewed following labs and imaging studies  CBC: Recent Labs  Lab 10/01/20 1124 10/02/20 0725 10/02/20 1124 10/03/20 0626 10/03/20 1405  10/04/20 0604  WBC 19.5* 10.9* 9.8 7.7  --  7.3  NEUTROABS 18.0*  --  8.5* 6.5  --   --   HGB 9.7* 8.0* 8.7* 7.4* 7.9* 8.0*  HCT 29.0* 25.0* 26.7* 23.1*  --  24.2*  MCV 91.2 91.6 92.4 92.8  --  91.3  PLT 237 196 176 172  --  99991111   Basic Metabolic Panel: Recent Labs  Lab 10/01/20 1124 10/02/20 1034 10/03/20 0626 10/04/20 0604  NA 140 138 142 141  K 4.2 4.8 4.6 4.2  CL 104 104 107 108  CO2 23 21* 24 24  GLUCOSE 184* 172* 132* 188*  BUN 48* 58* 69* 74*  CREATININE 3.98* 4.03* 3.89* 3.69*  CALCIUM 8.2* 7.6* 7.6* 7.5*  MG  --   --  2.4  --    GFR: Estimated Creatinine Clearance: 14.8 mL/min (A) (by C-G formula based on SCr of 3.69 mg/dL (H)). Liver Function Tests: Recent Labs  Lab 10/01/20 1124 10/04/20 0604  AST 19 11*  ALT 7 5  ALKPHOS 54 48  BILITOT 0.7 0.5  PROT 6.5 5.5*  ALBUMIN 2.7* 2.0*   No results for input(s): LIPASE, AMYLASE in the last 168 hours. No results for input(s): AMMONIA in the last 168 hours. Coagulation Profile: Recent Labs  Lab 10/01/20 1124  INR 1.2   Cardiac Enzymes: No results for input(s): CKTOTAL, CKMB, CKMBINDEX, TROPONINI in the last 168 hours. BNP (last 3 results) No results for input(s): PROBNP in the last 8760 hours. HbA1C: Recent Labs    10/02/20 0725  HGBA1C 6.6*   CBG: Recent Labs  Lab 10/03/20 0717 10/03/20 1130 10/03/20 1520 10/03/20 2213 10/04/20 0753  GLUCAP 133* 192* 177* 140* 157*   Lipid Profile: Recent Labs    10/02/20 0725  CHOL 112  HDL 36*  LDLCALC 58  TRIG 89  CHOLHDL 3.1   Thyroid Function Tests: No results for input(s): TSH, T4TOTAL, FREET4, T3FREE, THYROIDAB in the last 72 hours. Anemia Panel: Recent Labs    10/01/20 1525 10/02/20 0725  VITAMINB12 290  --   TIBC  --  168*  IRON  --  6*   Sepsis Labs: Recent Labs  Lab 10/01/20 1105 10/01/20 1525 10/01/20 2333 10/02/20 0145  PROCALCITON  --  71.94  --   --   LATICACIDVEN 5.1* 3.7* 2.9* 1.9    Recent Results (from the past  240 hour(s))  Urine Culture     Status: Abnormal   Collection Time: 10/01/20 11:24 AM   Specimen: Urine, Random  Result Value Ref Range Status   Specimen Description   Final    URINE, RANDOM Performed at [facility], [address] [place], [place] [address]    Special Requests   Final    NONE Performed at [facility], [address] [place], [place] [address]    Culture >=100,000 COLONIES/mL KLEBSIELLA PNEUMONIAE (A)  Final   Report Status 10/03/2020 FINAL  Final   Organism ID, Bacteria KLEBSIELLA PNEUMONIAE (A)  Final      Susceptibility   Klebsiella pneumoniae - MIC*    AMPICILLIN RESISTANT Resistant     CEFAZOLIN <=4 SENSITIVE Sensitive     CEFEPIME <=0.12 SENSITIVE Sensitive     CEFTRIAXONE <=0.25 SENSITIVE Sensitive     CIPROFLOXACIN <=0.25 SENSITIVE Sensitive     GENTAMICIN <=1 SENSITIVE Sensitive     IMIPENEM <=0.25 SENSITIVE Sensitive     NITROFURANTOIN 32 SENSITIVE Sensitive     TRIMETH/SULFA <=20 SENSITIVE Sensitive     AMPICILLIN/SULBACTAM 4 SENSITIVE Sensitive     PIP/TAZO <=4 SENSITIVE Sensitive     * >=100,000 COLONIES/mL KLEBSIELLA PNEUMONIAE  Blood culture (routine x 2)     Status: Abnormal (Preliminary result)   Collection Time: 10/01/20 11:24 AM   Specimen: BLOOD  Result Value Ref Range Status   Specimen Description   Final    BLOOD RIGHT ANTECUBITAL Performed at [facility], [address], [place], [place] [address]    Special Requests   Final    BOTTLES DRAWN AEROBIC AND ANAEROBIC Blood Culture adequate volume Performed at [facility], [address] [place], [place] [address]    Culture  Setup Time   Final    GRAM NEGATIVE RODS IN BOTH AEROBIC AND ANAEROBIC BOTTLES CRITICAL RESULT CALLED TO, READ BACK BY AND VERIFIED WITH: JASON ROBBINS '@0255'$  10/02/20 RH Performed at [facility], [place] [address], [place], [place] [address]    Culture KLEBSIELLA PNEUMONIAE (A)  Final   Report Status PENDING   Incomplete  Blood Culture ID Panel (Reflexed)     Status: Abnormal   Collection Time: 10/01/20 11:24 AM  Result Value Ref Range Status   Enterococcus faecalis NOT DETECTED NOT DETECTED Final   Enterococcus Faecium NOT DETECTED NOT DETECTED Final  Listeria monocytogenes NOT DETECTED NOT DETECTED Final   Staphylococcus species NOT DETECTED NOT DETECTED Final   Staphylococcus aureus (BCID) NOT DETECTED NOT DETECTED Final   Staphylococcus epidermidis NOT DETECTED NOT DETECTED Final   Staphylococcus lugdunensis NOT DETECTED NOT DETECTED Final   Streptococcus species NOT DETECTED NOT DETECTED Final   Streptococcus agalactiae NOT DETECTED NOT DETECTED Final   Streptococcus pneumoniae NOT DETECTED NOT DETECTED Final   Streptococcus pyogenes NOT DETECTED NOT DETECTED Final   A.calcoaceticus-baumannii NOT DETECTED NOT DETECTED Final   Bacteroides fragilis NOT DETECTED NOT DETECTED Final   Enterobacterales DETECTED (A) NOT DETECTED Final    Comment: Enterobacterales represent a large order of gram negative bacteria, not a single organism. CRITICAL RESULT CALLED TO, READ BACK BY AND VERIFIED WITH: JASON ROBBINS '@0255'$  10/02/20 RH    Enterobacter cloacae complex NOT DETECTED NOT DETECTED Final   Escherichia coli NOT DETECTED NOT DETECTED Final   Klebsiella aerogenes NOT DETECTED NOT DETECTED Final   Klebsiella oxytoca NOT DETECTED NOT DETECTED Final   Klebsiella pneumoniae DETECTED (A) NOT DETECTED Final    Comment: CRITICAL RESULT CALLED TO, READ BACK BY AND VERIFIED WITH: JASON ROBBINS '@0255'$  10/02/20 RH    Proteus species NOT DETECTED NOT DETECTED Final   Salmonella species NOT DETECTED NOT DETECTED Final   Serratia marcescens NOT DETECTED NOT DETECTED Final   Haemophilus influenzae NOT DETECTED NOT DETECTED Final   Neisseria meningitidis NOT DETECTED NOT DETECTED Final   Pseudomonas aeruginosa NOT DETECTED NOT DETECTED Final   Stenotrophomonas maltophilia NOT DETECTED NOT DETECTED Final    Candida albicans NOT DETECTED NOT DETECTED Final   Candida auris NOT DETECTED NOT DETECTED Final   Candida glabrata NOT DETECTED NOT DETECTED Final   Candida krusei NOT DETECTED NOT DETECTED Final   Candida parapsilosis NOT DETECTED NOT DETECTED Final   Candida tropicalis NOT DETECTED NOT DETECTED Final   Cryptococcus neoformans/gattii NOT DETECTED NOT DETECTED Final   CTX-M ESBL NOT DETECTED NOT DETECTED Final   Carbapenem resistance IMP NOT DETECTED NOT DETECTED Final   Carbapenem resistance KPC NOT DETECTED NOT DETECTED Final   Carbapenem resistance NDM NOT DETECTED NOT DETECTED Final   Carbapenem resist OXA 48 LIKE NOT DETECTED NOT DETECTED Final   Carbapenem resistance VIM NOT DETECTED NOT DETECTED Final    Comment: Performed at [facility], [address] [place], [place] [address]  SARS CORONAVIRUS 2 (TAT 6-24 HRS) Nasopharyngeal Nasopharyngeal Swab     Status: None   Collection Time: 10/01/20 12:36 PM   Specimen: Nasopharyngeal Swab  Result Value Ref Range Status   SARS Coronavirus 2 NEGATIVE NEGATIVE Final    Comment: (NOTE) SARS-CoV-2 target nucleic acids are NOT DETECTED.  The SARS-CoV-2 RNA is generally detectable in upper and lower respiratory specimens during the acute phase of infection. Negative results do not preclude SARS-CoV-2 infection, do not rule out co-infections with other pathogens, and should not be used as the sole basis for treatment or other patient management decisions. Negative results must be combined with clinical observations, patient history, and epidemiological information. The expected result is Negative.  Fact Sheet for Patients: SugarRoll.be  Fact Sheet for Healthcare Providers: https://www.woods-mathews.com/  This test is not yet approved or cleared by the [place] FDA and  has been authorized for detection and/or diagnosis of SARS-CoV-2 by FDA under an Emergency Use Authorization  (EUA). This EUA will remain  in effect (meaning this test can be used) for the duration of the COVID-19 declaration under Se ction  564(b)(1) of the Act, 21 U.S.C. section 360bbb-3(b)(1), unless the authorization is terminated or revoked sooner.  Performed at [facility], [place] [address] [place], [place] [address]   Blood culture (routine x 2)     Status: None (Preliminary result)   Collection Time: 10/01/20 12:46 PM   Specimen: BLOOD  Result Value Ref Range Status   Specimen Description BLOOD BOTTOM LEFT HAND  Final   Special Requests   Final    BOTTLES DRAWN AEROBIC AND ANAEROBIC Blood Culture adequate volume   Culture   Final    NO GROWTH 3 DAYS Performed at [facility], [address], [place], [place] [address]    Report Status PENDING  Incomplete  Urine culture     Status: Abnormal   Collection Time: 10/01/20  4:08 PM   Specimen: Urine, Catheterized  Result Value Ref Range Status   Specimen Description URINE, CATHETERIZED  Final   Special Requests   Final    SYRINGE Performed at [facility], [place] [address], [place], [place] [address]    Culture >=100,000 COLONIES/mL KLEBSIELLA PNEUMONIAE (A)  Final   Report Status 10/04/2020 FINAL  Final   Organism ID, Bacteria KLEBSIELLA PNEUMONIAE (A)  Final      Susceptibility   Klebsiella pneumoniae - MIC*    AMPICILLIN >=32 RESISTANT Resistant     CEFAZOLIN <=4 SENSITIVE Sensitive     CEFEPIME <=0.12 SENSITIVE Sensitive     CEFTRIAXONE <=0.25 SENSITIVE Sensitive     CIPROFLOXACIN <=0.25 SENSITIVE Sensitive     GENTAMICIN <=1 SENSITIVE Sensitive     IMIPENEM <=0.25 SENSITIVE Sensitive     NITROFURANTOIN 64 INTERMEDIATE Intermediate     TRIMETH/SULFA <=20 SENSITIVE Sensitive     AMPICILLIN/SULBACTAM 8 SENSITIVE Sensitive     PIP/TAZO <=4 SENSITIVE Sensitive     * >=100,000 COLONIES/mL KLEBSIELLA PNEUMONIAE         Radiology Studies: No results found.      Scheduled Meds: . atorvastatin  40  mg Oral Daily  . carbidopa-levodopa  1 tablet Oral TID  . carvedilol  3.125 mg Oral BID WC  . divalproex  250 mg Oral BID  . donepezil  10 mg Oral QHS  . escitalopram  5 mg Oral Daily  . insulin aspart  0-5 Units Subcutaneous QHS  . insulin aspart  0-9 Units Subcutaneous TID WC  . iodixanol  50 mL Other Once  . sodium chloride flush  5 mL Intracatheter Q8H   Continuous Infusions: . cefTRIAXone (ROCEPHIN)  IV 2 g (10/04/20 0512)  . dextrose 5 % and 0.45% NaCl 75 mL/hr at 10/04/20 1120     LOS: 3 days    Time spent: 34 minutes    Desma Maxim, MD [facility]   To contact the attending provider between 7A-7P or the covering provider during after hours 7P-7A, please log into the web site [email or web address] and access using universal [place] password for that web site. If you do not have the password, please call the hospital operator.  10/04/2020, 11:26 AM

## 2020-10-05 DIAGNOSIS — Z7189 Other specified counseling: Secondary | ICD-10-CM

## 2020-10-05 LAB — COMPREHENSIVE METABOLIC PANEL
ALT: <5 U/L (ref 0–44)
AST: 12 U/L — ABNORMAL LOW (ref 15–41)
Albumin: 2.1 g/dL — ABNORMAL LOW (ref 3.5–5.0)
Alkaline Phosphatase: 49 U/L (ref 38–126)
Anion gap: 10 (ref 5–15)
BUN: 72 mg/dL — ABNORMAL HIGH (ref 8–23)
CO2: 23 mmol/L (ref 22–32)
Calcium: 7.4 mg/dL — ABNORMAL LOW (ref 8.9–10.3)
Chloride: 108 mmol/L (ref 98–111)
Creatinine, Ser: 3.48 mg/dL — ABNORMAL HIGH (ref 0.61–1.24)
GFR, Estimated: 17 mL/min — ABNORMAL LOW (ref >60)
Glucose, Bld: 196 mg/dL — ABNORMAL HIGH (ref 70–99)
Potassium: 3.9 mmol/L (ref 3.5–5.1)
Sodium: 141 mmol/L (ref 135–145)
Total Bilirubin: 0.4 mg/dL (ref 0.3–1.2)
Total Protein: 5.3 g/dL — ABNORMAL LOW (ref 6.5–8.1)

## 2020-10-05 LAB — CBC
HCT: 27.3 % — ABNORMAL LOW (ref 39.0–52.0)
Hemoglobin: 8.9 g/dL — ABNORMAL LOW (ref 13.0–17.0)
MCH: 30 pg (ref 26.0–34.0)
MCHC: 32.6 g/dL (ref 30.0–36.0)
MCV: 91.9 fL (ref 80.0–100.0)
Platelets: 196 10*3/uL (ref 150–400)
RBC: 2.97 MIL/uL — ABNORMAL LOW (ref 4.22–5.81)
RDW: 13.5 % (ref 11.5–15.5)
WBC: 7.7 10*3/uL (ref 4.0–10.5)
nRBC: 0 % (ref 0.0–0.2)

## 2020-10-05 LAB — GLUCOSE, CAPILLARY
Glucose-Capillary: 207 mg/dL — ABNORMAL HIGH (ref 70–99)
Glucose-Capillary: 178 mg/dL — ABNORMAL HIGH (ref 70–99)
Glucose-Capillary: 130 mg/dL — ABNORMAL HIGH (ref 70–99)

## 2020-10-05 LAB — CULTURE, BLOOD (ROUTINE X 2): Special Requests: ADEQUATE

## 2020-10-05 MED ORDER — BIOTENE DRY MOUTH MT LIQD: 15.0000 mL | OROMUCOSAL | Status: DC | PRN

## 2020-10-05 MED ORDER — GLYCOPYRROLATE 0.2 MG/ML IJ SOLN
0.2000 mg | INTRAMUSCULAR | Status: DC | PRN
  Filled 2020-10-05: qty 1

## 2020-10-05 MED ORDER — GLYCOPYRROLATE 1 MG PO TABS
1.0000 mg | ORAL_TABLET | ORAL | Status: DC | PRN
  Filled 2020-10-05: qty 1

## 2020-10-05 MED ORDER — ONDANSETRON HCL 4 MG/2ML IJ SOLN: 4.0000 mg | Freq: Four times a day (QID) | INTRAMUSCULAR | Status: DC | PRN

## 2020-10-05 MED ORDER — HALOPERIDOL 0.5 MG PO TABS
0.5000 mg | ORAL_TABLET | ORAL | Status: DC | PRN
  Filled 2020-10-05: qty 1

## 2020-10-05 MED ORDER — SODIUM CHLORIDE 0.9% FLUSH: 3.0000 mL | INTRAVENOUS | Status: DC | PRN

## 2020-10-05 MED ORDER — ACETAMINOPHEN 325 MG PO TABS: 650.0000 mg | ORAL_TABLET | Freq: Four times a day (QID) | ORAL | Status: DC | PRN

## 2020-10-05 MED ORDER — HALOPERIDOL LACTATE 5 MG/ML IJ SOLN: 0.5000 mg | INTRAMUSCULAR | Status: DC | PRN

## 2020-10-05 MED ORDER — HALOPERIDOL LACTATE 2 MG/ML PO CONC
0.5000 mg | ORAL | Status: DC | PRN
  Filled 2020-10-05: qty 0.3

## 2020-10-05 MED ORDER — SODIUM CHLORIDE 0.9% FLUSH
3.0000 mL | Freq: Two times a day (BID) | INTRAVENOUS | Status: DC
  Administered 2020-10-05 (×2): 3 mL via INTRAVENOUS

## 2020-10-05 MED ORDER — ONDANSETRON 4 MG PO TBDP
4.0000 mg | ORAL_TABLET | Freq: Four times a day (QID) | ORAL | Status: DC | PRN
  Filled 2020-10-05: qty 1

## 2020-10-05 MED ORDER — MORPHINE SULFATE (PF) 2 MG/ML IV SOLN
1.0000 mg | INTRAVENOUS | Status: DC | PRN
  Administered 2020-10-06 (×2): 1 mg via INTRAVENOUS
  Filled 2020-10-05 (×2): qty 1

## 2020-10-05 MED ORDER — ACETAMINOPHEN 650 MG RE SUPP: 650.0000 mg | Freq: Four times a day (QID) | RECTAL | Status: DC | PRN

## 2020-10-05 MED ORDER — POLYVINYL ALCOHOL 1.4 % OP SOLN
1.0000 [drp] | Freq: Four times a day (QID) | OPHTHALMIC | Status: DC | PRN
  Filled 2020-10-05: qty 15

## 2020-10-05 NOTE — Consult Note (Signed)
Consultation Note Date: 10/05/2020   Patient Name: Ian Jimenez  DOB: September 21, 1933  MRN: [id number]  Age / Sex: 87 y.o., male  PCP: Albina Billet, MD Referring Physician: Gwynne Edinger, MD  Reason for Consultation: Establishing goals of care  HPI/Patient Profile: Ian Jimenez a 87 y.o.malewith medical history significant ofHTN, HLD, stroke/TIA, dementia, CKD-III, depression, presents with AMS,hematuria. CT abdomen/pelvis showed a possible blood clots in the bladder, with the possibility of bladder mass.  Clinical Assessment and Goals of Care: Patient is resting in bed with daughter and wife at bedside. He opens his eyes briefly during conversation but does not speak. Wife is legally blind. Daughter is a Pharmacist, community. Patient has been married 27 years and has 2 children.  He was diagnosed with dementia in 2018, and has been living in memory care since November. She discusses decline over the past couple of months.   We discussed his diagnoses, prognosis, GOC, EOL wishes disposition and options.  Created space and opportunity for patient  to explore thoughts and feelings regarding current medical information.   A detailed discussion was had today regarding advanced directives.  Concepts specific to code status, artifical feeding and hydration, IV antibiotics and rehospitalization were discussed.  The difference between an aggressive medical intervention path and a comfort care path was discussed.  Values and goals of care important to patient and family were attempted to be elicited.  Discussed limitations of medical interventions to prolong quality of life in some situations and discussed the concept of human mortality.  All questions answered and various scenarios and concerns discussed. Wife states her husband would want to focus on comfort and dignity for what time he has left. With discussion, they agree  they would like him to proceed to the hospice facility.    I completed a MOST form today and the signed original was placed in the chart. The form was signed by daughter as wife is legally blind. A photocopy was placed in the chart to be scanned into EMR. The patient outlined their wishes for the following treatment decisions:  Cardiopulmonary Resuscitation: Do Not Attempt Resuscitation (DNR/No CPR)  Medical Interventions: Comfort Measures: Keep clean, warm, and dry. Use medication by any route, positioning, wound care, and other measures to relieve pain and suffering. Use oxygen, suction and manual treatment of airway obstruction as needed for comfort. Do not transfer to the hospital unless comfort needs cannot be met in current location.  Antibiotics: No antibiotics (use other measures to relieve symptoms)  IV Fluids: No IV fluids (provide other measures to ensure comfort)  Feeding Tube: No feeding tube    [place] facility placement.   Prognosis:  < 2 weeks  Sepsis- bacteremia. No PO intake. NSTEMI without cath, bladder mass, nephrostomy tube.        Primary Diagnoses: Present on Admission: . NSTEMI (non-ST elevated myocardial infarction) ([place]) . Type II diabetes mellitus with renal manifestations ([place]) . Depression due to dementia ([place]) . Hypertension associated with diabetes ([place]) .  TIA (transient ischemic attack) . Acute renal failure superimposed on stage 3a chronic kidney disease ([place]) . HLD (hyperlipidemia) . Dementia ([place]) . Acute pyelonephritis . Hydronephrosis of left kidney . Acute respiratory failure with hypoxia ([place]) . Severe sepsis with septic shock ([place])   I have reviewed the medical record, interviewed the patient and family, and examined the patient. The following aspects are pertinent.  Past Medical History:  Diagnosis Date  . Dementia ([place])   . Diabetes mellitus without complication ([place])   . Hypertension   . Stroke [facility])     Social History   Socioeconomic History  . Marital status: Married    Spouse name: Not on file  . Number of children: Not on file  . Years of education: Not on file  . Highest education level: Not on file  Occupational History  . Occupation: retired  Tobacco Use  . Smoking status: Never Smoker  . Smokeless tobacco: Never Used  Vaping Use  . Vaping Use: Never used  Substance and Sexual Activity  . Alcohol use: Never  . Drug use: Never  . Sexual activity: Not on file  Other Topics Concern  . Not on file  Social History Narrative  . Not on file   Social Determinants of Health   Financial Resource Strain: Not on file  Food Insecurity: Not on file  Transportation Needs: Not on file  Physical Activity: Not on file  Stress: Not on file  Social Connections: Not on file   Family History  Problem Relation Age of Onset  . Heart disease Father    Scheduled Meds: . atorvastatin  40 mg Oral Daily  . carbidopa-levodopa  1 tablet Oral TID  . carvedilol  3.125 mg Oral BID WC  . divalproex  250 mg Oral BID  . donepezil  10 mg Oral QHS  . escitalopram  5 mg Oral Daily  . insulin aspart  0-5 Units Subcutaneous QHS  . insulin aspart  0-9 Units Subcutaneous TID WC  . iodixanol  50 mL Other Once  . sodium chloride flush  5 mL Intracatheter Q8H   Continuous Infusions: .  ceFAZolin (ANCEF) IV 2 g (10/05/20 0846)  . dextrose 5 % and 0.45% NaCl 75 mL/hr at 10/05/20 0336   PRN Meds:.albuterol, dextromethorphan-guaiFENesin, loperamide, ondansetron (ZOFRAN) IV, oxyCODONE-acetaminophen Medications Prior to Admission:  Prior to Admission medications   Medication Sig Start Date End Date Taking? Authorizing Provider  amLODipine (NORVASC) 10 MG tablet Take 1 tablet (10 mg total) by mouth daily. 05/04/18  Yes Salary, Holly Bodily D, MD  carbidopa-levodopa (SINEMET IR) 25-100 MG tablet Take 1 tablet by mouth 3 (three) times daily. 09/01/20  Yes [provider]  clopidogrel (PLAVIX) 75 MG  tablet Take 75 mg by mouth daily. 09/22/20  Yes [provider]  divalproex (DEPAKOTE) 250 MG DR tablet Take 250 mg by mouth 2 (two) times daily. 09/22/20  Yes [provider]  donepezil (ARICEPT) 10 MG tablet Take 10 mg by mouth at bedtime.   Yes [provider]  escitalopram (LEXAPRO) 5 MG tablet Take 5 mg by mouth daily. 03/12/18  Yes [provider]  glipiZIDE (GLUCOTROL) 5 MG tablet Take 1 tablet (5 mg total) by mouth 2 (two) times daily before a meal. 04/11/18  Yes Gladstone Lighter, MD  hydrALAZINE (APRESOLINE) 25 MG tablet Take 25 mg by mouth 2 (two) times daily. 05/13/18  Yes [provider]  loperamide (IMODIUM) 2 MG capsule Take 2 mg by mouth as needed for  diarrhea or loose stools. 1 capsule mon, wed, fri, for excessive large bowel movments   Yes [provider]  metFORMIN (GLUCOPHAGE) 1000 MG tablet Take 500 mg by mouth at bedtime. At supper   Yes [provider]  metoprolol tartrate (LOPRESSOR) 25 MG tablet Take 1 tablet (25 mg total) by mouth 2 (two) times daily. 05/03/18  Yes Salary, Avel Peace, MD  acetaminophen (TYLENOL) 325 MG tablet Take 2 tablets (650 mg total) by mouth every 6 (six) hours as needed for mild pain (or Fever >/= 101). 05/03/18   Salary, Avel Peace, MD  NON FORMULARY Diet type: Carb Diet    [provider]  omeprazole (PRILOSEC) 40 MG capsule Take 40 mg by mouth daily. Patient not taking: No sig reported 05/07/18   [provider]  ondansetron (ZOFRAN-ODT) 4 MG disintegrating tablet Take 4 mg by mouth every 8 (eight) hours as needed. 08/03/20   [provider]   No Known Allergies Review of Systems  Unable to perform ROS   Physical Exam Constitutional:      Comments: Opened eyes briefly and closed.      Vital Signs: BP 124/61 (BP Location: Left Arm)   Pulse (!) 59   Temp 97.8 F (36.6 C)   Resp 19   Ht _0  (1.753 m)   Wt 74 kg   SpO2 96%   BMI 24.09 kg/m  Pain Scale:  PAINAD POSS *See Group Information*: 2-Acceptable,Slightly drowsy, easily aroused Pain Score: Asleep   SpO2: SpO2: 96 % O2 Device:SpO2: 96 % O2 Flow Rate: .O2 Flow Rate (L/min): 2 L/min  IO: Intake/output summary:   Intake/Output Summary (Last 24 hours) at 10/05/2020 1417 Last data filed at 10/05/2020 1220 Gross per 24 hour  Intake 1321.14 ml  Output 100 ml  Net 1221.14 ml    LBM: Last BM Date: 10/04/20 Baseline Weight: Weight: 81.6 kg Most recent weight: Weight: 74 kg      Time In: 1:00 Time Out: 2:10 Time Total: 70 min Greater than 50%  of this time was spent counseling and coordinating care related to the above assessment and plan.  Signed by: Asencion Gowda, NP   Please contact Palliative Medicine Team phone at [phone] for questions and concerns.  For individual provider: See Shea Evans

## 2020-10-05 NOTE — Progress Notes (Signed)
[place], [place] 10/05/20  Subjective:   LOS: 4  Ian Jimenez 87 year old Caucasian male with a past medical history of hyperlipidemia, hypertension, stroke/TIA, dementia, CKD stage III, and depression.  Patient presents to the emergency room from his skilled nursing facility with altered mental status and hematuria.  Patient has intermittent moments of alertness and orientation.  History was gathered from his daughter who states he was more confused today.  In the ED, patient found to have large amount of blood in urine.  Unable to assess for UTI symptoms.  CT scan was ordered showed left-sided hydronephrosis without obstructing stone possible bladder mass.  Patient admitted for severe sepsis with septic shock due to acute pyelonephritis.  Patient also being followed by urology and IR placed a left nephrostomy tube.  We have been consulted to monitor kidney function within the current  setting of septic shock.  Baseline creatinine is 1.41 back in April 2020.  Current creatinine is 3.89.    Patient seen resting quietly in bed Daughter at bedside Daughter believes he's worse today He's moaning and more confused Given pain medication before my arrival  UOP-100 ml  Objective:  Vital signs in last 24 hours:  Temp:  [97.4 F (36.3 C)-98.6 F (37 C)] 97.8 F (36.6 C) (03/04 1220) Pulse Rate:  [59-68] 59 (03/04 1220) Resp:  [16-19] 19 (03/04 1220) BP: (124-155)/(61-79) 124/61 (03/04 1220) SpO2:  [96 %-100 %] 96 % (03/04 1220) Weight:  [74 kg] 74 kg (03/04 0502)  Weight change: 3.782 kg Filed Weights   10/03/20 0444 10/04/20 0500 10/05/20 0502  Weight: 70 kg 70.2 kg 74 kg    Intake/Output:    Intake/Output Summary (Last 24 hours) at 10/05/2020 1431 Last data filed at 10/05/2020 1220 Gross per 24 hour  Intake 1321.14 ml  Output 0 ml  Net 1321.14 ml     Physical Exam: General: No acute distress  HEENT Moist oral muosa, anteric  Pulm/lungs Clear,  normal breathing pattern  CVS/Heart  regular rate, no rubs or gallops  Abdomen:   Soft, left upper quadrant tenderness  Extremities:  Minimal peripheral edema  Neurologic:  Alert, disoriented  Skin:  No rashes or masses          Basic Metabolic Panel:  Recent Labs  Lab 10/01/20 1124 10/02/20 1034 10/03/20 0626 10/04/20 0604 10/05/20 0439  NA 140 138 142 141 141  K 4.2 4.8 4.6 4.2 3.9  CL 104 104 107 108 108  CO2 23 21* '24 24 23  '$ GLUCOSE 184* 172* 132* 188* 196*  BUN 48* 58* 69* 74* 72*  CREATININE 3.98* 4.03* 3.89* 3.69* 3.48*  CALCIUM 8.2* 7.6* 7.6* 7.5* 7.4*  MG  --   --  2.4  --   --      CBC: Recent Labs  Lab 10/01/20 1124 10/02/20 0725 10/02/20 1124 10/03/20 0626 10/03/20 1405 10/04/20 0604 10/05/20 0439  WBC 19.5* 10.9* 9.8 7.7  --  7.3 7.7  NEUTROABS 18.0*  --  8.5* 6.5  --   --   --   HGB 9.7* 8.0* 8.7* 7.4* 7.9* 8.0* 8.9*  HCT 29.0* 25.0* 26.7* 23.1*  --  24.2* 27.3*  MCV 91.2 91.6 92.4 92.8  --  91.3 91.9  PLT 237 196 176 172  --  189 196     No results found for: HEPBSAG, HEPBSAB, HEPBIGM    Microbiology:  Recent Results (from the past 240 hour(s))  Urine Culture     Status:  Abnormal   Collection Time: 10/01/20 11:24 AM   Specimen: Urine, Random  Result Value Ref Range Status   Specimen Description   Final    URINE, RANDOM Performed at [facility], [address] [place], [place] [address]    Special Requests   Final    NONE Performed at [facility], [address] [place], [place] [address]    Culture >=100,000 COLONIES/mL KLEBSIELLA PNEUMONIAE (A)  Final   Report Status 10/03/2020 FINAL  Final   Organism ID, Bacteria KLEBSIELLA PNEUMONIAE (A)  Final      Susceptibility   Klebsiella pneumoniae - MIC*    AMPICILLIN RESISTANT Resistant     CEFAZOLIN <=4 SENSITIVE Sensitive     CEFEPIME <=0.12 SENSITIVE Sensitive     CEFTRIAXONE <=0.25 SENSITIVE Sensitive     CIPROFLOXACIN <=0.25 SENSITIVE Sensitive      GENTAMICIN <=1 SENSITIVE Sensitive     IMIPENEM <=0.25 SENSITIVE Sensitive     NITROFURANTOIN 32 SENSITIVE Sensitive     TRIMETH/SULFA <=20 SENSITIVE Sensitive     AMPICILLIN/SULBACTAM 4 SENSITIVE Sensitive     PIP/TAZO <=4 SENSITIVE Sensitive     * >=100,000 COLONIES/mL KLEBSIELLA PNEUMONIAE  Blood culture (routine x 2)     Status: Abnormal   Collection Time: 10/01/20 11:24 AM   Specimen: BLOOD  Result Value Ref Range Status   Specimen Description   Final    BLOOD RIGHT ANTECUBITAL Performed at [facility], [address], [place], [place] [address]    Special Requests   Final    BOTTLES DRAWN AEROBIC AND ANAEROBIC Blood Culture adequate volume Performed at [facility], [address] [place], [place] [address]    Culture  Setup Time   Final    GRAM NEGATIVE RODS IN BOTH AEROBIC AND ANAEROBIC BOTTLES CRITICAL RESULT CALLED TO, READ BACK BY AND VERIFIED WITH: JASON ROBBINS '@0255'$  10/02/20 RH Performed at [facility], [place] [address], [place], [place] [address]    Culture KLEBSIELLA PNEUMONIAE (A)  Final   Report Status 10/05/2020 FINAL  Final   Organism ID, Bacteria KLEBSIELLA PNEUMONIAE  Final      Susceptibility   Klebsiella pneumoniae - MIC*    AMPICILLIN >=32 RESISTANT Resistant     CEFAZOLIN <=4 SENSITIVE Sensitive     CEFEPIME <=0.12 SENSITIVE Sensitive     CEFTAZIDIME <=1 SENSITIVE Sensitive     CEFTRIAXONE <=0.25 SENSITIVE Sensitive     CIPROFLOXACIN <=0.25 SENSITIVE Sensitive     GENTAMICIN <=1 SENSITIVE Sensitive     IMIPENEM <=0.25 SENSITIVE Sensitive     TRIMETH/SULFA <=20 SENSITIVE Sensitive     AMPICILLIN/SULBACTAM 4 SENSITIVE Sensitive     PIP/TAZO <=4 SENSITIVE Sensitive     * KLEBSIELLA PNEUMONIAE  Blood Culture ID Panel (Reflexed)     Status: Abnormal   Collection Time: 10/01/20 11:24 AM  Result Value Ref Range Status   Enterococcus faecalis NOT DETECTED NOT DETECTED Final   Enterococcus Faecium NOT DETECTED NOT DETECTED Final    Listeria monocytogenes NOT DETECTED NOT DETECTED Final   Staphylococcus species NOT DETECTED NOT DETECTED Final   Staphylococcus aureus (BCID) NOT DETECTED NOT DETECTED Final   Staphylococcus epidermidis NOT DETECTED NOT DETECTED Final   Staphylococcus lugdunensis NOT DETECTED NOT DETECTED Final   Streptococcus species NOT DETECTED NOT DETECTED Final   Streptococcus agalactiae NOT DETECTED NOT DETECTED Final   Streptococcus pneumoniae NOT DETECTED NOT DETECTED Final   Streptococcus pyogenes NOT DETECTED NOT DETECTED Final  A.calcoaceticus-baumannii NOT DETECTED NOT DETECTED Final   Bacteroides fragilis NOT DETECTED NOT DETECTED Final   Enterobacterales DETECTED (A) NOT DETECTED Final    Comment: Enterobacterales represent a large order of gram negative bacteria, not a single organism. CRITICAL RESULT CALLED TO, READ BACK BY AND VERIFIED WITH: JASON ROBBINS '@0255'$  10/02/20 RH    Enterobacter cloacae complex NOT DETECTED NOT DETECTED Final   Escherichia coli NOT DETECTED NOT DETECTED Final   Klebsiella aerogenes NOT DETECTED NOT DETECTED Final   Klebsiella oxytoca NOT DETECTED NOT DETECTED Final   Klebsiella pneumoniae DETECTED (A) NOT DETECTED Final    Comment: CRITICAL RESULT CALLED TO, READ BACK BY AND VERIFIED WITH: JASON ROBBINS '@0255'$  10/02/20 RH    Proteus species NOT DETECTED NOT DETECTED Final   Salmonella species NOT DETECTED NOT DETECTED Final   Serratia marcescens NOT DETECTED NOT DETECTED Final   Haemophilus influenzae NOT DETECTED NOT DETECTED Final   Neisseria meningitidis NOT DETECTED NOT DETECTED Final   Pseudomonas aeruginosa NOT DETECTED NOT DETECTED Final   Stenotrophomonas maltophilia NOT DETECTED NOT DETECTED Final   Candida albicans NOT DETECTED NOT DETECTED Final   Candida auris NOT DETECTED NOT DETECTED Final   Candida glabrata NOT DETECTED NOT DETECTED Final   Candida krusei NOT DETECTED NOT DETECTED Final   Candida parapsilosis NOT DETECTED NOT DETECTED Final    Candida tropicalis NOT DETECTED NOT DETECTED Final   Cryptococcus neoformans/gattii NOT DETECTED NOT DETECTED Final   CTX-M ESBL NOT DETECTED NOT DETECTED Final   Carbapenem resistance IMP NOT DETECTED NOT DETECTED Final   Carbapenem resistance KPC NOT DETECTED NOT DETECTED Final   Carbapenem resistance NDM NOT DETECTED NOT DETECTED Final   Carbapenem resist OXA 48 LIKE NOT DETECTED NOT DETECTED Final   Carbapenem resistance VIM NOT DETECTED NOT DETECTED Final    Comment: Performed at [facility], [address] [place], [place] [address]  SARS CORONAVIRUS 2 (TAT 6-24 HRS) Nasopharyngeal Nasopharyngeal Swab     Status: None   Collection Time: 10/01/20 12:36 PM   Specimen: Nasopharyngeal Swab  Result Value Ref Range Status   SARS Coronavirus 2 NEGATIVE NEGATIVE Final    Comment: (NOTE) SARS-CoV-2 target nucleic acids are NOT DETECTED.  The SARS-CoV-2 RNA is generally detectable in upper and lower respiratory specimens during the acute phase of infection. Negative results do not preclude SARS-CoV-2 infection, do not rule out co-infections with other pathogens, and should not be used as the sole basis for treatment or other patient management decisions. Negative results must be combined with clinical observations, patient history, and epidemiological information. The expected result is Negative.  Fact Sheet for Patients: SugarRoll.be  Fact Sheet for Healthcare Providers: https://www.woods-mathews.com/  This test is not yet approved or cleared by the [place] FDA and  has been authorized for detection and/or diagnosis of SARS-CoV-2 by FDA under an Emergency Use Authorization (EUA). This EUA will remain  in effect (meaning this test can be used) for the duration of the COVID-19 declaration under Se ction 564(b)(1) of the Act, 21 U.S.C. section 360bbb-3(b)(1), unless the authorization is terminated or revoked  sooner.  Performed at [facility], [place] [address], [place], [place] [address]   Blood culture (routine x 2)     Status: None (Preliminary result)   Collection Time: 10/01/20 12:46 PM   Specimen: BLOOD  Result Value Ref Range Status   Specimen Description BLOOD BOTTOM LEFT HAND  Final   Special Requests   Final  BOTTLES DRAWN AEROBIC AND ANAEROBIC Blood Culture adequate volume   Culture   Final    NO GROWTH 4 DAYS Performed at [facility], [address] [place], [place] [address]    Report Status PENDING  Incomplete  Urine culture     Status: Abnormal   Collection Time: 10/01/20  4:08 PM   Specimen: Urine, Catheterized  Result Value Ref Range Status   Specimen Description URINE, CATHETERIZED  Final   Special Requests   Final    SYRINGE Performed at [facility], [address] [place], [place] [address]    Culture >=100,000 COLONIES/mL KLEBSIELLA PNEUMONIAE (A)  Final   Report Status 10/04/2020 FINAL  Final   Organism ID, Bacteria KLEBSIELLA PNEUMONIAE (A)  Final      Susceptibility   Klebsiella pneumoniae - MIC*    AMPICILLIN >=32 RESISTANT Resistant     CEFAZOLIN <=4 SENSITIVE Sensitive     CEFEPIME <=0.12 SENSITIVE Sensitive     CEFTRIAXONE <=0.25 SENSITIVE Sensitive     CIPROFLOXACIN <=0.25 SENSITIVE Sensitive     GENTAMICIN <=1 SENSITIVE Sensitive     IMIPENEM <=0.25 SENSITIVE Sensitive     NITROFURANTOIN 64 INTERMEDIATE Intermediate     TRIMETH/SULFA <=20 SENSITIVE Sensitive     AMPICILLIN/SULBACTAM 8 SENSITIVE Sensitive     PIP/TAZO <=4 SENSITIVE Sensitive     * >=100,000 COLONIES/mL KLEBSIELLA PNEUMONIAE    Coagulation Studies: No results for input(s): LABPROT, INR in the last 72 hours.  Urinalysis: No results for input(s): COLORURINE, LABSPEC, PHURINE, GLUCOSEU, HGBUR, BILIRUBINUR, KETONESUR, PROTEINUR, UROBILINOGEN, NITRITE, LEUKOCYTESUR in the last 72 hours.  Invalid input(s): APPERANCEUR    Imaging: US RENAL  Result  Date: 10/04/2020 CLINICAL DATA:  Acute kidney injury. EXAM: RENAL / URINARY TRACT ULTRASOUND COMPLETE COMPARISON:  July 12, 2018.  October 01, 2020. FINDINGS: Right Kidney: Renal measurements: 8.8 x 5.3 x 5.0 cm = volume: 121 mL. Increased echogenicity of renal parenchyma is noted suggesting medical renal disease. No mass or hydronephrosis visualized. Left Kidney: Renal measurements: 10.8 x 5.7 x 5.5 cm = volume: 176 mL. Increased echogenicity of renal parenchyma is noted suggesting medical renal disease. No mass or hydronephrosis visualized. Bladder: 7.2 x 6.3 x 4.9 cm multilobulated abnormality is seen in the inferior portion of the urinary bladder concerning for neoplasm or malignancy. Other: None. IMPRESSION: Increased echogenicity of renal parenchyma is noted suggesting medical renal disease. 7.2 x 6.3 x 4.9 cm multilobulated abnormality is seen in the inferior portion of the urinary bladder concerning for neoplasm or malignancy. Cystoscopy is recommended for further evaluation. Electronically Signed   By: Marijo Conception M.D.   On: 10/04/2020 13:47     Medications:   .  ceFAZolin (ANCEF) IV 2 g (10/05/20 0846)  . dextrose 5 % and 0.45% NaCl 75 mL/hr at 10/05/20 0336   . atorvastatin  40 mg Oral Daily  . carbidopa-levodopa  1 tablet Oral TID  . carvedilol  3.125 mg Oral BID WC  . divalproex  250 mg Oral BID  . donepezil  10 mg Oral QHS  . escitalopram  5 mg Oral Daily  . insulin aspart  0-5 Units Subcutaneous QHS  . insulin aspart  0-9 Units Subcutaneous TID WC  . iodixanol  50 mL Other Once  . sodium chloride flush  5 mL Intracatheter Q8H   albuterol, dextromethorphan-guaiFENesin, loperamide, ondansetron (ZOFRAN) IV, oxyCODONE-acetaminophen  Assessment/ Plan:  87 y.o. male with  was admitted on 10/01/2020 for  Principal Problem:  Acute pyelonephritis Active Problems:   TIA (transient ischemic attack)   Hypertension associated with diabetes ([place])   Depression due to dementia  [facility])   NSTEMI (non-ST elevated myocardial infarction) ([place])   Type II diabetes mellitus with renal manifestations ([place])   Acute renal failure superimposed on stage 3a chronic kidney disease ([facility])   HLD (hyperlipidemia)   Dementia ([facility])   Hydronephrosis of left kidney   Acute respiratory failure with hypoxia ([facility])   Severe sepsis with septic shock ([place])   Septicemia due to Klebsiella pneumoniae ([facility])   HFrEF (heart failure with reduced ejection fraction) ([place])  NSTEMI (non-ST elevated myocardial infarction) ([place]) [I21.4] Bladder mass [N32.89] Sepsis with acute renal failure and tubular necrosis without septic shock, due to unspecified organism ([place]) [A41.9, R65.20, N17.0]  #. Acute Kidney Injury on Chronic kidney disease Stage 4 -likely due to acute pyelonephritis with Klebsiella pneumoniae -Baseline creatinine is 1.41 back in April 2020.  -Current creatinine is 3.5 -Nephrostomy tube draining clear yellow fluid -will check with nursing about charting output -Foley continues to drain dark, red  -Daughter states they would not pursue dialysis, conservative measure only -Will contnue to monitor labs  #. Acute Pyelonephritis secondary to obstruction  -Left nephrostomy tube placed to decompress kidney -Antibiotics continued -D51/2 NS '@75'$  ml/hr   #. Diabetes type 2 with CKD Hgb A1c MFr Bld (%)  Date Value  10/02/2020 6.6 (H)  Stable glucose at this time  Thank you for the consult   LOS: [place] 3/4/20222:31 PM  [place], [place]

## 2020-10-05 NOTE — Progress Notes (Signed)
Referring Physician(s): Hollice Espy  Supervising Physician: Sandi Mariscal  Patient Status:  [facility] - In-pt  Chief Complaint:  F/U PCN placement  Brief History:  Ian Jimenez is a 87 y.o. male with a medical issues including DM, HTN, dementia and a stroke, on anticoagulation.   He presented to the [facility] ED 10/01/20 from his SNF for altered mental status and was found to have left hydronephosis with associated urosepsis.   Labs on admission = WBC of 19.5, lactic acid 5.1, BUN/Cr 48/3.98.   He underwent placement of a left PCN by Dr. Serafina Royals on 10/01/20.  Subjective:  Lying bed, asleep. Wife and daughter at bedside. No concerns. Daughter reports they are considering hospice/palliative care. Discussion planned today for 1 pm.  Allergies: Patient has no known allergies.  Medications: Prior to Admission medications   Medication Sig Start Date End Date Taking? Authorizing Provider  amLODipine (NORVASC) 10 MG tablet Take 1 tablet (10 mg total) by mouth daily. 05/04/18  Yes Salary, Holly Bodily D, MD  carbidopa-levodopa (SINEMET IR) 25-100 MG tablet Take 1 tablet by mouth 3 (three) times daily. 09/01/20  Yes [provider]  clopidogrel (PLAVIX) 75 MG tablet Take 75 mg by mouth daily. 09/22/20  Yes [provider]  divalproex (DEPAKOTE) 250 MG DR tablet Take 250 mg by mouth 2 (two) times daily. 09/22/20  Yes [provider]  donepezil (ARICEPT) 10 MG tablet Take 10 mg by mouth at bedtime.   Yes [provider]  escitalopram (LEXAPRO) 5 MG tablet Take 5 mg by mouth daily. 03/12/18  Yes [provider]  glipiZIDE (GLUCOTROL) 5 MG tablet Take 1 tablet (5 mg total) by mouth 2 (two) times daily before a meal. 04/11/18  Yes Gladstone Lighter, MD  hydrALAZINE (APRESOLINE) 25 MG tablet Take 25 mg by mouth 2 (two) times daily. 05/13/18  Yes [provider]  loperamide (IMODIUM) 2 MG capsule Take 2 mg by mouth as needed for diarrhea or loose stools. 1  capsule mon, wed, fri, for excessive large bowel movments   Yes [provider]  metFORMIN (GLUCOPHAGE) 1000 MG tablet Take 500 mg by mouth at bedtime. At supper   Yes [provider]  metoprolol tartrate (LOPRESSOR) 25 MG tablet Take 1 tablet (25 mg total) by mouth 2 (two) times daily. 05/03/18  Yes Salary, Avel Peace, MD  acetaminophen (TYLENOL) 325 MG tablet Take 2 tablets (650 mg total) by mouth every 6 (six) hours as needed for mild pain (or Fever >/= 101). 05/03/18   Salary, Avel Peace, MD  NON FORMULARY Diet type: Carb Diet    [provider]  omeprazole (PRILOSEC) 40 MG capsule Take 40 mg by mouth daily. Patient not taking: No sig reported 05/07/18   [provider]  ondansetron (ZOFRAN-ODT) 4 MG disintegrating tablet Take 4 mg by mouth every 8 (eight) hours as needed. 08/03/20   [provider]     Vital Signs: BP 124/61 (BP Location: Left Arm)   Pulse (!) 59   Temp 97.8 F (36.6 C)   Resp 19   Ht '5\' 9"'$  (1.753 m)   Wt 74 kg   SpO2 96%   BMI 24.09 kg/m   Physical Exam Vitals reviewed.  Constitutional:      Comments: Sleepy, family at bedside.   Cardiovascular:     Rate and Rhythm: Normal rate.  Pulmonary:     Effort: Pulmonary effort is normal.  Genitourinary:    Comments: Left PCN in place, clear  yellow urine.     Imaging: CT ABDOMEN PELVIS WO CONTRAST  Result Date: 10/01/2020 CLINICAL DATA:  Abdominal pain and hematuria EXAM: CT ABDOMEN AND PELVIS WITHOUT CONTRAST TECHNIQUE: Multidetector CT imaging of the abdomen and pelvis was performed following the standard protocol without IV contrast. COMPARISON:  Ultrasound from 07/12/2018 FINDINGS: Lower chest: Small left pleural effusion is noted with mild bibasilar atelectasis. Hepatobiliary: No focal liver abnormality is seen. No gallstones, gallbladder wall thickening, or biliary dilatation. Pancreas: Hypodensity in the region of the head of the pancreas which corresponds to a cystic  lesion seen on the prior ultrasound examination. It measures approximately 15 mm. The remainder of the pancreas is within normal limits. Spleen: Normal in size without focal abnormality. Adrenals/Urinary Tract: Left adrenal gland is within normal limits. Right adrenal gland demonstrates a small 15 mm lesion best seen on image number 29 of series 2. Statistically this likely represents an adenoma. Right kidney is well visualized without obstructive change or renal calculi. Left kidney demonstrates evidence of small nonobstructing lower pole renal stone as well as hydronephrosis and hydroureter which extends to the level of the urinary bladder. No obstructing stone is noted. The bladder is decompressed and irregular hyperdense material is noted within the bladder likely representing thrombus given the clinical history. Underlying mass lesion at the bladder trigone may be present given the left-sided hydronephrosis. Stomach/Bowel: No obstructive or inflammatory changes of the colon are seen. The appendix is within normal limits. No small bowel or gastric abnormality is seen. Vascular/Lymphatic: Aortic atherosclerosis. No enlarged abdominal or pelvic lymph nodes. Reproductive: Prostate is unremarkable. Other: No abdominal wall hernia or abnormality. No abdominopelvic ascites. Musculoskeletal: Degenerative changes of lumbar spine are noted. IMPRESSION: Left-sided hydronephrosis is noted without obstructing renal stone. This raises suspicion for possible bladder mass. Additionally hyperdense material is noted within the bladder consistent with thrombus. Direct visualization is recommended for further evaluation. Nonobstructing lower pole left renal stone. Stable hypodensity within the head of the pancreas unchanged from 2019. Small right renal lesion likely representing an adenoma but incompletely evaluated on this exam. No other focal abnormality is noted. Electronically Signed   By: Inez Catalina M.D.   On: 10/01/2020  13:25   CT Head Wo Contrast  Result Date: 10/01/2020 CLINICAL DATA:  Mental status changes. EXAM: CT HEAD WITHOUT CONTRAST TECHNIQUE: Contiguous axial images were obtained from the base of the skull through the vertex without intravenous contrast. COMPARISON:  06/14/2020 FINDINGS: Brain: There is no evidence for acute hemorrhage, hydrocephalus, mass lesion, or abnormal extra-axial fluid collection. No definite CT evidence for acute infarction. Diffuse loss of parenchymal volume is consistent with atrophy. Patchy low attenuation in the deep hemispheric and periventricular white matter is nonspecific, but likely reflects chronic microvascular ischemic demyelination. Nonacute left parietooccipital infarct is new since prior. Vascular: No hyperdense vessel or unexpected calcification. Skull: No evidence for fracture. No worrisome lytic or sclerotic lesion. Sinuses/Orbits: The visualized paranasal sinuses and mastoid air cells are clear. Visualized portions of the globes and intraorbital fat are unremarkable. Other: None. IMPRESSION: 1. No acute intracranial abnormality. 2. Nonacute left parietooccipital infarct, new since prior. 3. Atrophy with chronic small vessel white matter ischemic disease. Electronically Signed   By: Misty Stanley M.D.   On: 10/01/2020 13:25   [place] Intraoperative  Result Date: 10/01/2020 CLINICAL DATA:  Ultrasound was provided for use by the ordering physician.  No provider Interpretation or professional fees incurred.    US RENAL  Result Date: 10/04/2020 CLINICAL DATA:  Acute kidney injury. EXAM: RENAL / URINARY TRACT ULTRASOUND COMPLETE COMPARISON:  July 12, 2018.  October 01, 2020. FINDINGS: Right Kidney: Renal measurements: 8.8 x 5.3 x 5.0 cm = volume: 121 mL. Increased echogenicity of renal parenchyma is noted suggesting medical renal disease. No mass or hydronephrosis visualized. Left Kidney: Renal measurements: 10.8 x 5.7 x 5.5 cm = volume: 176 mL. Increased echogenicity of  renal parenchyma is noted suggesting medical renal disease. No mass or hydronephrosis visualized. Bladder: 7.2 x 6.3 x 4.9 cm multilobulated abnormality is seen in the inferior portion of the urinary bladder concerning for neoplasm or malignancy. Other: None. IMPRESSION: Increased echogenicity of renal parenchyma is noted suggesting medical renal disease. 7.2 x 6.3 x 4.9 cm multilobulated abnormality is seen in the inferior portion of the urinary bladder concerning for neoplasm or malignancy. Cystoscopy is recommended for further evaluation. Electronically Signed   By: Marijo Conception M.D.   On: 10/04/2020 13:47   ECHOCARDIOGRAM COMPLETE  Result Date: 10/02/2020    ECHOCARDIOGRAM REPORT   Patient Name:   Ian Jimenez Date of Exam: 10/01/2020 Medical Rec #:  [id number]   Height:       69.0 in Accession #:    [id number]  Weight:       180.0 lb Date of Birth:  Jun 04, 1934   BSA:          1.976 m Patient Age:    40 years    BP:           127/61 mmHg Patient Gender: M           HR:           80 bpm. Exam Location:  [facility] Procedure: 2D Echo, Cardiac Doppler and Color Doppler Indications:     NSTEMI I21.4  History:         Patient has prior history of Echocardiogram examinations.                  Stroke; Risk Factors:Hypertension.  Sonographer:     Alyse Low Roar Referring Phys:  Unknown Foley NIU Diagnosing Phys: Nelva Bush MD IMPRESSIONS  1. Left ventricular ejection fraction, by estimation, is 25 to 30%. The left ventricle has severely decreased function. The left ventricle demonstrates global hypokinesis. There is mild left ventricular hypertrophy. Left ventricular diastolic parameters  are indeterminate.  2. Right ventricular systolic function is low normal. The right ventricular size is normal. There is moderately elevated pulmonary artery systolic pressure.  3. Suspect ASD present with left-to-right shunting, incompletely imaged. Evidence of atrial level shunting detected by color flow Doppler.  4. The mitral valve is  abnormal. Mild mitral valve regurgitation. No evidence of mitral stenosis.  5. The aortic valve is tricuspid. There is mild calcification of the aortic valve. There is mild thickening of the aortic valve. Aortic valve regurgitation is not visualized. Mild to moderate aortic valve sclerosis/calcification is present, without any evidence of aortic stenosis.  6. Mildly dilated pulmonary artery.  7. The inferior vena cava is dilated in size with <50% respiratory variability, suggesting right atrial pressure of 15 mmHg. FINDINGS  Left Ventricle: Left ventricular ejection fraction, by estimation, is 25 to 30%. The left ventricle has severely decreased function. The left ventricle demonstrates global hypokinesis. The left ventricular internal cavity size was normal in size. There is mild left ventricular hypertrophy. Left ventricular diastolic parameters are indeterminate. Right Ventricle: The right ventricular size is normal. No increase in right ventricular wall thickness. Right ventricular systolic function  is low normal. There is moderately elevated pulmonary artery systolic pressure. The tricuspid regurgitant velocity  is 3.08 m/s, and with an assumed right atrial pressure of 15 mmHg, the estimated right ventricular systolic pressure is 99991111 mmHg. Left Atrium: Left atrial size was normal in size. Right Atrium: Right atrial size was normal in size. Pericardium: Trivial pericardial effusion is present. Presence of pericardial fat pad. Mitral Valve: The mitral valve is abnormal. There is mild thickening of the mitral valve leaflet(s). There is mild calcification of the mitral valve leaflet(s). Mild mitral annular calcification. Mild mitral valve regurgitation. No evidence of mitral valve stenosis. Tricuspid Valve: The tricuspid valve is normal in structure. Tricuspid valve regurgitation is mild. Aortic Valve: The aortic valve is tricuspid. There is mild calcification of the aortic valve. There is mild thickening of the  aortic valve. Aortic valve regurgitation is not visualized. Mild to moderate aortic valve sclerosis/calcification is present, without any evidence of aortic stenosis. Aortic valve peak gradient measures 6.2 mmHg. Pulmonic Valve: The pulmonic valve was normal in structure. Pulmonic valve regurgitation is not visualized. No evidence of pulmonic stenosis. Aorta: The aortic root is normal in size and structure. Pulmonary Artery: The pulmonary artery is mildly dilated. Venous: The inferior vena cava is dilated in size with less than 50% respiratory variability, suggesting right atrial pressure of 15 mmHg. IAS/Shunts: Evidence of atrial level shunting detected by color flow Doppler.  LEFT VENTRICLE PLAX 2D LVIDd:         4.90 cm      Diastology LVIDs:         4.20 cm      LV e' medial:    12.30 cm/s LV PW:         1.20 cm      LV E/e' medial:  9.0 LV IVS:        1.30 cm      LV e' lateral:   11.10 cm/s LVOT diam:     2.10 cm      LV E/e' lateral: 10.0 LVOT Area:     3.46 cm  LV Volumes (MOD) LV vol d, MOD A2C: 107.0 ml LV vol d, MOD A4C: 106.0 ml LV vol s, MOD A2C: 73.0 ml LV vol s, MOD A4C: 74.3 ml LV SV MOD A2C:     34.0 ml LV SV MOD A4C:     106.0 ml LV SV MOD BP:      31.9 ml RIGHT VENTRICLE RV Mid diam:    2.80 cm RV S prime:     10.90 cm/s TAPSE (M-mode): 1.9 cm LEFT ATRIUM             Index       RIGHT ATRIUM           Index LA diam:        4.00 cm 2.02 cm/m  RA Area:     16.90 cm LA Vol (A2C):   64.6 ml 32.70 ml/m RA Volume:   46.10 ml  23.34 ml/m LA Vol (A4C):   41.0 ml 20.75 ml/m LA Biplane Vol: 51.9 ml 26.27 ml/m  AORTIC VALVE                PULMONIC VALVE AV Area (Vmax): 1.95 cm    PV Vmax:       1.13 m/s AV Vmax:        124.00 cm/s PV Peak grad:  5.1 mmHg AV Peak Grad:   6.2 mmHg LVOT Vmax:  69.80 cm/s  AORTA Ao Root diam: 2.80 cm MITRAL VALVE                TRICUSPID VALVE MV Area (PHT): 6.37 cm     TR Peak grad:   37.9 mmHg MV Decel Time: 119 msec     TR Vmax:        308.00 cm/s MV E velocity:  111.00 cm/s                             SHUNTS                             Systemic Diam: 2.10 cm Nelva Bush MD Electronically signed by Nelva Bush MD Signature Date/Time: 10/02/2020/7:17:48 AM    Final    IR NEPHROSTOMY PLACEMENT LEFT  Result Date: 10/01/2020 INDICATION: 87 year old male presenting with presumed urinary sepsis and non ST elevated myocardial infarction, bladder mass suspected. EXAM: 1. ULTRASOUND GUIDANCE FOR PUNCTURE OF THE LEFT RENAL COLLECTING SYSTEM 2. LEFT PERCUTANEOUS NEPHROSTOMY TUBE PLACEMENT. COMPARISON:  None. MEDICATIONS: The patient is receiving appropriate inpatient antibiotic coverage prior to procedure. ANESTHESIA/SEDATION: Moderate (conscious) sedation was employed during this procedure. A total of Versed 0 mg and Fentanyl 25 mcg was administered intravenously. Moderate Sedation Time: 0 minutes. The patient's level of consciousness and vital signs were monitored continuously by radiology nursing throughout the procedure under my direct supervision. CONTRAST:  Twenty mL Isovue 300 - administered into the renal collecting system FLUOROSCOPY TIME:  0.8 minutes, 123456 mGy COMPLICATIONS: None immediate. PROCEDURE: The procedure, risks, benefits, and alternatives were explained to the patient. Questions regarding the procedure were encouraged and answered. The patient understands and consents to the procedure. A timeout was performed prior to the initiation of the procedure. The left flank region was prepped and draped in the usual sterile fashion and a sterile drape was applied covering the operative field. A sterile gown and sterile gloves were used for the procedure. Local anesthesia was provided with 1% Lidocaine. Ultrasound was used to localize the left kidney. Under direct ultrasound guidance, a 20 gauge needle was advanced into the renal collecting system. An ultrasound image documentation was performed. Access within the collecting system was confirmed with the efflux of  urine followed by limited contrast injection. Over a Nitrex wire, the tract was dilated with an Accustick stent. Next, under intermittent fluoroscpic guidance and over a short Amplatz wire, the track was dilated ultimately allowing placement of a 10.2 percutaneous nephrostomy catheter which was advanced to the level of the renal pelvis where the coil was formed and locked. Contrast was injected and several spot fluoroscopic images were obtained in various obliquities. The catheter was secured at the skin with a 0 silk retention suture and stat lock device and connected to a gravity bag was placed. Dressings were applied. The patient tolerated procedure well without immediate postprocedural complication. FINDINGS: Ultrasound scanning demonstrates a moderately dilated left collecting system. Under a combination of ultrasound and fluoroscopic guidance, a posterior inferior calix was targeted allowing placement of a 10.2 percutaneous nephrostomy catheter with end coiled and locked within the renal pelvis. Contrast injection confirmed appropriate positioning. Contrast does not travel beyond the mid ureter. IMPRESSION: Successful ultrasound and fluoroscopic guided placement of a left sided 10.2 [place] PCN. PLAN: Interventional Radiology will arrange for routine check and exchange of indwelling nephrostomy in 4-6 weeks. Ruthann Cancer, MD Vascular and Interventional  [facility] Radiology Electronically Signed   By: Ruthann Cancer MD   On: 10/01/2020 16:21    Labs:  CBC: Recent Labs    10/02/20 1124 10/03/20 [id number] 10/03/20 1405 10/04/20 0604 10/05/20 0439  WBC 9.8 7.7  --  7.3 7.7  HGB 8.7* 7.4* 7.9* 8.0* 8.9*  HCT 26.7* 23.1*  --  24.2* 27.3*  PLT 176 172  --  189 196    COAGS: Recent Labs    10/01/20 1124  INR 1.2  APTT 41*    BMP: Recent Labs    10/02/20 1034 10/03/20 0626 10/04/20 0604 10/05/20 0439  NA 138 142 141 141  K 4.8 4.6 4.2 3.9  CL 104 107 108 108  CO2 21*  '24 24 23  '$ GLUCOSE 172* 132* 188* 196*  BUN 58* 69* 74* 72*  CALCIUM 7.6* 7.6* 7.5* 7.4*  CREATININE 4.03* 3.89* 3.69* 3.48*  GFRNONAA 14* 15* 15* 17*    LIVER FUNCTION TESTS: Recent Labs    08/28/20 1100 10/01/20 1124 10/04/20 0604 10/05/20 0439  BILITOT 0.4 0.7 0.5 0.4  AST 15 19 11* 12*  ALT '12 7 5 '$ <5  ALKPHOS 59 54 48 49  PROT 6.4* 6.5 5.5* 5.3*  ALBUMIN 3.0* 2.7* 2.0* 2.1*    Assessment and Plan:  Hydronephrosis secondary to bladder 'mass'.   S/P left PCN by Dr. Serafina Royals.  Continue routine PCN care.   Will need routine exchange per Urology (unless patient is transferred to hospice, then would avoid any further invasive procedures.)  Will sign off.  Electronically Signed: Murrell Redden, PA-C 10/05/2020, 1:06 PM    I spent a total of 15 Minutes at the the patient's bedside AND on the patient's hospital floor or unit, greater than 50% of which was counseling/coordinating care for f/u PCN

## 2020-10-05 NOTE — Progress Notes (Addendum)
[facility]) Hospital Liaison RN note:  Received request from Asencion Gowda, NP for family interest in Ian Jimenez. Chart reviewed and eligibility was approved. Spoke with daughter, Caren Griffins over the phone to confirm interests and explain services. She verbalized understanding and all questions were answered. Caren Griffins will complete registration paperwork at the [facility] in the morning at [place] will have a room to offer tomorrow. Hospice Home will reach out to weekend [facility] to arrange transport. Hospital care team is aware.  Please call with any hospice related questions or concerns.  Thank you for the opportunity to participate in this patient's care.  Zandra Abts, RN [facility] Liaison [phone]

## 2020-10-05 NOTE — Progress Notes (Signed)
PROGRESS NOTE    Ian Jimenez  [id number] DOB: Oct 02, 1933 DOA: 10/01/2020 PCP: Albina Billet, MD   Chief complaint.  Altered mental status. Brief Narrative:  Ian Jimenez is a 87 y.o. male with medical history significant of HTN, HLD, stroke/TIA, dementia, CKD-III, depression, presents with AMS, hematuria. CT abdomen/pelvis showed a possible blood clots in the bladder, with the possibility of bladder mass.  Patient also has hydronephrosis.  Emergent nephrostomy was performed by IR.  Urine cultures so far growing gram-negative rods, blood culture positive for Klebsiella pneumonia.  Patient has been treated with Rocephin. Patient also had elevated troponin, peaked at 2102.  Has been seen by cardiology, not a good candidate for heart cath due to multiple medical problems including renal failure, and bacteremia.   Assessment & Plan:   Principal Problem:   Acute pyelonephritis Active Problems:   TIA (transient ischemic attack)   Hypertension associated with diabetes ([place])   Depression due to dementia [facility])   NSTEMI (non-ST elevated myocardial infarction) ([place])   Type II diabetes mellitus with renal manifestations ([place])   Acute renal failure superimposed on stage 3a chronic kidney disease ([place])   HLD (hyperlipidemia)   Dementia (H[facility])   Hydronephrosis of left kidney   Acute respiratory failure with hypoxia (H[facility])   Severe sepsis with septic shock ([place])   Septicemia due to Klebsiella pneumoniae (HCC)   HFrEF (heart failure with reduced ejection fraction) ([place])  # Severe sepsis with septic shock secondary to pyelonephritis. # Klebsiella pneumoniae bactermia # Acute pyelonephritis secondary to obstruction secondary to Klebsiella pneumonia. # Left hydronephrosis likely secondary to bladder mass. Patient is status post left nephrostomy. Today transitioned to comfort care  # Non-STEMI. Acute systolic congestive heart failure with ejection fraction 25 to 30%. Moderate pulmonary  hypertension. Acute hypoxemic respiratory failure. Patient has significant elevation of troponin, followed by cardiology. He is not a good candidate for heart cath due to medical conditions. Patient had echocardiogram from 2019, ejection fraction was normal. I am not certain about the patient volume status, she has elevated BNP, but this is associated with worsening renal function.  Chest x-ray does not seem to have increased pulmonary edema. - today transitioned to comfort care  #.  Acute kidney injury on chronic kidney disease stage IIIa versus stage IV. Due to patient previous record, new early 2020, creatinine was 1.5, later the year creatinine increased to 3.5, this might reflect an obstruction from bladder mass. Patient is status post left nephrostomy, will monitor renal function until patient renal function reaches baseline. No hyperkalemia or acidosis. Kidney function slowly improving - comfort care  #   Type 2 diabetes. Well controlled.  # .  Essential hypertension. Controlled  #  Parkinson disease with dementia. Continue home medicines.   n Code Status: DNR Family Communication: daughter updated @ bedside Disposition Plan: inpt hospice, likely d/c tomorrow morning .   Status is: Inpatient  Remains inpatient appropriate because:Inpatient level of care appropriate due to severity of illness   Dispo: The patient is from: Home              Anticipated d/c is to: inpt hospice              Patient currently is not medically stable to d/c.   Difficult to place patient No        I/O last 3 completed shifts: In: 2497 [I.V.:2397; IV Piggyback:100] Out: 600 [Urine:375; Drains:225] No intake/output data recorded.     Consultants:  Cardiology, nephrology, urologist  Procedures: Left nephrostomy.  Antimicrobials:  Rocephin  Subjective: Asleep  Objective: Vitals:   10/04/20 1919 10/05/20 0502 10/05/20 0813 10/05/20 1220  BP: (!) 141/64 (!) 146/63 (!)  155/79 124/61  Pulse: 68 65 67 (!) 59  Resp: '17 16 18 19  '$ Temp: (!) 97.4 F (36.3 C) 97.7 F (36.5 C) 98.6 F (37 C) 97.8 F (36.6 C)  TempSrc: Oral Oral Oral   SpO2: 98% 98% 98% 96%  Weight:  74 kg    Height:        Intake/Output Summary (Last 24 hours) at 10/05/2020 1514 Last data filed at 10/05/2020 1220 Gross per 24 hour  Intake 1321.14 ml  Output 0 ml  Net 1321.14 ml   Filed Weights   10/03/20 0444 10/04/20 0500 10/05/20 0502  Weight: 70 kg 70.2 kg 74 kg    Examination:  General exam: Appears calm and comfortable, ill appearing  Respiratory system: Clear to auscultation. Respiratory effort normal. Cardiovascular system: S1 & S2 heard, RRR. No JVD, murmurs, rubs, gallops or clicks. No pedal edema. Gastrointestinal system: Abdomen is mildly distended, soft and nontender. No organomegaly or masses felt. Normal bowel sounds heard. Central nervous system: Drowsy, minimally responsive Extremities: Symmetric  Skin: No rashes, lesions or ulcers. Pale      Data Reviewed: I have personally reviewed following labs and imaging studies  CBC: Recent Labs  Lab 10/01/20 1124 10/02/20 0725 10/02/20 1124 10/03/20 0626 10/03/20 1405 10/04/20 0604 10/05/20 0439  WBC 19.5* 10.9* 9.8 7.7  --  7.3 7.7  NEUTROABS 18.0*  --  8.5* 6.5  --   --   --   HGB 9.7* 8.0* 8.7* 7.4* 7.9* 8.0* 8.9*  HCT 29.0* 25.0* 26.7* 23.1*  --  24.2* 27.3*  MCV 91.2 91.6 92.4 92.8  --  91.3 91.9  PLT 237 196 176 172  --  [id number]   Basic Metabolic Panel: Recent Labs  Lab 10/01/20 1124 10/02/20 1034 10/03/20 0626 10/04/20 0604 10/05/20 0439  NA 140 138 142 141 141  K 4.2 4.8 4.6 4.2 3.9  CL 104 104 107 108 108  CO2 23 21* '24 24 23  '$ GLUCOSE 184* 172* 132* 188* 196*  BUN 48* 58* 69* 74* 72*  CREATININE 3.98* 4.03* 3.89* 3.69* 3.48*  CALCIUM 8.2* 7.6* 7.6* 7.5* 7.4*  MG  --   --  2.4  --   --    GFR: Estimated Creatinine Clearance: 15.8 mL/min (A) (by C-G formula based on SCr of 3.48 mg/dL  (H)). Liver Function Tests: Recent Labs  Lab 10/01/20 1124 10/04/20 0604 10/05/20 0439  AST 19 11* 12*  ALT 7 5 <5  ALKPHOS 54 48 49  BILITOT 0.7 0.5 0.4  PROT 6.5 5.5* 5.3*  ALBUMIN 2.7* 2.0* 2.1*   No results for input(s): LIPASE, AMYLASE in the last 168 hours. No results for input(s): AMMONIA in the last 168 hours. Coagulation Profile: Recent Labs  Lab 10/01/20 1124  INR 1.2   Cardiac Enzymes: No results for input(s): CKTOTAL, CKMB, CKMBINDEX, TROPONINI in the last 168 hours. BNP (last 3 results) No results for input(s): PROBNP in the last 8760 hours. HbA1C: No results for input(s): HGBA1C in the last 72 hours. CBG: Recent Labs  Lab 10/04/20 1255 10/04/20 1622 10/04/20 2051 10/05/20 0825 10/05/20 1155  GLUCAP 193* 138* 150* 207* 178*   Lipid Profile: No results for input(s): CHOL, HDL, LDLCALC, TRIG, CHOLHDL, LDLDIRECT in the last 72 hours. Thyroid Function Tests: No  results for input(s): TSH, T4TOTAL, FREET4, T3FREE, THYROIDAB in the last 72 hours. Anemia Panel: No results for input(s): VITAMINB12, FOLATE, FERRITIN, TIBC, IRON, RETICCTPCT in the last 72 hours. Sepsis Labs: Recent Labs  Lab 10/01/20 1105 10/01/20 1525 10/01/20 2333 10/02/20 0145  PROCALCITON  --  71.94  --   --   LATICACIDVEN 5.1* 3.7* 2.9* 1.9    Recent Results (from the past 240 hour(s))  Urine Culture     Status: Abnormal   Collection Time: 10/01/20 11:24 AM   Specimen: Urine, Random  Result Value Ref Range Status   Specimen Description   Final    URINE, RANDOM Performed at [facility], [address], [place], [place] [address]    Special Requests   Final    NONE Performed at [facility], [address] [place], [place] [address]    Culture >=100,000 COLONIES/mL KLEBSIELLA PNEUMONIAE (A)  Final   Report Status 10/03/2020 FINAL  Final   Organism ID, Bacteria KLEBSIELLA PNEUMONIAE (A)  Final      Susceptibility   Klebsiella pneumoniae - MIC*     AMPICILLIN RESISTANT Resistant     CEFAZOLIN <=4 SENSITIVE Sensitive     CEFEPIME <=0.12 SENSITIVE Sensitive     CEFTRIAXONE <=0.25 SENSITIVE Sensitive     CIPROFLOXACIN <=0.25 SENSITIVE Sensitive     GENTAMICIN <=1 SENSITIVE Sensitive     IMIPENEM <=0.25 SENSITIVE Sensitive     NITROFURANTOIN 32 SENSITIVE Sensitive     TRIMETH/SULFA <=20 SENSITIVE Sensitive     AMPICILLIN/SULBACTAM 4 SENSITIVE Sensitive     PIP/TAZO <=4 SENSITIVE Sensitive     * >=100,000 COLONIES/mL KLEBSIELLA PNEUMONIAE  Blood culture (routine x 2)     Status: Abnormal   Collection Time: 10/01/20 11:24 AM   Specimen: BLOOD  Result Value Ref Range Status   Specimen Description   Final    BLOOD RIGHT ANTECUBITAL Performed at [facility], [address] [place], [place] [address]    Special Requests   Final    BOTTLES DRAWN AEROBIC AND ANAEROBIC Blood Culture adequate volume Performed at [facility], [address] [place], [place] [address]    Culture  Setup Time   Final    GRAM NEGATIVE RODS IN BOTH AEROBIC AND ANAEROBIC BOTTLES CRITICAL RESULT CALLED TO, READ BACK BY AND VERIFIED WITH: JASON ROBBINS '@0255'$  10/02/20 RH Performed at [facility], [place] [address] [place] [address], [place], [place] [address]    Culture KLEBSIELLA PNEUMONIAE (A)  Final   Report Status 10/05/2020 FINAL  Final   Organism ID, Bacteria KLEBSIELLA PNEUMONIAE  Final      Susceptibility   Klebsiella pneumoniae - MIC*    AMPICILLIN >=32 RESISTANT Resistant     CEFAZOLIN <=4 SENSITIVE Sensitive     CEFEPIME <=0.12 SENSITIVE Sensitive     CEFTAZIDIME <=1 SENSITIVE Sensitive     CEFTRIAXONE <=0.25 SENSITIVE Sensitive     CIPROFLOXACIN <=0.25 SENSITIVE Sensitive     GENTAMICIN <=1 SENSITIVE Sensitive     IMIPENEM <=0.25 SENSITIVE Sensitive     TRIMETH/SULFA <=20 SENSITIVE Sensitive     AMPICILLIN/SULBACTAM 4 SENSITIVE Sensitive     PIP/TAZO <=4 SENSITIVE Sensitive     * KLEBSIELLA PNEUMONIAE  Blood Culture ID Panel  (Reflexed)     Status: Abnormal   Collection Time: 10/01/20 11:24 AM  Result Value Ref Range Status   Enterococcus faecalis NOT DETECTED NOT DETECTED Final   Enterococcus Faecium NOT DETECTED NOT DETECTED Final   Listeria monocytogenes  NOT DETECTED NOT DETECTED Final   Staphylococcus species NOT DETECTED NOT DETECTED Final   Staphylococcus aureus (BCID) NOT DETECTED NOT DETECTED Final   Staphylococcus epidermidis NOT DETECTED NOT DETECTED Final   Staphylococcus lugdunensis NOT DETECTED NOT DETECTED Final   Streptococcus species NOT DETECTED NOT DETECTED Final   Streptococcus agalactiae NOT DETECTED NOT DETECTED Final   Streptococcus pneumoniae NOT DETECTED NOT DETECTED Final   Streptococcus pyogenes NOT DETECTED NOT DETECTED Final   A.calcoaceticus-baumannii NOT DETECTED NOT DETECTED Final   Bacteroides fragilis NOT DETECTED NOT DETECTED Final   Enterobacterales DETECTED (A) NOT DETECTED Final    Comment: Enterobacterales represent a large order of gram negative bacteria, not a single organism. CRITICAL RESULT CALLED TO, READ BACK BY AND VERIFIED WITH: JASON ROBBINS '@0255'$  10/02/20 RH    Enterobacter cloacae complex NOT DETECTED NOT DETECTED Final   Escherichia coli NOT DETECTED NOT DETECTED Final   Klebsiella aerogenes NOT DETECTED NOT DETECTED Final   Klebsiella oxytoca NOT DETECTED NOT DETECTED Final   Klebsiella pneumoniae DETECTED (A) NOT DETECTED Final    Comment: CRITICAL RESULT CALLED TO, READ BACK BY AND VERIFIED WITH: JASON ROBBINS '@0255'$  10/02/20 RH    Proteus species NOT DETECTED NOT DETECTED Final   Salmonella species NOT DETECTED NOT DETECTED Final   Serratia marcescens NOT DETECTED NOT DETECTED Final   Haemophilus influenzae NOT DETECTED NOT DETECTED Final   Neisseria meningitidis NOT DETECTED NOT DETECTED Final   Pseudomonas aeruginosa NOT DETECTED NOT DETECTED Final   Stenotrophomonas maltophilia NOT DETECTED NOT DETECTED Final   Candida albicans NOT DETECTED NOT  DETECTED Final   Candida auris NOT DETECTED NOT DETECTED Final   Candida glabrata NOT DETECTED NOT DETECTED Final   Candida krusei NOT DETECTED NOT DETECTED Final   Candida parapsilosis NOT DETECTED NOT DETECTED Final   Candida tropicalis NOT DETECTED NOT DETECTED Final   Cryptococcus neoformans/gattii NOT DETECTED NOT DETECTED Final   CTX-M ESBL NOT DETECTED NOT DETECTED Final   Carbapenem resistance IMP NOT DETECTED NOT DETECTED Final   Carbapenem resistance KPC NOT DETECTED NOT DETECTED Final   Carbapenem resistance NDM NOT DETECTED NOT DETECTED Final   Carbapenem resist OXA 48 LIKE NOT DETECTED NOT DETECTED Final   Carbapenem resistance VIM NOT DETECTED NOT DETECTED Final    Comment: Performed at [facility], [address] [place], [place] [address]  SARS CORONAVIRUS 2 (TAT 6-24 HRS) Nasopharyngeal Nasopharyngeal Swab     Status: None   Collection Time: 10/01/20 12:36 PM   Specimen: Nasopharyngeal Swab  Result Value Ref Range Status   SARS Coronavirus 2 NEGATIVE NEGATIVE Final    Comment: (NOTE) SARS-CoV-2 target nucleic acids are NOT DETECTED.  The SARS-CoV-2 RNA is generally detectable in upper and lower respiratory specimens during the acute phase of infection. Negative results do not preclude SARS-CoV-2 infection, do not rule out co-infections with other pathogens, and should not be used as the sole basis for treatment or other patient management decisions. Negative results must be combined with clinical observations, patient history, and epidemiological information. The expected result is Negative.  Fact Sheet for Patients: SugarRoll.be  Fact Sheet for Healthcare Providers: https://www.woods-mathews.com/  This test is not yet approved or cleared by the [place] FDA and  has been authorized for detection and/or diagnosis of SARS-CoV-2 by FDA under an Emergency Use Authorization (EUA). This EUA will remain  in  effect (meaning this test can be used) for the duration of the COVID-19 declaration under Se ction 564(b)(1) of  the Act, 21 U.S.C. section 360bbb-3(b)(1), unless the authorization is terminated or revoked sooner.  Performed at [facility], [place] [address], [place], [place] [address]   Blood culture (routine x 2)     Status: None (Preliminary result)   Collection Time: 10/01/20 12:46 PM   Specimen: BLOOD  Result Value Ref Range Status   Specimen Description BLOOD BOTTOM LEFT HAND  Final   Special Requests   Final    BOTTLES DRAWN AEROBIC AND ANAEROBIC Blood Culture adequate volume   Culture   Final    NO GROWTH 4 DAYS Performed at [facility], [address] [place], [place] [address]    Report Status PENDING  Incomplete  Urine culture     Status: Abnormal   Collection Time: 10/01/20  4:08 PM   Specimen: Urine, Catheterized  Result Value Ref Range Status   Specimen Description URINE, CATHETERIZED  Final   Special Requests   Final    SYRINGE Performed at [facility], [place] [address] [place],  [address]    Culture >=100,000 COLONIES/mL KLEBSIELLA PNEUMONIAE (A)  Final   Report Status 10/04/2020 FINAL  Final   Organism ID, Bacteria KLEBSIELLA PNEUMONIAE (A)  Final      Susceptibility   Klebsiella pneumoniae - MIC*    AMPICILLIN >=32 RESISTANT Resistant     CEFAZOLIN <=4 SENSITIVE Sensitive     CEFEPIME <=0.12 SENSITIVE Sensitive     CEFTRIAXONE <=0.25 SENSITIVE Sensitive     CIPROFLOXACIN <=0.25 SENSITIVE Sensitive     GENTAMICIN <=1 SENSITIVE Sensitive     IMIPENEM <=0.25 SENSITIVE Sensitive     NITROFURANTOIN 64 INTERMEDIATE Intermediate     TRIMETH/SULFA <=20 SENSITIVE Sensitive     AMPICILLIN/SULBACTAM 8 SENSITIVE Sensitive     PIP/TAZO <=4 SENSITIVE Sensitive     * >=100,000 COLONIES/mL KLEBSIELLA PNEUMONIAE         Radiology Studies: US RENAL  Result Date: 10/04/2020 CLINICAL DATA:  Acute kidney injury. EXAM: RENAL / URINARY  TRACT ULTRASOUND COMPLETE COMPARISON:  July 12, 2018.  October 01, 2020. FINDINGS: Right Kidney: Renal measurements: 8.8 x 5.3 x 5.0 cm = volume: 121 mL. Increased echogenicity of renal parenchyma is noted suggesting medical renal disease. No mass or hydronephrosis visualized. Left Kidney: Renal measurements: 10.8 x 5.7 x 5.5 cm = volume: 176 mL. Increased echogenicity of renal parenchyma is noted suggesting medical renal disease. No mass or hydronephrosis visualized. Bladder: 7.2 x 6.3 x 4.9 cm multilobulated abnormality is seen in the inferior portion of the urinary bladder concerning for neoplasm or malignancy. Other: None. IMPRESSION: Increased echogenicity of renal parenchyma is noted suggesting medical renal disease. 7.2 x 6.3 x 4.9 cm multilobulated abnormality is seen in the inferior portion of the urinary bladder concerning for neoplasm or malignancy. Cystoscopy is recommended for further evaluation. Electronically Signed   By: Marijo Conception M.D.   On: 10/04/2020 13:47        Scheduled Meds: . divalproex  250 mg Oral BID  . iodixanol  50 mL Other Once  . sodium chloride flush  3 mL Intravenous Q12H  . sodium chloride flush  5 mL Intracatheter Q8H   Continuous Infusions:    LOS: 4 days    Time spent: 34 minutes    Desma Maxim, MD [facility]   To contact the attending provider between 7A-7P or the covering provider during after hours 7P-7A, please log into the web site [email or web address] and access using universal [facility] password for that web site. If you do not have the password, please call the hospital operator.  10/05/2020, 3:14 PM

## 2020-10-06 LAB — CULTURE, BLOOD (ROUTINE X 2)
Culture: NO GROWTH
Special Requests: ADEQUATE

## 2020-10-06 MED ORDER — MORPHINE SULFATE (PF) 2 MG/ML IV SOLN: 1.0000 mg | INTRAVENOUS | 0 refills | Status: AC | PRN

## 2020-10-06 MED ORDER — POLYVINYL ALCOHOL 1.4 % OP SOLN: 1.0000 [drp] | Freq: Four times a day (QID) | OPHTHALMIC | 0 refills | Status: AC | PRN

## 2020-10-06 MED ORDER — ALBUTEROL SULFATE (2.5 MG/3ML) 0.083% IN NEBU
2.5000 mg | INHALATION_SOLUTION | RESPIRATORY_TRACT | 12 refills | Status: AC | PRN
Start: 2020-10-06 — End: ?

## 2020-10-06 MED ORDER — BIOTENE DRY MOUTH MT LIQD: 15.0000 mL | OROMUCOSAL | Status: AC | PRN

## 2020-10-06 MED ORDER — DM-GUAIFENESIN ER 30-600 MG PO TB12: 1.0000 | ORAL_TABLET | Freq: Two times a day (BID) | ORAL | Status: AC | PRN

## 2020-10-06 MED ORDER — GLYCOPYRROLATE 1 MG PO TABS: 1.0000 mg | ORAL_TABLET | ORAL | Status: AC | PRN

## 2020-10-06 MED ORDER — ONDANSETRON 4 MG PO TBDP: 4.0000 mg | ORAL_TABLET | Freq: Four times a day (QID) | ORAL | 0 refills | Status: AC | PRN

## 2020-10-06 MED ORDER — HALOPERIDOL 0.5 MG PO TABS: 0.5000 mg | ORAL_TABLET | ORAL | Status: AC | PRN

## 2020-10-06 NOTE — Progress Notes (Signed)
Patient discharged per orders. AVS provided in receiving facility packet. Report called; facility aware  Of departure. Transportation provided by medical transport.

## 2020-10-06 NOTE — Discharge Summary (Signed)
Ian Jimenez [id number] DOB: [birth year] DOA: 10/01/2020  PCP: Albina Billet, MD  Admit date: 10/01/2020 Discharge date: 10/06/2020  Time spent: 35 minutes    Discharge Diagnoses:  Principal Problem:   Acute pyelonephritis Active Problems:   TIA (transient ischemic attack)   Hypertension associated with diabetes ([place])   Depression due to dementia [facility])   NSTEMI (non-ST elevated myocardial infarction) ([place])   Type II diabetes mellitus with renal manifestations ([place])   Acute renal failure superimposed on stage 3a chronic kidney disease ([place])   HLD (hyperlipidemia)   Dementia ([place])   Hydronephrosis of left kidney   Acute respiratory failure with hypoxia ([place])   Severe sepsis with septic shock ([place])   Septicemia due to Klebsiella pneumoniae ([place])   HFrEF (heart failure with reduced ejection fraction) ([place])   Discharge Condition: stable  Diet recommendation: regular  Filed Weights   10/04/20 0500 10/05/20 0502 10/05/20 2100  Weight: 70.2 kg 74 kg 74.7 kg    History of present illness:  Ian Jimenez is a 87 y.o. male with medical history significant of HTN, HLD, stroke/TIA, dementia, CKD-III, depression, presents with AMS, hematuria.  Per his daughter, at her normal baseline, patient recognizes and talks to her, and is able to walk.  Sometimes patient knows the place, but usually not oriented to the time.  Patient was found to be more confused today. He is not orientated x3.  No facial droop or slurred speech.  Patient has chronic cough which has not changed.  Does not seem to have chest pain, respiratory distress, no nausea, vomiting, diarrhea noted.  Patient has gross blood in urine. Not know if patient has symptoms of UTI.  ED Course: pt was found to have WBC 19.5, lactic acid of 5.1, troponin level 2 6, 235, urinalysis (turbid appearance, no bacteria, WBC> 50), temperature 100.1, blood pressure 119/56, heart rate 102, RR 25, oxygen saturations 88%, which improved to 98% on 2 L  oxygen.  CT head is negative for acute intracranial abnormalities.  CT scan showed left-sided hydronephrosis without obstructive stone, suspecting possible bladder mass. Patient is admitted to progressive bed as inpatient.  Patient underwent urgent left nephrostomy tube placement by Dr. Serafina Royals of IR. Dr. Erlene Quan of urology and Dr. Fletcher Anon of card are consulted.  Hospital Course:  Patient presented with septic shock secondary to pyelonephritis/klebsiella pneumonia bacteremia. He was treated with antibiotics. 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 IR placed nephrostomy tube. Sepsis physiology resolved. Patient also with nstemi, evaluated by cardiology, initially treated with heparin which was discontinued given bleeding bladder mass, no intervention advised given poor health and prognosis. Also with aki on ckd 2/2 obstructive bladder mass, sepsis. Also with parkinsons demential. Palliative care consulted, transitioned to comfort care 3/4. Discharged to inpatient hospice.   Procedures:  IR nephrostomy tube placement   Consultations:  IR, nephrology, urology, cardiology  Discharge Exam: Vitals:   10/05/20 1602 10/06/20 1223  BP: (!) 146/64 (!) 160/68  Pulse: (!) 57 66  Resp: 19 18  Temp: 97.7 F (36.5 C) 98.6 F (37 C)  SpO2: 98% 97%    General exam: Appears calm and comfortable, ill appearing  Respiratory system: shallow respirations. Cardiovascular system: rrr Gastrointestinal system: Abdomen is mildly distended, soft Central nervous system: sleeping Extremities: Symmetric  Skin: No rashes, lesions or ulcers. Pale  Discharge Instructions   Discharge Instructions    Diet general   Complete by: As directed    Increase activity slowly  Complete by: As directed    No wound care   Complete by: As directed      Allergies as of 10/06/2020   No Known Allergies     Medication List    STOP taking these medications   acetaminophen  325 MG tablet Commonly known as: TYLENOL   amLODipine 10 MG tablet Commonly known as: NORVASC   carbidopa-levodopa 25-100 MG tablet Commonly known as: SINEMET IR   clopidogrel 75 MG tablet Commonly known as: PLAVIX   divalproex 250 MG DR tablet Commonly known as: DEPAKOTE   donepezil 10 MG tablet Commonly known as: ARICEPT   escitalopram 5 MG tablet Commonly known as: LEXAPRO   glipiZIDE 5 MG tablet Commonly known as: GLUCOTROL   hydrALAZINE 25 MG tablet Commonly known as: APRESOLINE   loperamide 2 MG capsule Commonly known as: IMODIUM   metFORMIN 1000 MG tablet Commonly known as: GLUCOPHAGE   metoprolol tartrate 25 MG tablet Commonly known as: LOPRESSOR   NON FORMULARY   omeprazole 40 MG capsule Commonly known as: PRILOSEC     TAKE these medications   albuterol (2.5 MG/3ML) 0.083% nebulizer solution Commonly known as: PROVENTIL Take 3 mLs (2.5 mg total) by nebulization every 4 (four) hours as needed for wheezing or shortness of breath.   antiseptic oral rinse Liqd Apply 15 mLs topically as needed for dry mouth.   dextromethorphan-guaiFENesin 30-600 MG 12hr tablet Commonly known as: MUCINEX DM Take 1 tablet by mouth 2 (two) times daily as needed for cough.   glycopyrrolate 1 MG tablet Commonly known as: ROBINUL Take 1 tablet (1 mg total) by mouth every 4 (four) hours as needed (excessive secretions).   haloperidol 0.5 MG tablet Commonly known as: HALDOL Take 1 tablet (0.5 mg total) by mouth every 4 (four) hours as needed for agitation (or delirium).   morphine 2 MG/ML injection Inject 0.5 mLs (1 mg total) into the vein every 2 (two) hours as needed (or dyspnea).   ondansetron 4 MG disintegrating tablet Commonly known as: ZOFRAN-ODT Take 1 tablet (4 mg total) by mouth every 6 (six) hours as needed for nausea. What changed:   when to take this  reasons to take this   polyvinyl alcohol 1.4 % ophthalmic solution Commonly known as: LIQUIFILM  TEARS Place 1 drop into both eyes 4 (four) times daily as needed for dry eyes.      No Known Allergies    The results of significant diagnostics from this hospitalization (including imaging, microbiology, ancillary and laboratory) are listed below for reference.    Significant Diagnostic Studies: CT ABDOMEN PELVIS WO CONTRAST  Result Date: 10/01/2020 CLINICAL DATA:  Abdominal pain and hematuria EXAM: CT ABDOMEN AND PELVIS WITHOUT CONTRAST TECHNIQUE: Multidetector CT imaging of the abdomen and pelvis was performed following the standard protocol without IV contrast. COMPARISON:  Ultrasound from 07/12/2018 FINDINGS: Lower chest: Small left pleural effusion is noted with mild bibasilar atelectasis. Hepatobiliary: No focal liver abnormality is seen. No gallstones, gallbladder wall thickening, or biliary dilatation. Pancreas: Hypodensity in the region of the head of the pancreas which corresponds to a cystic lesion seen on the prior ultrasound examination. It measures approximately 15 mm. The remainder of the pancreas is within normal limits. Spleen: Normal in size without focal abnormality. Adrenals/Urinary Tract: Left adrenal gland is within normal limits. Right adrenal gland demonstrates a small 15 mm lesion best seen on image number 29 of series 2. Statistically this likely represents an adenoma. Right kidney is well visualized without obstructive  change or renal calculi. Left kidney demonstrates evidence of small nonobstructing lower pole renal stone as well as hydronephrosis and hydroureter which extends to the level of the urinary bladder. No obstructing stone is noted. The bladder is decompressed and irregular hyperdense material is noted within the bladder likely representing thrombus given the clinical history. Underlying mass lesion at the bladder trigone may be present given the left-sided hydronephrosis. Stomach/Bowel: No obstructive or inflammatory changes of the colon are seen. The appendix  is within normal limits. No small bowel or gastric abnormality is seen. Vascular/Lymphatic: Aortic atherosclerosis. No enlarged abdominal or pelvic lymph nodes. Reproductive: Prostate is unremarkable. Other: No abdominal wall hernia or abnormality. No abdominopelvic ascites. Musculoskeletal: Degenerative changes of lumbar spine are noted. IMPRESSION: Left-sided hydronephrosis is noted without obstructing renal stone. This raises suspicion for possible bladder mass. Additionally hyperdense material is noted within the bladder consistent with thrombus. Direct visualization is recommended for further evaluation. Nonobstructing lower pole left renal stone. Stable hypodensity within the head of the pancreas unchanged from 2019. Small right renal lesion likely representing an adenoma but incompletely evaluated on this exam. No other focal abnormality is noted. Electronically Signed   By: Inez Catalina M.D.   On: 10/01/2020 13:25   CT Head Wo Contrast  Result Date: 10/01/2020 CLINICAL DATA:  Mental status changes. EXAM: CT HEAD WITHOUT CONTRAST TECHNIQUE: Contiguous axial images were obtained from the base of the skull through the vertex without intravenous contrast. COMPARISON:  06/14/2020 FINDINGS: Brain: There is no evidence for acute hemorrhage, hydrocephalus, mass lesion, or abnormal extra-axial fluid collection. No definite CT evidence for acute infarction. Diffuse loss of parenchymal volume is consistent with atrophy. Patchy low attenuation in the deep hemispheric and periventricular white matter is nonspecific, but likely reflects chronic microvascular ischemic demyelination. Nonacute left parietooccipital infarct is new since prior. Vascular: No hyperdense vessel or unexpected calcification. Skull: No evidence for fracture. No worrisome lytic or sclerotic lesion. Sinuses/Orbits: The visualized paranasal sinuses and mastoid air cells are clear. Visualized portions of the globes and intraorbital fat are  unremarkable. Other: None. IMPRESSION: 1. No acute intracranial abnormality. 2. Nonacute left parietooccipital infarct, new since prior. 3. Atrophy with chronic small vessel white matter ischemic disease. Electronically Signed   By: Misty Stanley M.D.   On: 10/01/2020 13:25   [place] Intraoperative  Result Date: 10/01/2020 CLINICAL DATA:  Ultrasound was provided for use by the ordering physician.  No provider Interpretation or professional fees incurred.    US RENAL  Result Date: 10/04/2020 CLINICAL DATA:  Acute kidney injury. EXAM: RENAL / URINARY TRACT ULTRASOUND COMPLETE COMPARISON:  July 12, 2018.  October 01, 2020. FINDINGS: Right Kidney: Renal measurements: 8.8 x 5.3 x 5.0 cm = volume: 121 mL. Increased echogenicity of renal parenchyma is noted suggesting medical renal disease. No mass or hydronephrosis visualized. Left Kidney: Renal measurements: 10.8 x 5.7 x 5.5 cm = volume: 176 mL. Increased echogenicity of renal parenchyma is noted suggesting medical renal disease. No mass or hydronephrosis visualized. Bladder: 7.2 x 6.3 x 4.9 cm multilobulated abnormality is seen in the inferior portion of the urinary bladder concerning for neoplasm or malignancy. Other: None. IMPRESSION: Increased echogenicity of renal parenchyma is noted suggesting medical renal disease. 7.2 x 6.3 x 4.9 cm multilobulated abnormality is seen in the inferior portion of the urinary bladder concerning for neoplasm or malignancy. Cystoscopy is recommended for further evaluation. Electronically Signed   By: Marijo Conception M.D.   On: 10/04/2020 13:47   DG  Chest Portable 1 View  Result Date: 10/01/2020 CLINICAL DATA:  87 year old male with altered mental status onset this morning. EXAM: PORTABLE CHEST 1 VIEW COMPARISON:  Portable chest 11/25/2018 and earlier. FINDINGS: Portable AP semi upright view at 1137 hours. The patient is more rotated to the left. Possible cardiomegaly which is new since 2020. Other mediastinal contours are  within normal limits. Subsequent left lung base hypo ventilation. But elsewhere Allowing for portable technique the lungs are clear. No pneumothorax. No acute osseous abnormality identified. Paucity of bowel gas in the upper abdomen. IMPRESSION: 1. Questionable new or increased cardiomegaly since 2020, associated left lung base atelectasis. 2. No other acute cardiopulmonary abnormality. Electronically Signed   By: Genevie Ann M.D.   On: 10/01/2020 11:56   ECHOCARDIOGRAM COMPLETE  Result Date: 10/02/2020    ECHOCARDIOGRAM REPORT   Patient Name:   Ian Jimenez Date of Exam: 10/01/2020 Medical Rec #:  [id number]   Height:       69.0 in Accession #:    [id number]  Weight:       180.0 lb Date of Birth:  09-11-1933   BSA:          1.976 m Patient Age:    64 years    BP:           127/61 mmHg Patient Gender: M           HR:           80 bpm. Exam Location:  [facility] Procedure: 2D Echo, Cardiac Doppler and Color Doppler Indications:     NSTEMI I21.4  History:         Patient has prior history of Echocardiogram examinations.                  Stroke; Risk Factors:Hypertension.  Sonographer:     Alyse Low Roar Referring Phys:  Unknown Foley NIU Diagnosing Phys: Nelva Bush MD IMPRESSIONS  1. Left ventricular ejection fraction, by estimation, is 25 to 30%. The left ventricle has severely decreased function. The left ventricle demonstrates global hypokinesis. There is mild left ventricular hypertrophy. Left ventricular diastolic parameters  are indeterminate.  2. Right ventricular systolic function is low normal. The right ventricular size is normal. There is moderately elevated pulmonary artery systolic pressure.  3. Suspect ASD present with left-to-right shunting, incompletely imaged. Evidence of atrial level shunting detected by color flow Doppler.  4. The mitral valve is abnormal. Mild mitral valve regurgitation. No evidence of mitral stenosis.  5. The aortic valve is tricuspid. There is mild calcification of the aortic valve. There  is mild thickening of the aortic valve. Aortic valve regurgitation is not visualized. Mild to moderate aortic valve sclerosis/calcification is present, without any evidence of aortic stenosis.  6. Mildly dilated pulmonary artery.  7. The inferior vena cava is dilated in size with <50% respiratory variability, suggesting right atrial pressure of 15 mmHg. FINDINGS  Left Ventricle: Left ventricular ejection fraction, by estimation, is 25 to 30%. The left ventricle has severely decreased function. The left ventricle demonstrates global hypokinesis. The left ventricular internal cavity size was normal in size. There is mild left ventricular hypertrophy. Left ventricular diastolic parameters are indeterminate. Right Ventricle: The right ventricular size is normal. No increase in right ventricular wall thickness. Right ventricular systolic function is low normal. There is moderately elevated pulmonary artery systolic pressure. The tricuspid regurgitant velocity  is 3.08 m/s, and with an assumed right atrial pressure of 15 mmHg, the estimated right ventricular systolic  pressure is 52.9 mmHg. Left Atrium: Left atrial size was normal in size. Right Atrium: Right atrial size was normal in size. Pericardium: Trivial pericardial effusion is present. Presence of pericardial fat pad. Mitral Valve: The mitral valve is abnormal. There is mild thickening of the mitral valve leaflet(s). There is mild calcification of the mitral valve leaflet(s). Mild mitral annular calcification. Mild mitral valve regurgitation. No evidence of mitral valve stenosis. Tricuspid Valve: The tricuspid valve is normal in structure. Tricuspid valve regurgitation is mild. Aortic Valve: The aortic valve is tricuspid. There is mild calcification of the aortic valve. There is mild thickening of the aortic valve. Aortic valve regurgitation is not visualized. Mild to moderate aortic valve sclerosis/calcification is present, without any evidence of aortic stenosis.  Aortic valve peak gradient measures 6.2 mmHg. Pulmonic Valve: The pulmonic valve was normal in structure. Pulmonic valve regurgitation is not visualized. No evidence of pulmonic stenosis. Aorta: The aortic root is normal in size and structure. Pulmonary Artery: The pulmonary artery is mildly dilated. Venous: The inferior vena cava is dilated in size with less than 50% respiratory variability, suggesting right atrial pressure of 15 mmHg. IAS/Shunts: Evidence of atrial level shunting detected by color flow Doppler.  LEFT VENTRICLE PLAX 2D LVIDd:         4.90 cm      Diastology LVIDs:         4.20 cm      LV e' medial:    12.30 cm/s LV PW:         1.20 cm      LV E/e' medial:  9.0 LV IVS:        1.30 cm      LV e' lateral:   11.10 cm/s LVOT diam:     2.10 cm      LV E/e' lateral: 10.0 LVOT Area:     3.46 cm  LV Volumes (MOD) LV vol d, MOD A2C: 107.0 ml LV vol d, MOD A4C: 106.0 ml LV vol s, MOD A2C: 73.0 ml LV vol s, MOD A4C: 74.3 ml LV SV MOD A2C:     34.0 ml LV SV MOD A4C:     106.0 ml LV SV MOD BP:      31.9 ml RIGHT VENTRICLE RV Mid diam:    2.80 cm RV S prime:     10.90 cm/s TAPSE (M-mode): 1.9 cm LEFT ATRIUM             Index       RIGHT ATRIUM           Index LA diam:        4.00 cm 2.02 cm/m  RA Area:     16.90 cm LA Vol (A2C):   64.6 ml 32.70 ml/m RA Volume:   46.10 ml  23.34 ml/m LA Vol (A4C):   41.0 ml 20.75 ml/m LA Biplane Vol: 51.9 ml 26.27 ml/m  AORTIC VALVE                PULMONIC VALVE AV Area (Vmax): 1.95 cm    PV Vmax:       1.13 m/s AV Vmax:        124.00 cm/s PV Peak grad:  5.1 mmHg AV Peak Grad:   6.2 mmHg LVOT Vmax:      69.80 cm/s  AORTA Ao Root diam: 2.80 cm MITRAL VALVE                TRICUSPID VALVE MV Area (PHT):  6.37 cm     TR Peak grad:   37.9 mmHg MV Decel Time: 119 msec     TR Vmax:        308.00 cm/s MV E velocity: 111.00 cm/s                             SHUNTS                             Systemic Diam: 2.10 cm Nelva Bush MD Electronically signed by Nelva Bush MD  Signature Date/Time: 10/02/2020/7:17:48 AM    Final    IR NEPHROSTOMY PLACEMENT LEFT  Result Date: 10/01/2020 INDICATION: 87 year old male presenting with presumed urinary sepsis and non ST elevated myocardial infarction, bladder mass suspected. EXAM: 1. ULTRASOUND GUIDANCE FOR PUNCTURE OF THE LEFT RENAL COLLECTING SYSTEM 2. LEFT PERCUTANEOUS NEPHROSTOMY TUBE PLACEMENT. COMPARISON:  None. MEDICATIONS: The patient is receiving appropriate inpatient antibiotic coverage prior to procedure. ANESTHESIA/SEDATION: Moderate (conscious) sedation was employed during this procedure. A total of Versed 0 mg and Fentanyl 25 mcg was administered intravenously. Moderate Sedation Time: 0 minutes. The patient's level of consciousness and vital signs were monitored continuously by radiology nursing throughout the procedure under my direct supervision. CONTRAST:  Twenty mL Isovue 300 - administered into the renal collecting system FLUOROSCOPY TIME:  0.8 minutes, 123456 mGy COMPLICATIONS: None immediate. PROCEDURE: The procedure, risks, benefits, and alternatives were explained to the patient. Questions regarding the procedure were encouraged and answered. The patient understands and consents to the procedure. A timeout was performed prior to the initiation of the procedure. The left flank region was prepped and draped in the usual sterile fashion and a sterile drape was applied covering the operative field. A sterile gown and sterile gloves were used for the procedure. Local anesthesia was provided with 1% Lidocaine. Ultrasound was used to localize the left kidney. Under direct ultrasound guidance, a 20 gauge needle was advanced into the renal collecting system. An ultrasound image documentation was performed. Access within the collecting system was confirmed with the efflux of urine followed by limited contrast injection. Over a Nitrex wire, the tract was dilated with an Accustick stent. Next, under intermittent fluoroscpic guidance  and over a short Amplatz wire, the track was dilated ultimately allowing placement of a 10.2 percutaneous nephrostomy catheter which was advanced to the level of the renal pelvis where the coil was formed and locked. Contrast was injected and several spot fluoroscopic images were obtained in various obliquities. The catheter was secured at the skin with a 0 silk retention suture and stat lock device and connected to a gravity bag was placed. Dressings were applied. The patient tolerated procedure well without immediate postprocedural complication. FINDINGS: Ultrasound scanning demonstrates a moderately dilated left collecting system. Under a combination of ultrasound and fluoroscopic guidance, a posterior inferior calix was targeted allowing placement of a 10.2 percutaneous nephrostomy catheter with end coiled and locked within the renal pelvis. Contrast injection confirmed appropriate positioning. Contrast does not travel beyond the mid ureter. IMPRESSION: Successful ultrasound and fluoroscopic guided placement of a left sided 10.2 [place] PCN. PLAN: Interventional Radiology will arrange for routine check and exchange of indwelling nephrostomy in 4-6 weeks. Ruthann Cancer, MD [facility] Radiology Electronically Signed   By: Ruthann Cancer MD   On: 10/01/2020 16:21    Microbiology: Recent Results (from the past 240 hour(s))  Urine  Culture     Status: Abnormal   Collection Time: 10/01/20 11:24 AM   Specimen: Urine, Random  Result Value Ref Range Status   Specimen Description   Final    URINE, RANDOM Performed at [facility], [address], [place], [place] [address]    Special Requests   Final    NONE Performed at [facility], [address] [place], [place] [address]    Culture >=100,000 COLONIES/mL KLEBSIELLA PNEUMONIAE (A)  Final   Report Status 10/03/2020 FINAL  Final   Organism ID, Bacteria KLEBSIELLA PNEUMONIAE (A)  Final       Susceptibility   Klebsiella pneumoniae - MIC*    AMPICILLIN RESISTANT Resistant     CEFAZOLIN <=4 SENSITIVE Sensitive     CEFEPIME <=0.12 SENSITIVE Sensitive     CEFTRIAXONE <=0.25 SENSITIVE Sensitive     CIPROFLOXACIN <=0.25 SENSITIVE Sensitive     GENTAMICIN <=1 SENSITIVE Sensitive     IMIPENEM <=0.25 SENSITIVE Sensitive     NITROFURANTOIN 32 SENSITIVE Sensitive     TRIMETH/SULFA <=20 SENSITIVE Sensitive     AMPICILLIN/SULBACTAM 4 SENSITIVE Sensitive     PIP/TAZO <=4 SENSITIVE Sensitive     * >=100,000 COLONIES/mL KLEBSIELLA PNEUMONIAE  Blood culture (routine x 2)     Status: Abnormal   Collection Time: 10/01/20 11:24 AM   Specimen: BLOOD  Result Value Ref Range Status   Specimen Description   Final    BLOOD RIGHT ANTECUBITAL Performed at [facility], [address] [place], [place] [address]    Special Requests   Final    BOTTLES DRAWN AEROBIC AND ANAEROBIC Blood Culture adequate volume Performed at [facility], [address] [place] , [place] [address]    Culture  Setup Time   Final    GRAM NEGATIVE RODS IN BOTH AEROBIC AND ANAEROBIC BOTTLES CRITICAL RESULT CALLED TO, READ BACK BY AND VERIFIED WITH: JASON ROBBINS '@0255'$  10/02/20 RH Performed at [facility], [address] [place], [place] [address]    Culture KLEBSIELLA PNEUMONIAE (A)  Final   Report Status 10/05/2020 FINAL  Final   Organism ID, Bacteria KLEBSIELLA PNEUMONIAE  Final      Susceptibility   Klebsiella pneumoniae - MIC*    AMPICILLIN >=32 RESISTANT Resistant     CEFAZOLIN <=4 SENSITIVE Sensitive     CEFEPIME <=0.12 SENSITIVE Sensitive     CEFTAZIDIME <=1 SENSITIVE Sensitive     CEFTRIAXONE <=0.25 SENSITIVE Sensitive     CIPROFLOXACIN <=0.25 SENSITIVE Sensitive     GENTAMICIN <=1 SENSITIVE Sensitive     IMIPENEM <=0.25 SENSITIVE Sensitive     TRIMETH/SULFA <=20 SENSITIVE Sensitive     AMPICILLIN/SULBACTAM 4 SENSITIVE Sensitive     PIP/TAZO <=4 SENSITIVE Sensitive      * KLEBSIELLA PNEUMONIAE  Blood Culture ID Panel (Reflexed)     Status: Abnormal   Collection Time: 10/01/20 11:24 AM  Result Value Ref Range Status   Enterococcus faecalis NOT DETECTED NOT DETECTED Final   Enterococcus Faecium NOT DETECTED NOT DETECTED Final   Listeria monocytogenes NOT DETECTED NOT DETECTED Final   Staphylococcus species NOT DETECTED NOT DETECTED Final   Staphylococcus aureus (BCID) NOT DETECTED NOT DETECTED Final   Staphylococcus epidermidis NOT DETECTED NOT DETECTED Final   Staphylococcus lugdunensis NOT DETECTED NOT DETECTED Final   Streptococcus species NOT DETECTED NOT DETECTED Final   Streptococcus agalactiae NOT DETECTED NOT DETECTED Final   Streptococcus pneumoniae NOT DETECTED NOT DETECTED Final   Streptococcus pyogenes NOT  DETECTED NOT DETECTED Final   A.calcoaceticus-baumannii NOT DETECTED NOT DETECTED Final   Bacteroides fragilis NOT DETECTED NOT DETECTED Final   Enterobacterales DETECTED (A) NOT DETECTED Final    Comment: Enterobacterales represent a large order of gram negative bacteria, not a single organism. CRITICAL RESULT CALLED TO, READ BACK BY AND VERIFIED WITH: JASON ROBBINS '@[id number]'$  10/02/20 RH    Enterobacter cloacae complex NOT DETECTED NOT DETECTED Final   Escherichia coli NOT DETECTED NOT DETECTED Final   Klebsiella aerogenes NOT DETECTED NOT DETECTED Final   Klebsiella oxytoca NOT DETECTED NOT DETECTED Final   Klebsiella pneumoniae DETECTED (A) NOT DETECTED Final    Comment: CRITICAL RESULT CALLED TO, READ BACK BY AND VERIFIED WITH: JASON ROBBINS '@[id number]'$  10/02/20 RH    Proteus species NOT DETECTED NOT DETECTED Final   Salmonella species NOT DETECTED NOT DETECTED Final   Serratia marcescens NOT DETECTED NOT DETECTED Final   Haemophilus influenzae NOT DETECTED NOT DETECTED Final   Neisseria meningitidis NOT DETECTED NOT DETECTED Final   Pseudomonas aeruginosa NOT DETECTED NOT DETECTED Final   Stenotrophomonas maltophilia NOT DETECTED NOT DETECTED  Final   Candida albicans NOT DETECTED NOT DETECTED Final   Candida auris NOT DETECTED NOT DETECTED Final   Candida glabrata NOT DETECTED NOT DETECTED Final   Candida krusei NOT DETECTED NOT DETECTED Final   Candida parapsilosis NOT DETECTED NOT DETECTED Final   Candida tropicalis NOT DETECTED NOT DETECTED Final   Cryptococcus neoformans/gattii NOT DETECTED NOT DETECTED Final   CTX-M ESBL NOT DETECTED NOT DETECTED Final   Carbapenem resistance IMP NOT DETECTED NOT DETECTED Final   Carbapenem resistance KPC NOT DETECTED NOT DETECTED Final   Carbapenem resistance NDM NOT DETECTED NOT DETECTED Final   Carbapenem resist OXA 48 LIKE NOT DETECTED NOT DETECTED Final   Carbapenem resistance VIM NOT DETECTED NOT DETECTED Final    Comment: Performed at [facility], [address] [place], [place] [address]  SARS CORONAVIRUS 2 (TAT 6-24 HRS) Nasopharyngeal Nasopharyngeal Swab     Status: None   Collection Time: 10/01/20 12:36 PM   Specimen: Nasopharyngeal Swab  Result Value Ref Range Status   SARS Coronavirus 2 NEGATIVE NEGATIVE Final    Comment: (NOTE) SARS-CoV-2 target nucleic acids are NOT DETECTED.  The SARS-CoV-2 RNA is generally detectable in upper and lower respiratory specimens during the acute phase of infection. Negative results do not preclude SARS-CoV-2 infection, do not rule out co-infections with other pathogens, and should not be used as the sole basis for treatment or other patient management decisions. Negative results must be combined with clinical observations, patient history, and epidemiological information. The expected result is Negative.  Fact Sheet for Patients: SugarRoll.be  Fact Sheet for Healthcare Providers: https://www.woods-mathews.com/  This test is not yet approved or cleared by the [place] FDA and  has been authorized for detection and/or diagnosis of SARS-CoV-2 by FDA under an Emergency Use  Authorization (EUA). This EUA will remain  in effect (meaning this test can be used) for the duration of the COVID-19 declaration under Se ction 564(b)(1) of the Act, 21 U.S.C. section 360bbb-3(b)(1), unless the authorization is terminated or revoked sooner.  Performed at [facility], [address] [place], [place] [address]   Blood culture (routine x 2)     Status: None   Collection Time: 10/01/20 12:46 PM   Specimen: BLOOD  Result Value Ref Range Status   Specimen Description BLOOD BOTTOM LEFT HAND  Final   Special Requests  Final    BOTTLES DRAWN AEROBIC AND ANAEROBIC Blood Culture adequate volume   Culture   Final    NO GROWTH 5 DAYS Performed at [facility], [address] [place], [place] [address]    Report Status 10/06/2020 FINAL  Final  Urine culture     Status: Abnormal   Collection Time: 10/01/20  4:08 PM   Specimen: Urine, Catheterized  Result Value Ref Range Status   Specimen Description URINE, CATHETERIZED  Final   Special Requests   Final    SYRINGE Performed at [facility], [address], [place], [place] [address]    Culture >=100,000 COLONIES/mL KLEBSIELLA PNEUMONIAE (A)  Final   Report Status 10/04/2020 FINAL  Final   Organism ID, Bacteria KLEBSIELLA PNEUMONIAE (A)  Final      Susceptibility   Klebsiella pneumoniae - MIC*    AMPICILLIN >=32 RESISTANT Resistant     CEFAZOLIN <=4 SENSITIVE Sensitive     CEFEPIME <=0.12 SENSITIVE Sensitive     CEFTRIAXONE <=0.25 SENSITIVE Sensitive     CIPROFLOXACIN <=0.25 SENSITIVE Sensitive     GENTAMICIN <=1 SENSITIVE Sensitive     IMIPENEM <=0.25 SENSITIVE Sensitive     NITROFURANTOIN 64 INTERMEDIATE Intermediate     TRIMETH/SULFA <=20 SENSITIVE Sensitive     AMPICILLIN/SULBACTAM 8 SENSITIVE Sensitive     PIP/TAZO <=4 SENSITIVE Sensitive     * >=100,000 COLONIES/mL KLEBSIELLA PNEUMONIAE     Labs: Basic Metabolic Panel: Recent Labs  Lab 10/01/20 1124 10/02/20 1034 10/03/20 0626  10/04/20 0604 10/05/20 0439  NA 140 138 142 141 141  K 4.2 4.8 4.6 4.2 3.9  CL 104 104 107 108 108  CO2 23 21* '24 24 23  '$ GLUCOSE 184* 172* 132* 188* 196*  BUN 48* 58* 69* 74* 72*  CREATININE 3.98* 4.03* 3.89* 3.69* 3.48*  CALCIUM 8.2* 7.6* 7.6* 7.5* 7.4*  MG  --   --  2.4  --   --    Liver Function Tests: Recent Labs  Lab 10/01/20 1124 10/04/20 0604 10/05/20 0439  AST 19 11* 12*  ALT 7 5 <5  ALKPHOS 54 48 49  BILITOT 0.7 0.5 0.4  PROT 6.5 5.5* 5.3*  ALBUMIN 2.7* 2.0* 2.1*   No results for input(s): LIPASE, AMYLASE in the last 168 hours. No results for input(s): AMMONIA in the last 168 hours. CBC: Recent Labs  Lab 10/01/20 1124 10/02/20 0725 10/02/20 1124 10/03/20 0626 10/03/20 1405 10/04/20 0604 10/05/20 0439  WBC 19.5* 10.9* 9.8 7.7  --  7.3 7.7  NEUTROABS 18.0*  --  8.5* 6.5  --   --   --   HGB 9.7* 8.0* 8.7* 7.4* 7.9* 8.0* 8.9*  HCT 29.0* 25.0* 26.7* 23.1*  --  24.2* 27.3*  MCV 91.2 91.6 92.4 92.8  --  91.3 91.9  PLT 237 196 176 172  --  189 196   Cardiac Enzymes: No results for input(s): CKTOTAL, CKMB, CKMBINDEX, TROPONINI in the last 168 hours. BNP: BNP (last 3 results) Recent Labs    10/01/20 1124  BNP 2,334.2*    ProBNP (last 3 results) No results for input(s): PROBNP in the last 8760 hours.  CBG: Recent Labs  Lab 10/04/20 1622 10/04/20 2051 10/05/20 0825 10/05/20 1155 10/05/20 1620  GLUCAP 138* 150* 207* 178* 130*       Signed:  Desma Maxim MD.  [facility] 10/06/2020, 1:15 PM

## 2020-10-06 NOTE — TOC Transition Note (Signed)
Transition of Care [facility]) - CM/SW Discharge Note   Patient Details  Name: Ian Jimenez MRN: [id number] Date of Birth: 19-Jan-1934  Transition of Care [facility] CM/SW Contact:  Kerin [place], RN Phone Number: 10/06/2020, 2:39 PM   Final next level of care: [place] Barriers to Discharge: Other (comment) (Discharged to [facility])   Patient Goals and CMS Choice Patient states their goals for this hospitalization and ongoing recovery are:: To Hospice home.      Discharge Placement                Patient to be transferred to facility by: [facility] Name of family member notified: [place] to notify family Patient and family notified of of transfer: 10/06/20  Discharge Plan and Services                DME Arranged: N/A DME Agency: NA       HH Arranged: NA HH Agency: NA        Social Determinants of Health (SDOH) Interventions     Readmission Risk Interventions No flowsheet data found.

## 2020-10-06 NOTE — Progress Notes (Signed)
PROGRESS NOTE    Rajiv Suppes  [id number] DOB: 08/26/1933 DOA: 10/01/2020 PCP: Albina Billet, MD   Chief complaint.  Altered mental status. Brief Narrative:  Ian Jimenez is a 87 y.o. male with medical history significant of HTN, HLD, stroke/TIA, dementia, CKD-III, depression, presents with AMS, hematuria. CT abdomen/pelvis showed a possible blood clots in the bladder, with the possibility of bladder mass.  Patient also has hydronephrosis.  Emergent nephrostomy was performed by IR.  Urine cultures so far growing gram-negative rods, blood culture positive for Klebsiella pneumonia.  Patient has been treated with Rocephin. Patient also had elevated troponin, peaked at 2102.  Has been seen by cardiology, not a good candidate for heart cath due to multiple medical problems including renal failure, and bacteremia.   Assessment & Plan:   Principal Problem:   Acute pyelonephritis Active Problems:   TIA (transient ischemic attack)   Hypertension associated with diabetes ([place])   Depression due to dementia [facility])   NSTEMI (non-ST elevated myocardial infarction) ([place])   Type II diabetes mellitus with renal manifestations ([place])   Acute renal failure superimposed on stage 3a chronic kidney disease ([place])   HLD (hyperlipidemia)   Dementia (HCC)   Hydronephrosis of left kidney   Acute respiratory failure with hypoxia (HCC)   Severe sepsis with septic shock ([place])   Septicemia due to Klebsiella pneumoniae (HCC)   HFrEF (heart failure with reduced ejection fraction) ([place])  # Severe sepsis with septic shock secondary to pyelonephritis. # Klebsiella pneumoniae bactermia # Acute pyelonephritis secondary to obstruction secondary to Klebsiella pneumonia. # Left hydronephrosis likely secondary to bladder mass. Patient is status post left nephrostomy. transitioned to comfort care  # Non-STEMI. Acute systolic congestive heart failure with ejection fraction 25 to 30%. Moderate pulmonary  hypertension. Acute hypoxemic respiratory failure. Patient has significant elevation of troponin, followed by cardiology. He is not a good candidate for heart cath due to medical conditions. Patient had echocardiogram from 2019, ejection fraction was normal. I am not certain about the patient volume status, she has elevated BNP, but this is associated with worsening renal function.  Chest x-ray does not seem to have increased pulmonary edema. - transitioned to comfort care  #.  Acute kidney injury on chronic kidney disease stage IIIa versus stage IV. Due to patient previous record, new early 2020, creatinine was 1.5, later the year creatinine increased to 3.5, this might reflect an obstruction from bladder mass. Patient is status post left nephrostomy, will monitor renal function until patient renal function reaches baseline. No hyperkalemia or acidosis. Kidney function slowly improving - comfort care  #   Type 2 diabetes. Well controlled.  # .  Essential hypertension. Controlled  #  Parkinson disease with dementia. Continue home medicines.   n Code Status: DNR Family Communication: daughter updated @ bedside 3/4 Disposition Plan: inpt hospice, may have bed today .   Status is: Inpatient  Remains inpatient appropriate because:Inpatient level of care appropriate due to severity of illness   Dispo: The patient is from: Home              Anticipated d/c is to: inpt hospice              Patient currently is medically stable to d/c.   Difficult to place patient No        I/O last 3 completed shifts: In: 1421.1 [I.V.:1321.1; IV Piggyback:100] Out: 500 [Urine:200; Drains:300] Total I/O In: -  Out: 550 [Drains:550]     Consultants:  Cardiology, nephrology, urologist  Procedures: Left nephrostomy.  Antimicrobials:  Rocephin  Subjective: Asleep  Objective: Vitals:   10/05/20 0813 10/05/20 1220 10/05/20 1602 10/05/20 2100  BP: (!) 155/79 124/61 (!) 146/64    Pulse: 67 (!) 59 (!) 57   Resp: '18 19 19   '$ Temp: 98.6 F (37 C) 97.8 F (36.6 C) 97.7 F (36.5 C)   TempSrc: Oral  Oral   SpO2: 98% 96% 98%   Weight:    74.7 kg  Height:        Intake/Output Summary (Last 24 hours) at 10/06/2020 1208 Last data filed at 10/06/2020 0900 Gross per 24 hour  Intake 100 ml  Output 925 ml  Net -825 ml   Filed Weights   10/04/20 0500 10/05/20 0502 10/05/20 2100  Weight: 70.2 kg 74 kg 74.7 kg    Examination:  General exam: Appears calm and comfortable, ill appearing  Respiratory system: shallow respirations. Cardiovascular system: rrr Gastrointestinal system: Abdomen is mildly distended, soft Central nervous system: sleeping Extremities: Symmetric  Skin: No rashes, lesions or ulcers. Pale      Data Reviewed: I have personally reviewed following labs and imaging studies  CBC: Recent Labs  Lab 10/01/20 1124 10/02/20 0725 10/02/20 1124 10/03/20 0626 10/03/20 1405 10/04/20 0604 10/05/20 0439  WBC 19.5* 10.9* 9.8 7.7  --  7.3 7.7  NEUTROABS 18.0*  --  8.5* 6.5  --   --   --   HGB 9.7* 8.0* 8.7* 7.4* 7.9* 8.0* 8.9*  HCT 29.0* 25.0* 26.7* 23.1*  --  24.2* 27.3*  MCV 91.2 91.6 92.4 92.8  --  91.3 91.9  PLT 237 196 176 172  --  [id number]   Basic Metabolic Panel: Recent Labs  Lab 10/01/20 1124 10/02/20 1034 10/03/20 0626 10/04/20 0604 10/05/20 0439  NA 140 138 142 141 141  K 4.2 4.8 4.6 4.2 3.9  CL 104 104 107 108 108  CO2 23 21* '24 24 23  '$ GLUCOSE 184* 172* 132* 188* 196*  BUN 48* 58* 69* 74* 72*  CREATININE 3.98* 4.03* 3.89* 3.69* 3.48*  CALCIUM 8.2* 7.6* 7.6* 7.5* 7.4*  MG  --   --  2.4  --   --    GFR: Estimated Creatinine Clearance: 15.8 mL/min (A) (by C-G formula based on SCr of 3.48 mg/dL (H)). Liver Function Tests: Recent Labs  Lab 10/01/20 1124 10/04/20 0604 10/05/20 0439  AST 19 11* 12*  ALT 7 5 <5  ALKPHOS 54 48 49  BILITOT 0.7 0.5 0.4  PROT 6.5 5.5* 5.3*  ALBUMIN 2.7* 2.0* 2.1*   No results for input(s):  LIPASE, AMYLASE in the last 168 hours. No results for input(s): AMMONIA in the last 168 hours. Coagulation Profile: Recent Labs  Lab 10/01/20 1124  INR 1.2   Cardiac Enzymes: No results for input(s): CKTOTAL, CKMB, CKMBINDEX, TROPONINI in the last 168 hours. BNP (last 3 results) No results for input(s): PROBNP in the last 8760 hours. HbA1C: No results for input(s): HGBA1C in the last 72 hours. CBG: Recent Labs  Lab 10/04/20 1622 10/04/20 2051 10/05/20 0825 10/05/20 1155 10/05/20 1620  GLUCAP 138* 150* 207* 178* 130*   Lipid Profile: No results for input(s): CHOL, HDL, LDLCALC, TRIG, CHOLHDL, LDLDIRECT in the last 72 hours. Thyroid Function Tests: No results for input(s): TSH, T4TOTAL, FREET4, T3FREE, THYROIDAB in the last 72 hours. Anemia Panel: No results for input(s): VITAMINB12, FOLATE, FERRITIN, TIBC, IRON, RETICCTPCT in the last 72 hours. Sepsis Labs: Recent Labs  Lab 10/01/20 1105 10/01/20 1525 10/01/20 2333 10/02/20 0145  PROCALCITON  --  71.94  --   --   LATICACIDVEN 5.1* 3.7* 2.9* 1.9    Recent Results (from the past 240 hour(s))  Urine Culture     Status: Abnormal   Collection Time: 10/01/20 11:24 AM   Specimen: Urine, Random  Result Value Ref Range Status   Specimen Description   Final    URINE, RANDOM Performed at [facility], [address] [place], [place] [address]    Special Requests   Final    NONE Performed at [facility], [address] [place], [place] [address]    Culture >=100,000 COLONIES/mL KLEBSIELLA PNEUMONIAE (A)  Final   Report Status 10/03/2020 FINAL  Final   Organism ID, Bacteria KLEBSIELLA PNEUMONIAE (A)  Final      Susceptibility   Klebsiella pneumoniae - MIC*    AMPICILLIN RESISTANT Resistant     CEFAZOLIN <=4 SENSITIVE Sensitive     CEFEPIME <=0.12 SENSITIVE Sensitive     CEFTRIAXONE <=0.25 SENSITIVE Sensitive     CIPROFLOXACIN <=0.25 SENSITIVE Sensitive     GENTAMICIN <=1 SENSITIVE Sensitive      IMIPENEM <=0.25 SENSITIVE Sensitive     NITROFURANTOIN 32 SENSITIVE Sensitive     TRIMETH/SULFA <=20 SENSITIVE Sensitive     AMPICILLIN/SULBACTAM 4 SENSITIVE Sensitive     PIP/TAZO <=4 SENSITIVE Sensitive     * >=100,000 COLONIES/mL KLEBSIELLA PNEUMONIAE  Blood culture (routine x 2)     Status: Abnormal   Collection Time: 10/01/20 11:24 AM   Specimen: BLOOD  Result Value Ref Range Status   Specimen Description   Final    BLOOD RIGHT ANTECUBITAL Performed at [facility], [address], [place], [place] [address]    Special Requests   Final    BOTTLES DRAWN AEROBIC AND ANAEROBIC Blood Culture adequate volume Performed at [facility], [address] [place], [place] [address]    Culture  Setup Time   Final    GRAM NEGATIVE RODS IN BOTH AEROBIC AND ANAEROBIC BOTTLES CRITICAL RESULT CALLED TO, READ BACK BY AND VERIFIED WITH: JASON ROBBINS '@0255'$  10/02/20 RH Performed at [facility], [place] [address], [place],  [address]    Culture KLEBSIELLA PNEUMONIAE (A)  Final   Report Status 10/05/2020 FINAL  Final   Organism ID, Bacteria KLEBSIELLA PNEUMONIAE  Final      Susceptibility   Klebsiella pneumoniae - MIC*    AMPICILLIN >=32 RESISTANT Resistant     CEFAZOLIN <=4 SENSITIVE Sensitive     CEFEPIME <=0.12 SENSITIVE Sensitive     CEFTAZIDIME <=1 SENSITIVE Sensitive     CEFTRIAXONE <=0.25 SENSITIVE Sensitive     CIPROFLOXACIN <=0.25 SENSITIVE Sensitive     GENTAMICIN <=1 SENSITIVE Sensitive     IMIPENEM <=0.25 SENSITIVE Sensitive     TRIMETH/SULFA <=20 SENSITIVE Sensitive     AMPICILLIN/SULBACTAM 4 SENSITIVE Sensitive     PIP/TAZO <=4 SENSITIVE Sensitive     * KLEBSIELLA PNEUMONIAE  Blood Culture ID Panel (Reflexed)     Status: Abnormal   Collection Time: 10/01/20 11:24 AM  Result Value Ref Range Status   Enterococcus faecalis NOT DETECTED NOT DETECTED Final   Enterococcus Faecium NOT DETECTED NOT DETECTED Final   Listeria monocytogenes NOT DETECTED  NOT DETECTED Final   Staphylococcus species NOT DETECTED NOT DETECTED Final   Staphylococcus aureus (BCID) NOT DETECTED NOT DETECTED Final   Staphylococcus epidermidis NOT DETECTED NOT DETECTED Final  Staphylococcus lugdunensis NOT DETECTED NOT DETECTED Final   Streptococcus species NOT DETECTED NOT DETECTED Final   Streptococcus agalactiae NOT DETECTED NOT DETECTED Final   Streptococcus pneumoniae NOT DETECTED NOT DETECTED Final   Streptococcus pyogenes NOT DETECTED NOT DETECTED Final   A.calcoaceticus-baumannii NOT DETECTED NOT DETECTED Final   Bacteroides fragilis NOT DETECTED NOT DETECTED Final   Enterobacterales DETECTED (A) NOT DETECTED Final    Comment: Enterobacterales represent a large order of gram negative bacteria, not a single organism. CRITICAL RESULT CALLED TO, READ BACK BY AND VERIFIED WITH: JASON ROBBINS '@0255'$  10/02/20 RH    Enterobacter cloacae complex NOT DETECTED NOT DETECTED Final   Escherichia coli NOT DETECTED NOT DETECTED Final   Klebsiella aerogenes NOT DETECTED NOT DETECTED Final   Klebsiella oxytoca NOT DETECTED NOT DETECTED Final   Klebsiella pneumoniae DETECTED (A) NOT DETECTED Final    Comment: CRITICAL RESULT CALLED TO, READ BACK BY AND VERIFIED WITH: JASON ROBBINS '@0255'$  10/02/20 RH    Proteus species NOT DETECTED NOT DETECTED Final   Salmonella species NOT DETECTED NOT DETECTED Final   Serratia marcescens NOT DETECTED NOT DETECTED Final   Haemophilus influenzae NOT DETECTED NOT DETECTED Final   Neisseria meningitidis NOT DETECTED NOT DETECTED Final   Pseudomonas aeruginosa NOT DETECTED NOT DETECTED Final   Stenotrophomonas maltophilia NOT DETECTED NOT DETECTED Final   Candida albicans NOT DETECTED NOT DETECTED Final   Candida auris NOT DETECTED NOT DETECTED Final   Candida glabrata NOT DETECTED NOT DETECTED Final   Candida krusei NOT DETECTED NOT DETECTED Final   Candida parapsilosis NOT DETECTED NOT DETECTED Final   Candida tropicalis NOT DETECTED NOT  DETECTED Final   Cryptococcus neoformans/gattii NOT DETECTED NOT DETECTED Final   CTX-M ESBL NOT DETECTED NOT DETECTED Final   Carbapenem resistance IMP NOT DETECTED NOT DETECTED Final   Carbapenem resistance KPC NOT DETECTED NOT DETECTED Final   Carbapenem resistance NDM NOT DETECTED NOT DETECTED Final   Carbapenem resist OXA 48 LIKE NOT DETECTED NOT DETECTED Final   Carbapenem resistance VIM NOT DETECTED NOT DETECTED Final    Comment: Performed at [facility], [address] [place], [place] [address]  SARS CORONAVIRUS 2 (TAT 6-24 HRS) Nasopharyngeal Nasopharyngeal Swab     Status: None   Collection Time: 10/01/20 12:36 PM   Specimen: Nasopharyngeal Swab  Result Value Ref Range Status   SARS Coronavirus 2 NEGATIVE NEGATIVE Final    Comment: (NOTE) SARS-CoV-2 target nucleic acids are NOT DETECTED.  The SARS-CoV-2 RNA is generally detectable in upper and lower respiratory specimens during the acute phase of infection. Negative results do not preclude SARS-CoV-2 infection, do not rule out co-infections with other pathogens, and should not be used as the sole basis for treatment or other patient management decisions. Negative results must be combined with clinical observations, patient history, and epidemiological information. The expected result is Negative.  Fact Sheet for Patients: SugarRoll.be  Fact Sheet for Healthcare Providers: https://www.woods-mathews.com/  This test is not yet approved or cleared by the [place] FDA and  has been authorized for detection and/or diagnosis of SARS-CoV-2 by FDA under an Emergency Use Authorization (EUA). This EUA will remain  in effect (meaning this test can be used) for the duration of the COVID-19 declaration under Se ction 564(b)(1) of the Act, 21 U.S.C. section 360bbb-3(b)(1), unless the authorization is terminated or revoked sooner.  Performed at [facility], [place] [address] [place], [place] [address]   Blood culture (routine x 2)  Status: None   Collection Time: 10/01/20 12:46 PM   Specimen: BLOOD  Result Value Ref Range Status   Specimen Description BLOOD BOTTOM LEFT HAND  Final   Special Requests   Final    BOTTLES DRAWN AEROBIC AND ANAEROBIC Blood Culture adequate volume   Culture   Final    NO GROWTH 5 DAYS Performed at [facility], [address], [place], [place] [address]    Report Status 10/06/2020 FINAL  Final  Urine culture     Status: Abnormal   Collection Time: 10/01/20  4:08 PM   Specimen: Urine, Catheterized  Result Value Ref Range Status   Specimen Description URINE, CATHETERIZED  Final   Special Requests   Final    SYRINGE Performed at [facility], [place] [address], [place], [place] [address]    Culture >=100,000 COLONIES/mL KLEBSIELLA PNEUMONIAE (A)  Final   Report Status 10/04/2020 FINAL  Final   Organism ID, Bacteria KLEBSIELLA PNEUMONIAE (A)  Final      Susceptibility   Klebsiella pneumoniae - MIC*    AMPICILLIN >=32 RESISTANT Resistant     CEFAZOLIN <=4 SENSITIVE Sensitive     CEFEPIME <=0.12 SENSITIVE Sensitive     CEFTRIAXONE <=0.25 SENSITIVE Sensitive     CIPROFLOXACIN <=0.25 SENSITIVE Sensitive     GENTAMICIN <=1 SENSITIVE Sensitive     IMIPENEM <=0.25 SENSITIVE Sensitive     NITROFURANTOIN 64 INTERMEDIATE Intermediate     TRIMETH/SULFA <=20 SENSITIVE Sensitive     AMPICILLIN/SULBACTAM 8 SENSITIVE Sensitive     PIP/TAZO <=4 SENSITIVE Sensitive     * >=100,000 COLONIES/mL KLEBSIELLA PNEUMONIAE         Radiology Studies: US RENAL  Result Date: 10/04/2020 CLINICAL DATA:  Acute kidney injury. EXAM: RENAL / URINARY TRACT ULTRASOUND COMPLETE COMPARISON:  July 12, 2018.  October 01, 2020. FINDINGS: Right Kidney: Renal measurements: 8.8 x 5.3 x 5.0 cm = volume: 121 mL. Increased echogenicity of renal parenchyma is noted suggesting medical renal disease. No mass or hydronephrosis visualized.  Left Kidney: Renal measurements: 10.8 x 5.7 x 5.5 cm = volume: 176 mL. Increased echogenicity of renal parenchyma is noted suggesting medical renal disease. No mass or hydronephrosis visualized. Bladder: 7.2 x 6.3 x 4.9 cm multilobulated abnormality is seen in the inferior portion of the urinary bladder concerning for neoplasm or malignancy. Other: None. IMPRESSION: Increased echogenicity of renal parenchyma is noted suggesting medical renal disease. 7.2 x 6.3 x 4.9 cm multilobulated abnormality is seen in the inferior portion of the urinary bladder concerning for neoplasm or malignancy. Cystoscopy is recommended for further evaluation. Electronically Signed   By: Marijo Conception M.D.   On: 10/04/2020 13:47        Scheduled Meds: . divalproex  250 mg Oral BID  . sodium chloride flush  3 mL Intravenous Q12H  . sodium chloride flush  5 mL Intracatheter Q8H   Continuous Infusions:    LOS: 5 days    Time spent: 25 minutes    Desma Maxim, MD [facility]   To contact the attending provider between 7A-7P or the covering provider during after hours 7P-7A, please log into the web site [email or web address] and access using universal [place] password for that web site. If you do not have the password, please call the hospital operator.  10/06/2020, 12:08 PM

## 2020-11-02 DEATH — deceased
